# Patient Record
Sex: Male | Born: 1950 | Race: White | Hispanic: No | Marital: Married | Smoking: Former smoker
Health system: Southern US, Community
[De-identification: ages and names within clinical notes are randomized; demographics above are authoritative.]

## PROBLEM LIST (undated history)

## (undated) DIAGNOSIS — G8929 Other chronic pain: Secondary | ICD-10-CM

## (undated) DIAGNOSIS — I2510 Atherosclerotic heart disease of native coronary artery without angina pectoris: Secondary | ICD-10-CM

## (undated) DIAGNOSIS — M545 Low back pain: Secondary | ICD-10-CM

## (undated) DIAGNOSIS — R011 Cardiac murmur, unspecified: Secondary | ICD-10-CM

## (undated) DIAGNOSIS — I739 Peripheral vascular disease, unspecified: Secondary | ICD-10-CM

## (undated) DIAGNOSIS — J309 Allergic rhinitis, unspecified: Secondary | ICD-10-CM

## (undated) DIAGNOSIS — R310 Gross hematuria: Secondary | ICD-10-CM

## (undated) DIAGNOSIS — K5730 Diverticulosis of large intestine without perforation or abscess without bleeding: Secondary | ICD-10-CM

## (undated) DIAGNOSIS — R269 Unspecified abnormalities of gait and mobility: Secondary | ICD-10-CM

## (undated) DIAGNOSIS — R1084 Generalized abdominal pain: Secondary | ICD-10-CM

## (undated) DIAGNOSIS — I10 Essential (primary) hypertension: Secondary | ICD-10-CM

## (undated) DIAGNOSIS — Z86010 Personal history of colonic polyps: Secondary | ICD-10-CM

## (undated) DIAGNOSIS — F17200 Nicotine dependence, unspecified, uncomplicated: Secondary | ICD-10-CM

## (undated) DIAGNOSIS — R51 Headache: Secondary | ICD-10-CM

## (undated) DIAGNOSIS — G603 Idiopathic progressive neuropathy: Secondary | ICD-10-CM

## (undated) DIAGNOSIS — M1711 Unilateral primary osteoarthritis, right knee: Secondary | ICD-10-CM

## (undated) DIAGNOSIS — G5602 Carpal tunnel syndrome, left upper limb: Secondary | ICD-10-CM

## (undated) DIAGNOSIS — E785 Hyperlipidemia, unspecified: Secondary | ICD-10-CM

## (undated) DIAGNOSIS — M549 Dorsalgia, unspecified: Secondary | ICD-10-CM

## (undated) DIAGNOSIS — T7840XA Allergy, unspecified, initial encounter: Secondary | ICD-10-CM

## (undated) HISTORY — DX: Dorsalgia, unspecified: M54.9

## (undated) HISTORY — PX: OTHER SURGICAL HISTORY: SHX169

## (undated) HISTORY — PX: POLYPECTOMY: SHX149

## (undated) HISTORY — DX: Diverticulosis of large intestine without perforation or abscess without bleeding: K57.30

## (undated) HISTORY — DX: Generalized abdominal pain: R10.84

## (undated) HISTORY — DX: Low back pain: M54.5

## (undated) HISTORY — PX: COLONOSCOPY W/ BIOPSIES AND POLYPECTOMY: SHX1376

## (undated) HISTORY — DX: Nicotine dependence, unspecified, uncomplicated: F17.200

## (undated) HISTORY — DX: Unilateral primary osteoarthritis, right knee: M17.11

## (undated) HISTORY — DX: Idiopathic progressive neuropathy: G60.3

## (undated) HISTORY — DX: Essential (primary) hypertension: I10

## (undated) HISTORY — DX: Unspecified abnormalities of gait and mobility: R26.9

## (undated) HISTORY — DX: Other chronic pain: G89.29

## (undated) HISTORY — DX: Peripheral vascular disease, unspecified: I73.9

## (undated) HISTORY — DX: Atherosclerotic heart disease of native coronary artery without angina pectoris: I25.10

## (undated) HISTORY — DX: Gross hematuria: R31.0

## (undated) HISTORY — DX: Allergic rhinitis, unspecified: J30.9

## (undated) HISTORY — DX: Allergy, unspecified, initial encounter: T78.40XA

## (undated) HISTORY — PX: COLONOSCOPY: SHX174

## (undated) HISTORY — DX: Personal history of colonic polyps: Z86.010

## (undated) HISTORY — DX: Hyperlipidemia, unspecified: E78.5

## (undated) HISTORY — PX: CARDIAC CATHETERIZATION: SHX172

---

## 2000-02-15 ENCOUNTER — Encounter: Payer: Self-pay | Admitting: Emergency Medicine

## 2000-02-15 ENCOUNTER — Emergency Department (HOSPITAL_COMMUNITY): Admission: EM | Admit: 2000-02-15 | Discharge: 2000-02-15 | Payer: Self-pay

## 2000-02-26 ENCOUNTER — Emergency Department (HOSPITAL_COMMUNITY): Admission: EM | Admit: 2000-02-26 | Discharge: 2000-02-26 | Payer: Self-pay | Admitting: Emergency Medicine

## 2002-12-09 ENCOUNTER — Ambulatory Visit (HOSPITAL_COMMUNITY): Admission: RE | Admit: 2002-12-09 | Discharge: 2002-12-09 | Payer: Self-pay | Admitting: *Deleted

## 2002-12-09 ENCOUNTER — Encounter: Payer: Self-pay | Admitting: *Deleted

## 2004-04-23 ENCOUNTER — Ambulatory Visit (HOSPITAL_COMMUNITY): Admission: RE | Admit: 2004-04-23 | Discharge: 2004-04-24 | Payer: Self-pay | Admitting: Neurosurgery

## 2005-04-07 ENCOUNTER — Emergency Department (HOSPITAL_COMMUNITY): Admission: EM | Admit: 2005-04-07 | Discharge: 2005-04-07 | Payer: Self-pay | Admitting: Emergency Medicine

## 2006-01-23 ENCOUNTER — Ambulatory Visit: Payer: Self-pay | Admitting: Internal Medicine

## 2006-01-28 ENCOUNTER — Ambulatory Visit: Payer: Self-pay | Admitting: Internal Medicine

## 2006-02-19 ENCOUNTER — Ambulatory Visit: Payer: Self-pay | Admitting: Internal Medicine

## 2006-03-05 ENCOUNTER — Ambulatory Visit: Payer: Self-pay | Admitting: Internal Medicine

## 2006-03-05 ENCOUNTER — Encounter (INDEPENDENT_AMBULATORY_CARE_PROVIDER_SITE_OTHER): Payer: Self-pay | Admitting: *Deleted

## 2006-03-05 LAB — HM COLONOSCOPY: HM Colonoscopy: ABNORMAL

## 2006-04-10 ENCOUNTER — Encounter: Admission: RE | Admit: 2006-04-10 | Discharge: 2006-04-10 | Payer: Self-pay | Admitting: Neurosurgery

## 2006-11-15 ENCOUNTER — Ambulatory Visit (HOSPITAL_COMMUNITY): Admission: EM | Admit: 2006-11-15 | Discharge: 2006-11-15 | Payer: Self-pay | Admitting: Emergency Medicine

## 2007-06-09 ENCOUNTER — Encounter: Admission: RE | Admit: 2007-06-09 | Discharge: 2007-06-09 | Payer: Self-pay | Admitting: Neurosurgery

## 2007-06-24 ENCOUNTER — Inpatient Hospital Stay (HOSPITAL_COMMUNITY): Admission: RE | Admit: 2007-06-24 | Discharge: 2007-06-27 | Payer: Self-pay | Admitting: Neurosurgery

## 2008-04-04 ENCOUNTER — Ambulatory Visit (HOSPITAL_COMMUNITY): Admission: RE | Admit: 2008-04-04 | Discharge: 2008-04-04 | Payer: Self-pay | Admitting: Neurosurgery

## 2008-07-18 ENCOUNTER — Ambulatory Visit: Payer: Self-pay | Admitting: Internal Medicine

## 2008-07-18 DIAGNOSIS — Z860100 Personal history of colon polyps, unspecified: Secondary | ICD-10-CM

## 2008-07-18 DIAGNOSIS — E785 Hyperlipidemia, unspecified: Secondary | ICD-10-CM | POA: Insufficient documentation

## 2008-07-18 DIAGNOSIS — I2510 Atherosclerotic heart disease of native coronary artery without angina pectoris: Secondary | ICD-10-CM | POA: Insufficient documentation

## 2008-07-18 DIAGNOSIS — J309 Allergic rhinitis, unspecified: Secondary | ICD-10-CM

## 2008-07-18 DIAGNOSIS — R310 Gross hematuria: Secondary | ICD-10-CM | POA: Insufficient documentation

## 2008-07-18 DIAGNOSIS — K5730 Diverticulosis of large intestine without perforation or abscess without bleeding: Secondary | ICD-10-CM | POA: Insufficient documentation

## 2008-07-18 DIAGNOSIS — R1084 Generalized abdominal pain: Secondary | ICD-10-CM

## 2008-07-18 HISTORY — DX: Gross hematuria: R31.0

## 2008-07-18 HISTORY — DX: Diverticulosis of large intestine without perforation or abscess without bleeding: K57.30

## 2008-07-18 HISTORY — DX: Allergic rhinitis, unspecified: J30.9

## 2008-07-18 HISTORY — DX: Generalized abdominal pain: R10.84

## 2008-07-18 HISTORY — DX: Atherosclerotic heart disease of native coronary artery without angina pectoris: I25.10

## 2008-07-18 HISTORY — DX: Personal history of colon polyps, unspecified: Z86.0100

## 2008-07-18 HISTORY — DX: Personal history of colonic polyps: Z86.010

## 2008-07-18 HISTORY — DX: Hyperlipidemia, unspecified: E78.5

## 2008-07-19 ENCOUNTER — Ambulatory Visit: Payer: Self-pay | Admitting: Cardiology

## 2008-07-19 ENCOUNTER — Ambulatory Visit: Payer: Self-pay | Admitting: Internal Medicine

## 2008-07-19 LAB — CONVERTED CEMR LAB
ALT: 14 units/L (ref 0–53)
AST: 14 units/L (ref 0–37)
Albumin: 3.4 g/dL — ABNORMAL LOW (ref 3.5–5.2)
Alkaline Phosphatase: 48 units/L (ref 39–117)
BUN: 17 mg/dL (ref 6–23)
Bacteria, UA: NEGATIVE
Basophils Absolute: 0.1 10*3/uL (ref 0.0–0.1)
Basophils Relative: 0.8 % (ref 0.0–3.0)
Bilirubin Urine: NEGATIVE
Bilirubin, Direct: 0.1 mg/dL (ref 0.0–0.3)
CO2: 29 meq/L (ref 19–32)
Calcium: 8.9 mg/dL (ref 8.4–10.5)
Chloride: 107 meq/L (ref 96–112)
Creatinine, Ser: 0.9 mg/dL (ref 0.4–1.5)
Crystals: NEGATIVE
Eosinophils Absolute: 0.2 10*3/uL (ref 0.0–0.7)
Eosinophils Relative: 1.8 % (ref 0.0–5.0)
GFR calc Af Amer: 112 mL/min
GFR calc non Af Amer: 93 mL/min
Glucose, Bld: 100 mg/dL — ABNORMAL HIGH (ref 70–99)
HCT: 40.9 % (ref 39.0–52.0)
Hemoglobin, Urine: NEGATIVE
Hemoglobin: 14.1 g/dL (ref 13.0–17.0)
Ketones, ur: NEGATIVE mg/dL
Leukocytes, UA: NEGATIVE
Lymphocytes Relative: 26.4 % (ref 12.0–46.0)
MCHC: 34.5 g/dL (ref 30.0–36.0)
MCV: 87.3 fL (ref 78.0–100.0)
Monocytes Absolute: 1 10*3/uL (ref 0.1–1.0)
Monocytes Relative: 11.6 % (ref 3.0–12.0)
Mucus, UA: NEGATIVE
Neutro Abs: 5 10*3/uL (ref 1.4–7.7)
Neutrophils Relative %: 59.4 % (ref 43.0–77.0)
Nitrite: NEGATIVE
Platelets: 216 10*3/uL (ref 150–400)
Potassium: 4 meq/L (ref 3.5–5.1)
RBC / HPF: NONE SEEN
RBC: 4.68 M/uL (ref 4.22–5.81)
RDW: 13.3 % (ref 11.5–14.6)
Sodium: 141 meq/L (ref 135–145)
Specific Gravity, Urine: 1.025 (ref 1.000–1.03)
Total Bilirubin: 0.7 mg/dL (ref 0.3–1.2)
Total Protein, Urine: NEGATIVE mg/dL
Total Protein: 6.5 g/dL (ref 6.0–8.3)
Urine Glucose: NEGATIVE mg/dL
Urobilinogen, UA: 0.2 (ref 0.0–1.0)
WBC: 8.5 10*3/uL (ref 4.5–10.5)
pH: 5.5 (ref 5.0–8.0)

## 2008-08-18 ENCOUNTER — Telehealth (INDEPENDENT_AMBULATORY_CARE_PROVIDER_SITE_OTHER): Payer: Self-pay | Admitting: *Deleted

## 2009-04-06 ENCOUNTER — Ambulatory Visit: Payer: Self-pay | Admitting: Cardiology

## 2009-04-06 DIAGNOSIS — Z720 Tobacco use: Secondary | ICD-10-CM | POA: Insufficient documentation

## 2009-04-06 DIAGNOSIS — F17200 Nicotine dependence, unspecified, uncomplicated: Secondary | ICD-10-CM

## 2009-04-06 HISTORY — DX: Nicotine dependence, unspecified, uncomplicated: F17.200

## 2009-04-06 LAB — CONVERTED CEMR LAB
BUN: 21 mg/dL (ref 6–23)
Basophils Absolute: 0.1 10*3/uL (ref 0.0–0.1)
Basophils Relative: 0.8 % (ref 0.0–3.0)
CO2: 28 meq/L (ref 19–32)
Calcium: 9.4 mg/dL (ref 8.4–10.5)
Chloride: 106 meq/L (ref 96–112)
Creatinine, Ser: 1 mg/dL (ref 0.4–1.5)
Eosinophils Absolute: 0.1 10*3/uL (ref 0.0–0.7)
Eosinophils Relative: 0.9 % (ref 0.0–5.0)
GFR calc non Af Amer: 81.94 mL/min (ref >60)
Glucose, Bld: 91 mg/dL (ref 70–99)
HCT: 44.3 % (ref 39.0–52.0)
Hemoglobin: 15.4 g/dL (ref 13.0–17.0)
INR: 1 (ref 0.8–1.0)
Lymphocytes Relative: 23.9 % (ref 12.0–46.0)
Lymphs Abs: 2.2 10*3/uL (ref 0.7–4.0)
MCHC: 34.7 g/dL (ref 30.0–36.0)
MCV: 87.5 fL (ref 78.0–100.0)
Monocytes Absolute: 0.8 10*3/uL (ref 0.1–1.0)
Monocytes Relative: 8.7 % (ref 3.0–12.0)
Neutro Abs: 6 10*3/uL (ref 1.4–7.7)
Neutrophils Relative %: 65.7 % (ref 43.0–77.0)
Platelets: 234 10*3/uL (ref 150.0–400.0)
Potassium: 4.6 meq/L (ref 3.5–5.1)
Prothrombin Time: 11.3 s (ref 10.9–13.3)
RBC: 5.07 M/uL (ref 4.22–5.81)
RDW: 12.9 % (ref 11.5–14.6)
Sodium: 140 meq/L (ref 135–145)
WBC: 9.2 10*3/uL (ref 4.5–10.5)
aPTT: 28.1 s (ref 21.7–28.8)

## 2009-04-11 ENCOUNTER — Ambulatory Visit (HOSPITAL_COMMUNITY): Admission: RE | Admit: 2009-04-11 | Discharge: 2009-04-11 | Payer: Self-pay | Admitting: Cardiology

## 2009-04-11 ENCOUNTER — Ambulatory Visit: Payer: Self-pay | Admitting: Cardiology

## 2009-05-10 ENCOUNTER — Encounter (INDEPENDENT_AMBULATORY_CARE_PROVIDER_SITE_OTHER): Payer: Self-pay | Admitting: *Deleted

## 2009-07-12 ENCOUNTER — Encounter: Admission: RE | Admit: 2009-07-12 | Discharge: 2009-07-12 | Payer: Self-pay | Admitting: Neurosurgery

## 2010-10-06 ENCOUNTER — Emergency Department: Payer: Self-pay | Admitting: Emergency Medicine

## 2011-01-03 NOTE — Assessment & Plan Note (Signed)
Summary: FELL YESTERDAY--L LOWER SIDE PAIN--$50---STC   Vital Signs:  Patient Profile:   60 Years Old Male Weight:      190 pounds Temp:     97.7 degrees F oral Pulse rate:   74 / minute BP sitting:   118 / 70  (left arm) Cuff size:   regular  Vitals Entered By: Jerilynn Mages (July 18, 2008 4:42 PM)                 Preventive Care Screening  Colonoscopy:    Date:  03/05/2006    Next Due:  03/2011    Results:  abnormal    Chief Complaint:  fell yesterday/L lower side pain.  History of Present Illness: unfortunately fell on hard object yesterday - feels like he may have a rib fx, has large contusion to the lower ribs on left side, and noticed transient gross hematuria last evening, that seems to have cleared today; pain significant, and wife asked him to come in; no SOB, weakness or dizziness    Updated Prior Medication List: ASPIRIN 81 MG  TABS (ASPIRIN) Take 1 tablet by mouth once a day HYDROCODONE-ACETAMINOPHEN 5-325 MG  TABS (HYDROCODONE-ACETAMINOPHEN) 1 - 2 by mouth q 6 hrs as needed pain  Current Allergies (reviewed today): No known allergies   Past Medical History:    Reviewed history and no changes required:       Hyperlipidemia       Coronary artery disease - non obstructive       hx of cervical disc disease       Allergic rhinitis       E.D.       Diverticulosis, colon       Colonic polyps, hx of - adenomatous 2007  Past Surgical History:    Reviewed history and no changes required:       s/p cervical disc disease x 2   Family History:    Reviewed history and no changes required:       brother with MI at 7 yo       multiple aunts and uncles with early heart disease       father died at 96 yo - MI  Social History:    Reviewed history and no changes required:       Scientist, research (medical) and wood salesman       Married       Former Smoker       Alcohol use-yes   Risk Factors:  Tobacco use:  quit Alcohol use:  yes  Colonoscopy History:     Date  of Last Colonoscopy:  03/05/2006    Results:  abnormal    Review of Systems       all otherwise negative    Physical Exam  General:     alert and well-developed.   Head:     normocephalic and atraumatic.   Eyes:     No corneal or conjunctival inflammation noted. EOMI. Perrla.  Ears:     bilat tm's red, sinus nontender Nose:     no external deformity and no nasal discharge.   Mouth:     Oral mucosa and oropharynx without lesions or exudates.  Neck:     supple and full ROM.   Lungs:     Normal respiratory effort, chest expands symmetrically. Lungs are clear to auscultation, no crackles or wheezes. Heart:     Normal rate and regular rhythm. S1 and S2 normal without gallop, murmur, click,  rub or other extra sounds. Abdomen:     Bowel sounds positive,abdomen soft and non-tender without masses, organomegaly or hernias noted Msk:     left lateral rib cage about t9-12 area tender with large contusion as well., no flank tender Extremities:     no edema, no ulcers     Impression & Recommendations:  Problem # 1:  GROSS HEMATURIA (ICD-599.71) will check ua and renal functin labs Orders: Radiology Referral (Radiology)   Problem # 2:  ABDOMINAL PAIN, GENERALIZED (ICD-789.07) mostly left sided - with a contusion - I suspect prob rib fx as well- tx with vicodin as needed - for CT in the AM Orders: Radiology Referral (Radiology)   Complete Medication List: 1)  Aspirin 81 Mg Tabs (Aspirin) .... Take 1 tablet by mouth once a day 2)  Hydrocodone-acetaminophen 5-325 Mg Tabs (Hydrocodone-acetaminophen) .Marland Kitchen.. 1 - 2 by mouth q 6 hrs as needed pain   Patient Instructions: 1)  Please go to the Lab in the basement for your blood tests tomorrow morning - for: 2)  BMP prior to visit, ICD-9:599.9 3)  Urine micro - 599.9 4)  CBC w/ Diff prior to visit, ICD-9: 599.9 5)  Hepatic Panel prior to visit, ICD-9: 789.07 6)  After the blood tests, come to the first floor to have the CT scan  arranged for tomorrow 7)  please do not eat or drink after midnight (medications are ok) 8)  Please take all new medications as prescribed 9)  Continue all medications that you may have been taking previously   Prescriptions: HYDROCODONE-ACETAMINOPHEN 5-325 MG  TABS (HYDROCODONE-ACETAMINOPHEN) 1 - 2 by mouth q 6 hrs as needed pain  #60 x 0   Entered and Authorized by:   [name]   Signed by:   [name] on 07/18/2008   Method used:   Print then Give to Patient   RxID:   [id number]  ]

## 2011-01-03 NOTE — Letter (Signed)
Summary: Appointment - Missed  [facility], Main Office  [address]   [place], [place] [address]   Phone: [phone]  Fax: [phone]     May 10, 2009 MRN: [id number]   Donald Roberts [address] [place], [place]  [address]   Dear Mr. [name],  Our records indicate you missed your appointment on  June 4,2010  with Dr.  Antoine Poche  It is very important that we reach you to reschedule this appointment. We look forward to participating in your health care needs. Please contact us at the number listed above at your earliest convenience to reschedule this appointment.     Sincerely,   Lorne Skeens  [facility] Scheduling Team

## 2011-01-03 NOTE — Progress Notes (Signed)
Summary: fluid in elbow  Phone Note Call from Patient Call back at Home Phone [phone]   Caller: [phone] Call For: [name] Summary of Call: per pt wife call want to know if dr [name] can drain his elbow which is has retain fluid Initial call taken by: Shelbie Proctor,  August 18, 2008 1:24 PM  Follow-up for Phone Call        sorry, I do not normally do this Follow-up by: [name],  August 18, 2008 1:26 PM  Additional Follow-up for Phone Call Additional follow up Details #1::        August 18, 2008 2:28 PM  inform pt wife made aware  Additional Follow-up by: Shelbie Proctor,  August 18, 2008 2:29 PM

## 2011-01-03 NOTE — Assessment & Plan Note (Signed)
Summary: Donald Roberts/chest pain/pt was last seen 05 by Dr. Andee Lineman  Medications Added PERCOCET 10-650 MG TABS (OXYCODONE-ACETAMINOPHEN) as needed      Allergies Added: NKDA  Primary Provider:  [name]  CC:  chest pain.  History of Present Illness: The patient is a 60 year old white male with a history of nonobstructive coronary disease. Catheterization in 2004 or that demonstrated ramus intermediate ostial 30% stenosis. The mid AV circumflex had 30% stenosis.  He returns now urging of his family having not been seen for several years. He does see Dr. Jonny Roberts routinely. He reports that he's been having chest discomfort. He said 3 significant episodes in the last 3 weeks. He describes some sternal chest discomfort radiating down his left arm. It happens at rest. It is 5/10 in intensity. He has not had these symptoms before. There is no associated nausea vomiting or diaphoresis. It is a heaviness. He goes away on its own. He has had this at rest. He has not noticed that he can bring it on with activity. He is not having any palpitations, presyncope or syncope. He has had no shortness of breath and denies PND or orthopnea.  The patient smokes cigarettes. I did not see a recent lipid profile and he doesn't think one has been drawn in the past couple of years. He said he was on Lipitor at one point in time but was told he didn't need this. He says he is active but he does like to eat "greasy" food.  Current Medications (verified): 1)  Percocet 10-650 Mg Tabs (Oxycodone-Acetaminophen) .... As Needed  Allergies (verified): No Known Drug Allergies  Past History:  Past Medical History:    Hyperlipidemia (borderline)    Coronary artery disease - non obstructive    Hx of cervical disc disease    Allergic rhinitis    E.D.    Diverticulosis, colon    Colonic polyps, hx of - adenomatous 2007  Past Surgical History:    Reviewed history from 04/05/2009 and no changes required:    s/p cervical disc  disease x 2  Family History:    Reviewed history from 04/05/2009 and no changes required:       Brother with MI at 18 yo       Multiple aunts and uncles with early heart disease       Father died at 3 yo - MI  Social History:    Scientist, research (medical) and Midwife    Married    Alcohol use-yes    Tobacco Use - Yes.     Smoking Status:  current  Review of Systems       Joint pain. Negative for all other systems.  Vital Signs:  Patient profile:   60 year old male Height:      70 inches Weight:      186 pounds BMI:     26.78 Pulse rate:   63 / minute Resp:     18 per minute BP sitting:   122 / 50  (right arm)  Vitals Entered By: Marrion Coy, CNA (Apr 06, 2009 11:18 AM)  Physical Exam  General:  Well developed, well nourished, in no acute distress. Head:  normocephalic and atraumatic Eyes:  PERRLA/EOM intact; conjunctiva and lids normal. Mouth:  Teeth, gums and palate normal. Oral mucosa normal. Neck:  Neck supple, no JVD. No masses, thyromegaly or abnormal cervical nodes. Chest Wall:  no deformities or breast masses noted Lungs:  Clear bilaterally to auscultation and  percussion. Abdomen:  Bowel sounds positive; abdomen soft and non-tender without masses, organomegaly, or hernias noted. No hepatosplenomegaly. Msk:  Back normal, normal gait. Muscle strength and tone normal. Extremities:  No clubbing or cyanosis. Neurologic:  Alert and oriented x 3. Skin:  Intact without lesions or rashes. Cervical Nodes:  no significant adenopathy Axillary Nodes:  no significant adenopathy Inguinal Nodes:  no significant adenopathy Psych:  Normal affect.   Detailed Cardiovascular Exam  Neck    Carotids: Carotids full and equal bilaterally without bruits.      Neck Veins: Normal, no JVD.  Normal, no JVD.  Normal, no JVD.    Heart    Inspection: no deformities or lifts noted.  no deformities or lifts noted.  no deformities or lifts noted.      Palpation: normal PMI with no thrills  palpable.  normal PMI with no thrills palpable.  normal PMI with no thrills palpable.      Auscultation: regular rate and rhythm, S1, S2 without murmurs, rubs, gallops, or clicks.  regular rate and rhythm, S1, S2 without murmurs, rubs, gallops, or clicks.  regular rate and rhythm, S1, S2 without murmurs, rubs, gallops, or clicks.    Vascular    Abdominal Aorta: no palpable masses, pulsations, or audible bruits.  no palpable masses, pulsations, or audible bruits.  no palpable masses, pulsations, or audible bruits.      Femoral Pulses: normal femoral pulses bilaterally.  normal femoral pulses bilaterally.  normal femoral pulses bilaterally.      Pedal Pulses: normal pedal pulses bilaterally.  normal pedal pulses bilaterally.  normal pedal pulses bilaterally.      Radial Pulses: normal radial pulses bilaterally.  normal radial pulses bilaterally.  normal radial pulses bilaterally.      Peripheral Circulation: no clubbing, cyanosis, or edema noted with normal capillary refill.  no clubbing, cyanosis, or edema noted with normal capillary refill.  no clubbing, cyanosis, or edema noted with normal capillary refill.     Impression & Recommendations:  Problem # 1:  CORONARY ARTERY DISEASE (ICD-414.00) The patient has chest pain consistent with unstable angina. The risk of obstructive coronary disease is high. Therefore, he will need cardiac catheterization. He started on aspirin. He is given sublingual nitroglycerin. He is instructed she presented to the emergency room if he has any further chest discomfort. Orders: TLB-BMP (Basic Metabolic Panel-BMET) (80048-METABOL) TLB-CBC Platelet - w/Differential (85025-CBCD> TLB-PTT (85730-PTTL) TLB-PT (Protime) (85610-PTP)  Problem # 2:  TOBACCO ABUSE (ICD-305.1) I gave a prescription for Chantix. (Greater than 3 minutes discussing smoking cessation.)  Problem # 3:  HYPERLIPIDEMIA (ICD-272.4) I will check a fasting lipid profile.  Other Orders: TLB-CBC  Platelet - w/Differential (85025-CBCD)  Patient Instructions: 1)  Your physician recommends that you return for lab work in: today 2)  Your physician discussed the risks, benefits and indications for preventive aspirin therapy. It is recommended that you start (or continue) taking 325 mg of aspirin a day. 3)  Your physician recommended you take 1 tablet (or 1 spray) under tongue at onset of chest pain; you may repeat every 5 minutes for up to 3 doses. If 3 or more doses are required, call 911 and proceed to the ER immediately. 4)  Your physician has recommended you make the following change in your medication:  start chantix to help stop smoking

## 2011-01-03 NOTE — Miscellaneous (Signed)
Summary: rx chantix sl ntg  Clinical Lists Changes  Medications: Added new medication of NITROGLYCERIN 0.4 MG SUBL (NITROGLYCERIN) place 1 one under tongue for chest pain, may take up to 3 tabs, if no relief call 911 - Signed Added new medication of CHANTIX STARTING MONTH PAK 0.5 MG X 11 & 1 MG X 42 TABS (VARENICLINE TARTRATE) as directed - Signed Added new medication of CHANTIX CONTINUING MONTH PAK 1 MG TABS (VARENICLINE TARTRATE) as directed - Signed Rx of NITROGLYCERIN 0.4 MG SUBL (NITROGLYCERIN) place 1 one under tongue for chest pain, may take up to 3 tabs, if no relief call 911;  #25 x prn;  Signed;  Entered by: Charolotte Capuchin, RN;  Authorized by: Rollene Rotunda, MD, [facility];  Method used: Print then Give to Patient Rx of CHANTIX STARTING MONTH PAK 0.5 MG X 11 & 1 MG X 42 TABS (VARENICLINE TARTRATE) as directed;  #1 x 0;  Signed;  Entered by: Charolotte Capuchin, RN;  Authorized by: Rollene Rotunda, MD, [facility];  Method used: [facility] to [facility] [facility] [address] [phone]*, [address] [place], [place], [place]  [id number], Ph: [phone] or [phone], Fax: [phone] Rx of NITROGLYCERIN 0.4 MG SUBL (NITROGLYCERIN) place 1 one under tongue for chest pain, may take up to 3 tabs, if no relief call 911;  #1 x 11;  Signed;  Entered by: Charolotte Capuchin, RN;  Authorized by: Rollene Rotunda, MD, [facility];  Method used: Electronically to [facility]  [facility] [address] [phone]*, [address] [place], [place], [place]  [address], Ph: [phone] or [phone], Fax: [phone] Rx of CHANTIX CONTINUING MONTH PAK 1 MG TABS (VARENICLINE TARTRATE) as directed;  #1 x 5;  Signed;  Entered by: Charolotte Capuchin, RN;  Authorized by: Rollene Rotunda, MD, [facility];  Method used: Electronically to [facility]  [address] [phone]*, [address], [place], [place], [place]  [id number], Ph: [phone] or [phone], Fax: [phone]    Prescriptions: CHANTIX CONTINUING MONTH PAK 1  MG TABS (VARENICLINE TARTRATE) as directed  #1 x 5   Entered by:   Charolotte Capuchin, RN   Authorized by:   Rollene Rotunda, MD, [facility]   Signed by:   Charolotte Capuchin, RN on 04/06/2009   Method used:   Electronically to        [facility]  [address] [phone]* (retail)       [address]       [place], [place]  [id number]       Ph: [phone] or [phone]       Fax: [phone]   RxID:   [id number] NITROGLYCERIN 0.4 MG SUBL (NITROGLYCERIN) place 1 one under tongue for chest pain, may take up to 3 tabs, if no relief call 911  #1 x 11   Entered by:   Charolotte Capuchin, RN   Authorized by:   Rollene Rotunda, MD, [facility]   Signed by:   Charolotte Capuchin, RN on 04/06/2009   Method used:   Electronically to        [facility]  [address] [phone]* (retail)       [address]       [place], [place]  [address]       Ph: [phone] or [phone]       Fax: [phone]   RxID:   [id number] CHANTIX STARTING MONTH PAK 0.5 MG X 11 & 1 MG X  42 TABS (VARENICLINE TARTRATE) as directed  #1 x 0   Entered by:   Charolotte Capuchin, RN   Authorized by:   Rollene Rotunda, MD, [facility]   Signed by:   Charolotte Capuchin, RN on 04/06/2009   Method used:   Electronically to        [facility] [phone]* (retail)       [address]       [place], [place]  [id number]       Ph: [phone] or [phone]       Fax: [phone]   RxID:   [id number] NITROGLYCERIN 0.4 MG SUBL (NITROGLYCERIN) place 1 one under tongue for chest pain, may take up to 3 tabs, if no relief call 911  #25 x prn   Entered by:   Charolotte Capuchin, RN   Authorized by:   Rollene Rotunda, MD, [facility]   Signed by:   Charolotte Capuchin, RN on 04/06/2009   Method used:   Print then Give to Patient   RxID:   [id number]

## 2011-04-16 NOTE — Discharge Summary (Signed)
NAMETUAN, TIPPIN NO.:  [id number]   MEDICAL RECORD NO.:  [id number]          PATIENT TYPE:  INP   LOCATION:  3008                         FACILITY:  [facility]   PHYSICIAN:  Hilda Lias, M.D.   DATE OF BIRTH:  09/15/1951   DATE OF ADMISSION:  06/24/2007  DATE OF DISCHARGE:                               DISCHARGE SUMMARY   Audio too short to transcribe (less than 5 seconds)           ______________________________  Hilda Lias, M.D.     EB/MEDQ  D:  06/26/2007  T:  06/26/2007  Job:  [id number]

## 2011-04-16 NOTE — Op Note (Signed)
NAMEBOSTYN, Donald Roberts NO.:  [id number]   MEDICAL RECORD NO.:  [id number]          PATIENT TYPE:  INP   LOCATION:  2899                         FACILITY:  [facility]   PHYSICIAN:  Hilda Lias, M.D.   DATE OF BIRTH:  1951/02/18   DATE OF PROCEDURE:  06/24/2007  DATE OF DISCHARGE:                               OPERATIVE REPORT   PREOPERATIVE DIAGNOSIS:  Degenerative disk disease L2-3, L3-4, L4-5,  with chronic back pain and bilateral L5 radiculopathy.   POSTOPERATIVE DIAGNOSIS:  Degenerative disk disease L2-3, L3-4, L4-5,  with chronic back pain and bilateral L5 radiculopathy.   PROCEDURE:  1. Bilateral 2, 3 and 4 laminectomy.  2. Bilateral 2-3, 3-4, 4-5 diskectomy.  3. Interbody fusion with cages.  4. Pedicle screws to L2 to L5.  5. Posterior arthrodesis.  6. Cell saver.  7. C-arm.   SURGEON:  Hilda Lias, M.D.   ASSISTANT:  Stefani Dama, M.D.   CLINICAL HISTORY:  Donald Roberts __________ for many years complaining of  back pain with radiation to both legs.  Lately, he is getting worse.  The x-rays show worsening of degenerative disk changes at the level of 2-  3, 3-4, and 4-5.  The patient wants to proceed with surgery.  The risks  were explained in the history and physical.   PROCEDURE IN DETAIL:  The patient was taken to the OR and after  extubation, he was positioned in a prone manner.  The back was cleansed  with DuraPrep.  A midline incision from L1 to L4-5 was made.  The  muscles were retracted all the way laterally until we were able to see  the lateral aspect of the facet.  Then with the stellate rongeur, we  removed the spinal process of 2, 3 and 4 as well as the lamina.  Then we  entered the disk space between L2 and L3.  It was quite narrow.  Incision was made and through a bilateral approach, total gross  diskectomy was removed, removing the end plates.  The same procedure was  done essentially at the level of 3-4 and 4-5.  Then interbody  cages of 8  x 26 at the level of 2-3 and 3-4 with autograft and BNP inside were  introduced, protecting the nerve root as well as the dura mater.  At the  level of 4-5, we introduced a 10 x 26 with the same autograft bone and  BNP.  Lateral views showed good position of the cages.  From then on  using the C-arm, first in AP view on lateral view, we brought the  pedicles of 2-3, 3-4 and 5.  We tapped the pedicles.  We had good open  with no violation of the end of the walls.  Then four screws of 6.5 x 45  were introduced in the 2-3, 4-5 bilaterally.  Again with the C-arm, we  have good position of the pedicle screws.  Nevertheless, using a nerve  hook, we looked at the medial aspect of the pedicle and they were  completely intact.  Good foraminotomy __________  the L2, L3, and L4  nerve root was achieved.  From then on, a rod was used to connect the  pedicle screws with caps in place.  A cross-link was used to prevent any  rotation.  Then the lateral aspect of the facets were drained and a mix  of BMP and autograft were used.  The dura mater was intact.  On the left, there was a small area of  arachnoid bulge which was taken care of with a simple stitch of Nurolon  and Tisseel.  Valsalva maneuver was negative.  From then on, the area  was irrigated and closed with Vicryl.           ______________________________  Hilda Lias, M.D.     EB/MEDQ  D:  06/24/2007  T:  06/24/2007  Job:  [id number]

## 2011-04-16 NOTE — Discharge Summary (Signed)
NAMEDODGER, SINNING                 ACCOUNT NO.:  [id number]   MEDICAL RECORD NO.:  [id number]          PATIENT TYPE:  INP   LOCATION:  3008                         FACILITY:  [facility]   PHYSICIAN:  Hilda Lias, M.D.   DATE OF BIRTH:  Mar 23, 1951   DATE OF ADMISSION:  06/24/2007  DATE OF DISCHARGE:  06/27/2007                               DISCHARGE SUMMARY   ADMISSION DIAGNOSIS:  Degenerative disk disease, L2-3, 3-4, 4-5.   FINAL DIAGNOSIS:  Degenerative disk disease, L2-3, 3-4, 4-5.   CLINICAL HISTORY:  The patient was admitted because of back pain that  radiates into both legs.  I have been following the patient for many  years, and he is getting worse.  X-rays showed degenerative disk disease  and surgery was advised.  Laboratory was normal.   COURSE IN THE HOSPITAL:  The patient was taken to surgery and diskectomy  and fusion from L2 down to L5 was done.  Today, he is doing great and is  ambulating with no complaints whatsoever.  He is being discharged  tomorrow morning, June 27, 2007.   CONDITION ON DISCHARGE:  Improved.   MEDICATIONS:  Percocet, diazepam, Neurontin.   DIET:  Regular.   ACTIVITY:  I stated to him that he is not to drive for at least three  weeks.   FOLLOWUP:  He will be seen by me in my office in four weeks or before.           ______________________________  Hilda Lias, M.D.     EB/MEDQ  D:  06/26/2007  T:  06/27/2007  Job:  [id number]

## 2011-04-16 NOTE — H&P (Signed)
NAMEMarland Kitchen  PARNELL, SPIELER NO.:  [id number]   MEDICAL RECORD NO.:  [id number]          PATIENT TYPE:  OIB   LOCATION:  2899                         FACILITY:  [facility]   PHYSICIAN:  Rollene Rotunda, MD, FACCDATE OF BIRTH:  May 08, 1951   DATE OF ADMISSION:  04/11/2009  DATE OF DISCHARGE:                              HISTORY & PHYSICAL   PRIMARY CARE PHYSICIAN:  Corwin Levins, MD   CARDIOLOGIST:  Rollene Rotunda, MD, [facility]   PROCEDURE:  Left heart catheterization/coronary arteriography.   INDICATIONS:  A patient with chest pain suggestive of unstable angina.  He had previous known nonobstructive disease and multiple ongoing risk  factors.   PROCEDURE NOTE:  Left heart catheterization was performed via the right  femoral artery.  The artery was cannulated using anterior wall puncture.  A #5-French arterial sheath was inserted via the modified Seldinger  technique.  Preformed Judkins and pigtail catheter were utilized.  The  patient tolerated the procedure well and left the lab in stable  condition.   RESULTS:  Hemodynamics:  LV 116/10, AO 111/57.   Coronaries:  Left main was normal.  The LAD had a mid long 30% stenosis.  The mid diagonal was large.  There was a proximal long 25% stenosis.  Circumflex was a large codominant vessel.  In the AV groove, there was  mid 25% stenosis.  Mid obtuse marginal was small with a mid 50% stenosis  in a superior branch.  There was a PDA that was moderate size with 25%  stenosis.  Right coronary artery was codominant.  There was a mid 40-50%  stenosis followed by a long 30% stenosis.  The PDA was moderate size  with mid 25% stenosis.   Left ventriculogram:  The left ventriculogram was obtained in the RAO  projection.  The EF was 65% with normal wall motion.   CONCLUSION:  Nonobstructive coronary artery disease.  Preserved ejection  fraction.  The plaque seems to be relatively stable compared with his  2004 catheterization.   PLAN:   I see no high-grade lesions that would be contributing to the  patient's symptoms.  However, he certainly is at risk for cardiovascular  events in the future with his nonobstructive disease, family history,  and ongoing tobacco.  However, I think he is committed to stopping smoking.  We can get him to  comply with very healthy lifestyle including improving his diet which is  not good.  I have had a long discussion with the patient and his wife  about this.      Rollene Rotunda, MD, [facility]  Electronically Signed     JH/MEDQ  D:  04/11/2009  T:  04/12/2009  Job:  [id number]

## 2011-04-16 NOTE — H&P (Signed)
NAMEEDMUNDO, TEDESCO NO.:  [id number]   MEDICAL RECORD NO.:  [id number]          PATIENT TYPE:  INP   LOCATION:  3172                         FACILITY:  [facility]   PHYSICIAN:  Hilda Lias, M.D.   DATE OF BIRTH:  05-22-1951   DATE OF ADMISSION:  06/24/2007  DATE OF DISCHARGE:                              HISTORY & PHYSICAL   Mr. Fann is a gentleman who about three years ago underwent anterior  cervical fusion.  I have been following him for many years complaining  of back pain with radiation to both legs and lately it is getting worse.  He had been seen in my office on several occasions, the latest was a few  days ago complaining of back pain with radiation to both legs,  difficulty getting around, getting out of bed, walking.  He is quite  miserable.  We repeated the MRI; we compared it with the previous one  and it showed that there is increase in the degenerative disc disease  between L2 down to L5.  Because of the finding, he wants to proceed with  surgery.   PAST MEDICAL HISTORY:  1. Anterior cervical fusion 1996.  2. He is not allergic to any medications.  3. He drinks socially; he smokes a pack a day.   FAMILY HISTORY:  Unremarkable.   REVIEW OF SYSTEMS:  Positive for back pain.   PHYSICAL EXAMINATION:  HEAD/NOSE/THROAT:  Normal.  NECK:  Normal.  LUNGS:  Clear.  CARDIOVASCULAR:  Heart sounds normal.  ABDOMEN:  Normal.  EXTREMITIES:  Normal pulses.  NEURO:  Mental status normal.  Cranial nerves normal.  Strength showed  that he had some weakness in dorsiflexion of the left leg.  There is  some atrophy of the left leg.  Sensation is normal.  He has a decreased  flexibility of the lumbar spine.  Straight leg raising is positive  bilateral.  Femoral stretch maneuver is positive bilaterally.   The MRI as well as the lumbar x-rays showed that he has degenerative  disc disease at the level of 2-3, 3-4, 4-5 which had been progressively  getting  worse.   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   RECOMMENDATIONS:  The patient is being admitted for surgery.  The  procedure will be decompression and fusion at the level L2 down to L5.  He knows about the risks such as infection, CSF leak, no improvement  whatsoever, need for further surgery, damage to the vessel of the  abdomen, failure of the hardware.  Patient was encouraged to stop  smoking.           ______________________________  Hilda Lias, M.D.     EB/MEDQ  D:  06/24/2007  T:  06/24/2007  Job:  [id number]

## 2011-04-19 NOTE — Consult Note (Signed)
NAMEELCHONON, MAXSON NO.:  [id number]   MEDICAL RECORD NO.:  [id number]          PATIENT TYPE:  INP   LOCATION:  1825                         FACILITY:  [facility]   PHYSICIAN:  Artist Pais. Weingold, M.D.DATE OF BIRTH:  1951-05-19   DATE OF CONSULTATION:  11/15/2006  DATE OF DISCHARGE:  11/15/2006                                 CONSULTATION   EMERGENCY ROOM CONSULTATION:   PHYSICIAN REQUESTING CONSULTATION:  Dr. Doug Sou   REASON FOR CONSULTATION:  Donald Roberts is a 60 year old right-hand  dominant male who accidentally got his left hand caught in a cement  mixer earlier today, November 15, 2006, presents to the emergency room  with significant injuries to the distal aspect of the left index and  left long finger with crush-avulsion-type injures.  He is 55.  He has no  known drug allergies.  He is right-hand dominant.  He is currently  taking no medications.  He has had back surgery x2 by Dr. Jeral Fruit here in  [place], has infrequent episodes of pain which are controlled by  antiinflammatories.  No recent hospitalizations or surgeries otherwise  noted.   FAMILY HISTORY:  Noncontributory.   SOCIAL HISTORY:  Smokes a pack a day, no significant alcohol use.   EXAMINATION:  GENERAL:  Very well-developed, well-nourished male,  pleasant, alert and oriented x3.  EXTREMITIES:  Examination of his upper extremity on the left, he has  crush-avulsion-type injuries to the index and long finger on his left  hand.  There is significant loss of tissue volarly as well as on the  long finger loss of the nail plate, index finger the nail plate is  intact, but the underlying distal phalanx is gone.  The amputated parts  are brought by the family which are significantly damaged and not re-  implantable.   X-rays shows a fracture transversally in the proximal third of the  distal phalanx of the index finger and the same on the long finger in  the mid-shaft area.   IMPRESSION:  A 60 year old male with significant crush injuries to his  nondominant left index and left long finger.   RECOMMENDATIONS:  At this point in time, we are going to take him to the  operating room for irrigation and debridement, and amputation as  necessary.  He understands the risks and benefits.  We will do this  emergently today, November 15, 2006.      Artist Pais Mina Marble, M.D.  Electronically Signed     MAW/MEDQ  D:  11/15/2006  T:  11/16/2006  Job:  [id number]

## 2011-04-19 NOTE — Op Note (Signed)
NAMEAIKAM, VINJE NO.:  [id number]   MEDICAL RECORD NO.:  [id number]          PATIENT TYPE:  INP   LOCATION:  1825                         FACILITY:  [facility]   PHYSICIAN:  Artist Pais. Weingold, M.D.DATE OF BIRTH:  Jun 01, 1951   DATE OF PROCEDURE:  11/15/2006  DATE OF DISCHARGE:  11/15/2006                               OPERATIVE REPORT   PREOPERATIVE DIAGNOSIS:  Crush avulsion injury to his left index and  left long finger.   POSTOPERATIVE DIAGNOSIS:  Crush avulsion injury to his left index and  left long finger.   PROCEDURE:  I&D, __________ with VY flaps, left index and left long  finger and advancement of volar skin to cover osseous and soft tissue  defects.   SURGEON:  Dairl Ponder, MD.   ASSISTANT:  None.   ANESTHESIA:  General.   TOURNIQUET TIME:  35 minutes.   COMPLICATIONS:  None.   DRAINS:  None.   OPERATIVE REPORT:  The patient was taken to the operating suite.  After  the induction of adequate general anesthesia, the left upper extremity  was prepped in the usual sterile fashion.  An Esmarch was used to  exsanguinate the limb.  Tourniquet was then inflated to 250 mmHg.  At  this point in time, the left index finger and left long finger were  approached surgically.  The left index finger had complete loss of the  bone underlying the nailbed.  The nailbed was gone.  The nail plate was  intact.  There was exposure of the FDP insertion, which was partially  damaged as well as gross contamination was debrided.  The nail plate was  removed and the volar skin was advanced up over the distal aspect of the  distal phalanx to cover the bone as well as the profundus insertion.  This was done using a combination of 4-0 nylon and 4-0 Vicryl.  The long  finger was treated similarly with maintenance of the proximal third of  the nail bed and underlying distal phalanx.  Again, the VY flap was  fashioned and closed with 4-0 nylon and 4-0 Vicryl  distally.  At the end  of the procedure, the patient had immediate nerve block of the wrist  with 0.25% Marcaine, 3 mL.  The patient was then dressed with Xeroform,  4 x 4s and Coban wrap.  The patient tolerated the procedures well.      Artist Pais Mina Marble, M.D.  Electronically Signed    MAW/MEDQ  D:  11/15/2006  T:  11/16/2006  Job:  [id number]

## 2011-04-19 NOTE — Cardiovascular Report (Signed)
NAMEMarland Kitchen  ENNIO, HOUP                           ACCOUNT NO.:  [id number]   MEDICAL RECORD NO.:  [id number]                   PATIENT TYPE:  OIB   LOCATION:  2899                                 FACILITY:  [facility]   PHYSICIAN:  Veneda Melter, M.D. [facility]               DATE OF BIRTH:  05-30-1951   DATE OF PROCEDURE:  12/09/2002  DATE OF DISCHARGE:                              CARDIAC CATHETERIZATION   PROCEDURES PERFORMED:  1. Left heart catheterization.  2. Left ventriculogram.  3. Selective coronary angiography.   DIAGNOSES:  1. Mild coronary artery disease by angiogram.  2. Normal left ventricular systolic function.   HISTORY:  The patient is a 60 year old gentleman who presents with  persistence of substernal chest discomfort.  This occurs with exertion or  stress, and with stress imaging study approximately three years ago showing  a small posterior defect considered diaphragmatic attenuation at that time.  Due to persistence of symptoms, he is referred for cardiac assessment.   TECHNIQUE:  Informed consent was obtained, patient brought to the cardiac  catheterization lab, and a 6 French sheath was placed in the right femoral  artery using the modified Seldinger technique. A 6 [place] and JR4  catheters were then used to engage the left and right coronary arteries, and  selective angiography performed in various projections using manual  injections of contrast. A 6 French pigtail catheter was then advanced to the  left ventricle, and left ventriculogram performed using power injections of  contrast.  At the termination of the case, the catheters and sheaths were  removed and manual pressure applied until adequate hemostasis was achieved.  The patient tolerated the procedure well and was transferred to the ward in  stable condition.   FINDINGS:  1. Left main trunk:  Large caliber vessel, angiographically normal.  2. LAD:  This is a medium caliber vessel that provides a trivial  first     diagonal branch in the proximal segment, and a larger second diagonal     branch in the mid section.  It then tapers significantly as it approaches     the apex.  The LAD has luminal disease.  3. Left circumflex artery:  Dominant.  This is a large caliber vessel that     provides three marginal branches and the posterior descending artery.     The left circumflex system has mild disease of 30% in the mid AV segment.     The branch vessels have luminal irregularities.  4. Ramus intermedius: This is a small caliber vessel with an ostial     narrowing of 30%.  5. Right coronary artery:  Nondominant.  This is a small caliber vessel that     provides an RV marginal branch.  There is a lesion in the mid section of     50%.   LEFT VENTRICULOGRAM:  Normal end-systolic and end-diastolic dimensions.  Overall, left ventricular function is well preserved, ejection fraction of  greater than 55%, no mitral regurgitation.  LV pressure is 100/0, aortic is  100/80, LVEDP equals 10.   ASSESSMENT AND PLAN:  The patient is a 60 year old gentleman with  noncritical coronary artery disease and well preserved left ventricular  function by angiogram. He has no evidence of myocardial bridging or spasm.  The  patient did have significant chest discomfort with injection of the left  coronary artery.  However, again, no pulmonary pathology was noted.  Aggressive medical therapy should be pursued and other causes of chest pain  investigated and finally consideration given towards treatment of neurogenic  cardiac pain.                                                 Veneda Melter, M.D. [facility]    NG/MEDQ  D:  12/09/2002  T:  12/09/2002  Job:  [id number]   cc:   Gregary Signs A. Everardo All, M.D. [facility]   Learta Codding, M.D. [facility]

## 2011-04-19 NOTE — H&P (Signed)
NAMEMarland Roberts  KEINO, PLACENCIA                           ACCOUNT NO.:  [id number]   MEDICAL RECORD NO.:  [id number]                   PATIENT TYPE:  OIB   LOCATION:  2899                                 FACILITY:  [facility]   PHYSICIAN:  Hilda Lias, M.D.                DATE OF BIRTH:  1951-10-22   DATE OF ADMISSION:  04/23/2004  DATE OF DISCHARGE:                                HISTORY & PHYSICAL   Mr. Helle is a gentleman who many years ago underwent anterior cervical  fusion of the level of C5-C6.  This was in 1996.  The patient had been doing  really well without any problem until a few weeks ago, when he developed  neck pain with radiation to the upper extremity, to the point that by the  time I saw him late part of the last week, he told me that he had been  unable to sleep for one week.  He is quite miserable.  He was to the point  of crying .  Because of the findings, we did an emergency MRI which showed a  huge ruptured disk at the level of C6-C7.  I offered surgery immediately,  but he wait until today.  I told him that his problem needed to be taken  care of as soon as possible.  I told him that I was more than willing to  come today to do his surgery including that today is my day off.  He denies  any problem with the lower extremity.  He has had some pain in his right  leg, but according to him the neck pain is so intense, he is forgetting  about the right leg pain.   PAST MEDICAL HISTORY:  Cervical fusion in 1996 .   SOCIAL HISTORY:  He drinks socially.  He smoke a pack a day.   FAMILY HISTORY:  Unremarkable.   REVIEW OF SYSTEMS:  Positive for neck pain, arm pain, and right leg pain.   PHYSICAL EXAMINATION:  EARS/NOSE/THROAT:  Normal.  NECK:  He has a scar anteriorly.  He is able to flex but extension is quite  painful.  LUNGS:  Clear.  HEART:  Sounds normal.  ABDOMEN:  Normal.  EXTREMITIES:  Normal pulses.  NEURO:  Mental status normal.  Cranial nerves normal.  Strength:   He has  weakness of the triceps.  He has a mild weakness of the biceps.  Coordination and gait normal.   The cervical spine x-ray shows some effusion at the level of C5-C6.  There  is a possibility of severe __________ but it is quite difficult to say.  C1-  C2 is normal.   The cervical MRI is quite impressive.  He seems to have a large herniated  disk at the level of C6-C7 with displacement of the spinal cord and  foraminal stenosis.  There is mild foraminal stenosis at the level  C5-C6.   CLINICAL IMPRESSION:  1. C6-C7 herniated disk.  2. Carpal tunnel syndrome bilaterally.  3. Lower back pain with right leg pain.   RECOMMENDATIONS:  The patient is being admitted for anterior cervical  diskectomy at the level of C6-C7.  The surgery will be done with Allograft  bone as  well as the plate. The risks including damage to the vocal cord  because  of the previous surgery, damage to the esophagus, damage to the trachea,  failure of the bone graft, __________ for the surgery.  We are going to  explore, if there is no scar tissue at the level of C5-C6, but if we can no  do that probably we can go ahead and put a plate close to the fusion at the  level C5-C6.                                                Hilda Lias, M.D.    EB/MEDQ  D:  04/23/2004  T:  04/24/2004  Job:  [id number]

## 2011-04-19 NOTE — Op Note (Signed)
NAMEMarland Kitchen  Donald Roberts, Donald Roberts                           ACCOUNT NO.:  [id number]   MEDICAL RECORD NO.:  [id number]                   PATIENT TYPE:  OIB   LOCATION:  3007                                 FACILITY:  [facility]   PHYSICIAN:  Hilda Lias, M.D.                DATE OF BIRTH:  [birth year]   DATE OF PROCEDURE:  04/23/2004  DATE OF DISCHARGE:  04/24/2004                                 OPERATIVE REPORT   PREOPERATIVE DIAGNOSIS:  C6-7 herniated disk with displacement of the cord,  status post C5-6 fusion in 1996.   POSTOPERATIVE DIAGNOSIS:  C6-7 herniated disk with displacement of the cord,  status post C5-6 fusion in 1996.   PROCEDURE:  Anterior C6-7 diskectomy with decompression of the spinal cord,  bilateral foraminotomy, interbody fusion with allograft, plate from C6 to  C7.  Microscope.   SURGEON:  Hilda Lias, M.D.   ASSISTANT:  Payton Doughty, M.D.   CLINICAL HISTORY:  This patient was admitted to the hospital because of neck  pain with radiation to both upper extremities.  The patient had been unable  to sleep for almost 10 days.  MRI shows a large herniated disk at the level  of C6-7 with displacement of the spinal cord.  The patient had previous  surgery at the level of C5-6.  The patient wanted to proceed with surgery as  soon as possible.  The risks were explained in the history and physical.   PROCEDURE:  The patient was taken to the OR and after intubation, which was  done in a neutral position, the neck was prepped with Betadine.  No pillow  was used underneath the shoulders and no traction.  This was done to prevent  any hyperextension.  Then an incision was made through the skin and  subcutaneous tissue.  We found quite a bit of scar tissue and dissection was  carried out all the way down until we found the spine.  X-ray showed that we  were at the level of C5, C6, C7.  The anterior ligament was opened.  We  brought the microscope into the area.  The patient has such  a big neck that  it was difficult to see anything except with the microscope.  After we  opened the ligament using the Kerrison punch as well as the curette and the  pituitary rongeurs, we were able to do a total diskectomy.  There was an  opening in the posterior ligament with fragment going in the midline and  also laterally.  The patient has quite a bit of osteophytes.  Decompression  of the spinal cord was achieved.  Having good foraminotomy and after we  found that the spinal cord was with plenty of space above and below,  allograft of 7 mm was inserted, followed by a plate using four screws.  Lateral C-spine was difficult to see, but indeed we can  see then the screws  and the plate were in good location.  Having done this, the area was  irrigated.  Because the patient had previously had surgery, we left a drain  into the area.  From then on the wound was closed with Vicryl and a Steri-  Strip.                                               Hilda Lias, M.D.    EB/MEDQ  D:  04/23/2004  T:  04/24/2004  Job:  [id number]

## 2011-04-19 NOTE — Op Note (Signed)
NAMEMarland Kitchen  RHYLEN, SHAHEEN                           ACCOUNT NO.:  [id number]   MEDICAL RECORD NO.:  [id number]                   PATIENT TYPE:  OIB   LOCATION:  2899                                 FACILITY:  [facility]   PHYSICIAN:  Hilda Lias, M.D.                DATE OF BIRTH:  1951/04/06   DATE OF PROCEDURE:  04/23/2004  DATE OF DISCHARGE:                                 OPERATIVE REPORT   ADDENDUM:   Please change the procedure to C6-C7 anterior cervical decompression of the  spinal cord with fusion, allograft and plate.  Microscope.                                               Hilda Lias, M.D.    EB/MEDQ  D:  04/23/2004  T:  04/24/2004  Job:  [id number]

## 2011-04-25 ENCOUNTER — Encounter: Payer: Self-pay | Admitting: Internal Medicine

## 2011-08-07 ENCOUNTER — Telehealth: Payer: Self-pay

## 2011-08-07 DIAGNOSIS — Z1289 Encounter for screening for malignant neoplasm of other sites: Secondary | ICD-10-CM

## 2011-08-07 DIAGNOSIS — Z0000 Encounter for general adult medical examination without abnormal findings: Secondary | ICD-10-CM

## 2011-08-07 NOTE — Telephone Encounter (Signed)
Put order in for labs. 

## 2011-09-16 LAB — CBC
HCT: 41.7
Hemoglobin: 14.6
MCHC: 35
MCV: 83.4
Platelets: 280
RBC: 5.01
RDW: 14
WBC: 10.5

## 2011-09-16 LAB — ABO/RH: ABO/RH(D): A POS

## 2011-09-16 LAB — TYPE AND SCREEN
ABO/RH(D): A POS
Antibody Screen: NEGATIVE

## 2011-09-29 ENCOUNTER — Encounter: Payer: Self-pay | Admitting: Internal Medicine

## 2011-09-29 DIAGNOSIS — Z0000 Encounter for general adult medical examination without abnormal findings: Secondary | ICD-10-CM | POA: Insufficient documentation

## 2011-09-30 ENCOUNTER — Other Ambulatory Visit (INDEPENDENT_AMBULATORY_CARE_PROVIDER_SITE_OTHER): Payer: Self-pay

## 2011-09-30 DIAGNOSIS — Z0000 Encounter for general adult medical examination without abnormal findings: Secondary | ICD-10-CM

## 2011-09-30 DIAGNOSIS — Z1289 Encounter for screening for malignant neoplasm of other sites: Secondary | ICD-10-CM

## 2011-09-30 LAB — LIPID PANEL
Cholesterol: 179 mg/dL (ref 0–200)
HDL: 46 mg/dL (ref >39.00)
LDL Cholesterol: 110 mg/dL — ABNORMAL HIGH (ref 0–99)
Total CHOL/HDL Ratio: 4
Triglycerides: 113 mg/dL (ref 0.0–149.0)
VLDL: 22.6 mg/dL (ref 0.0–40.0)

## 2011-09-30 LAB — URINALYSIS, ROUTINE W REFLEX MICROSCOPIC
Bilirubin Urine: NEGATIVE
Hgb urine dipstick: NEGATIVE
Ketones, ur: NEGATIVE
Leukocytes, UA: NEGATIVE
Nitrite: NEGATIVE
Specific Gravity, Urine: 1.015 (ref 1.000–1.030)
Total Protein, Urine: NEGATIVE
Urine Glucose: NEGATIVE
Urobilinogen, UA: 0.2 (ref 0.0–1.0)
pH: 6 (ref 5.0–8.0)

## 2011-09-30 LAB — BASIC METABOLIC PANEL
BUN: 17 mg/dL (ref 6–23)
CO2: 25 mEq/L (ref 19–32)
Calcium: 9.1 mg/dL (ref 8.4–10.5)
Chloride: 106 mEq/L (ref 96–112)
Creatinine, Ser: 0.9 mg/dL (ref 0.4–1.5)
GFR: 90.57 mL/min (ref >60.00)
Glucose, Bld: 92 mg/dL (ref 70–99)
Potassium: 4.6 mEq/L (ref 3.5–5.1)
Sodium: 139 mEq/L (ref 135–145)

## 2011-09-30 LAB — HEPATIC FUNCTION PANEL
ALT: 16 U/L (ref 0–53)
AST: 18 U/L (ref 0–37)
Albumin: 3.8 g/dL (ref 3.5–5.2)
Alkaline Phosphatase: 53 U/L (ref 39–117)
Bilirubin, Direct: 0.1 mg/dL (ref 0.0–0.3)
Total Bilirubin: 0.4 mg/dL (ref 0.3–1.2)
Total Protein: 7.2 g/dL (ref 6.0–8.3)

## 2011-09-30 LAB — CBC WITH DIFFERENTIAL/PLATELET
Basophils Absolute: 0 10*3/uL (ref 0.0–0.1)
Basophils Relative: 0.3 % (ref 0.0–3.0)
Eosinophils Absolute: 0.1 10*3/uL (ref 0.0–0.7)
Eosinophils Relative: 1.6 % (ref 0.0–5.0)
HCT: 45.4 % (ref 39.0–52.0)
Hemoglobin: 15.5 g/dL (ref 13.0–17.0)
Lymphocytes Relative: 20.5 % (ref 12.0–46.0)
Lymphs Abs: 1.9 10*3/uL (ref 0.7–4.0)
MCHC: 34.1 g/dL (ref 30.0–36.0)
MCV: 88.9 fl (ref 78.0–100.0)
Monocytes Absolute: 0.7 10*3/uL (ref 0.1–1.0)
Monocytes Relative: 7.7 % (ref 3.0–12.0)
Neutro Abs: 6.6 10*3/uL (ref 1.4–7.7)
Neutrophils Relative %: 69.9 % (ref 43.0–77.0)
Platelets: 242 10*3/uL (ref 150.0–400.0)
RBC: 5.11 Mil/uL (ref 4.22–5.81)
RDW: 14.2 % (ref 11.5–14.6)
WBC: 9.4 10*3/uL (ref 4.5–10.5)

## 2011-09-30 LAB — TSH: TSH: 0.82 u[IU]/mL (ref 0.35–5.50)

## 2011-09-30 LAB — PSA: PSA: 0.32 ng/mL (ref 0.10–4.00)

## 2011-10-01 ENCOUNTER — Ambulatory Visit (INDEPENDENT_AMBULATORY_CARE_PROVIDER_SITE_OTHER): Payer: BC Managed Care – PPO | Admitting: Internal Medicine

## 2011-10-01 ENCOUNTER — Encounter: Payer: Self-pay | Admitting: Internal Medicine

## 2011-10-01 VITALS — BP 120/62 | HR 55 | Temp 97.8°F | Ht 70.0 in | Wt 190.2 lb

## 2011-10-01 DIAGNOSIS — Z0000 Encounter for general adult medical examination without abnormal findings: Secondary | ICD-10-CM

## 2011-10-01 DIAGNOSIS — M549 Dorsalgia, unspecified: Secondary | ICD-10-CM

## 2011-10-01 DIAGNOSIS — G8929 Other chronic pain: Secondary | ICD-10-CM

## 2011-10-01 DIAGNOSIS — M1710 Unilateral primary osteoarthritis, unspecified knee: Secondary | ICD-10-CM

## 2011-10-01 DIAGNOSIS — M1711 Unilateral primary osteoarthritis, right knee: Secondary | ICD-10-CM

## 2011-10-01 DIAGNOSIS — E785 Hyperlipidemia, unspecified: Secondary | ICD-10-CM

## 2011-10-01 DIAGNOSIS — N529 Male erectile dysfunction, unspecified: Secondary | ICD-10-CM

## 2011-10-01 DIAGNOSIS — Z79899 Other long term (current) drug therapy: Secondary | ICD-10-CM

## 2011-10-01 HISTORY — DX: Unilateral primary osteoarthritis, right knee: M17.11

## 2011-10-01 HISTORY — DX: Other chronic pain: G89.29

## 2011-10-01 MED ORDER — VARDENAFIL HCL 20 MG PO TABS: 20.0000 mg | ORAL_TABLET | ORAL | Status: AC | PRN

## 2011-10-01 MED ORDER — ATORVASTATIN CALCIUM 10 MG PO TABS: 10.0000 mg | ORAL_TABLET | Freq: Every day | ORAL | Status: DC

## 2011-10-01 NOTE — Assessment & Plan Note (Signed)
Overall doing well, age appropriate education and counseling updated, referrals for preventative services and immunizations addressed, dietary and smoking counseling addressed, most recent labs and ECG reviewed.  I have personally reviewed and have noted: 1) the patient's medical and social history 2) The pt's use of alcohol, tobacco, and illicit drugs 3) The patient's current medications and supplements 4) Functional ability including ADL's, fall risk, home safety risk, hearing and visual impairment 5) Diet and physical activities 6) Evidence for depression or mood disorder 7) The patient's height, weight, and BMI have been recorded in the chart I have made referrals, and provided counseling and education based on review of the above Declines flu shot today

## 2011-10-01 NOTE — Patient Instructions (Signed)
Take all new medications as prescribed - the lipitor 10 mg per day, and the levitra as needed (remember it is less expensive at [facility] or [employer]) Continue all other medications as before Please return in 4 wks for LAB only:  Lipids and liver tests Please return in 1 year for your yearly visit, or sooner if needed, with Lab testing done 3-5 days before

## 2011-10-01 NOTE — Progress Notes (Signed)
Subjective:    Patient ID: Donald Roberts, male    DOB: 1951/09/02, 60 y.o.   MRN: [id number]  HPI  Here for wellness and f/u;  Overall doing ok;  Pt denies CP, worsening SOB, DOE, wheezing, orthopnea, PND, worsening LE edema, palpitations, dizziness or syncope.  Pt denies neurological change such as new Headache, facial or extremity weakness.  Pt denies polydipsia, polyuria, or low sugar symptoms. Pt states overall good compliance with treatment and medications, good tolerability, and trying to follow lower cholesterol diet.  Pt denies worsening depressive symptoms, suicidal ideation or panic. No fever, wt loss, night sweats, loss of appetite, or other constitutional symptoms.  Pt states good ability with ADL's, low fall risk, home safety reviewed and adequate, no significant changes in hearing or vision, and occasionally active with exercise. Still smoking, could not tolerate the chantix due to bad dreams.  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  Pt continues to have recurring LBP without change in severity, bowel or bladder change, fever, wt loss,  worsening LE pain/numbness/weakness, gait change or falls.  Also has recurring mild right knee pain with swelling every few wks or so, has seen ortho in the past with drainage of the knee. Also with HA with prior viagra use - needs change of med Past Medical History  Diagnosis Date  . Abdominal pain, generalized 07/18/2008  . ALLERGIC RHINITIS 07/18/2008  . COLONIC POLYPS, HX OF 07/18/2008  . CORONARY ARTERY DISEASE 07/18/2008  . DIVERTICULOSIS, COLON 07/18/2008  . Gross hematuria 07/18/2008  . HYPERLIPIDEMIA 07/18/2008  . TOBACCO ABUSE 04/06/2009   Past Surgical History  Procedure Date  . S/p cervical disease x 2     reports that he has been smoking.  He does not have any smokeless tobacco history on file. He reports that he drinks alcohol. His drug history not on file. family history includes  Heart disease in his other. No Known Allergies Current Outpatient Prescriptions on File Prior to Visit  Medication Sig Dispense Refill  . nitroGLYCERIN (NITROSTAT) 0.4 MG SL tablet Place 0.4 mg under the tongue every 5 (five) minutes as needed.        Marland Kitchen oxyCODONE-acetaminophen (PERCOCET) 10-650 MG per tablet as needed.        . varenicline (CHANTIX CONTINUING MONTH PAK) 1 MG tablet Use as directed       . varenicline (CHANTIX STARTING MONTH PAK) 0.5 MG X 11 & 1 MG X 42 tablet Use as directed        Review of Systems Review of Systems  Constitutional: Negative for diaphoresis, activity change, appetite change and unexpected weight change.  HENT: Negative for hearing loss, ear pain, facial swelling, mouth sores and neck stiffness.   Eyes: Negative for pain, redness and visual disturbance.  Respiratory: Negative for shortness of breath and wheezing.   Cardiovascular: Negative for chest pain and palpitations.  Gastrointestinal: Negative for diarrhea, blood in stool, abdominal distention and rectal pain.  Genitourinary: Negative for hematuria, flank pain and decreased urine volume.  Musculoskeletal: Negative for myalgias and joint swelling.  Skin: Negative for color change and wound.  Neurological: Negative for syncope and numbness.  Hematological: Negative for adenopathy.  Psychiatric/Behavioral: Negative for hallucinations, self-injury, decreased concentration and agitation.      Objective:   Physical Exam BP 120/62  Pulse 55  Temp(Src) 97.8 F (36.6 C) (Oral)  Ht 5\' 10"  (1.778 m)  Wt 190 lb 4  oz (86.297 kg)  BMI 27.30 kg/m2  SpO2 94% Physical Exam  VS noted Constitutional: Pt is oriented to person, place, and time. Appears well-developed and well-nourished.  HENT:  Head: Normocephalic and atraumatic.  Right Ear: External ear normal.  Left Ear: External ear normal.  Nose: Nose normal.  Mouth/Throat: Oropharynx is clear and moist.  Eyes: Conjunctivae and EOM are normal. Pupils  are equal, round, and reactive to light.  Neck: Normal range of motion. Neck supple. No JVD present. No tracheal deviation present.  Cardiovascular: Normal rate, regular rhythm, normal heart sounds and intact distal pulses.   Pulmonary/Chest: Effort normal and breath sounds normal.  Abdominal: Soft. Bowel sounds are normal. There is no tenderness.  Musculoskeletal: Normal range of motion. Exhibits no edema.  Lymphadenopathy:  Has no cervical adenopathy.  Neurological: Pt is alert and oriented to person, place, and time. Pt has normal reflexes. No cranial nerve deficit. Motor/sens/dtr intact Skin: Skin is warm and dry. No rash noted.  Psychiatric:  Has  normal mood and affect. Behavior is normal.  Right knee trace effusion, nontender, FROM Spine Nontender throughout    Assessment & Plan:

## 2011-10-01 NOTE — Assessment & Plan Note (Signed)
With strong FH early sudden death, and known noncritical CAD by cath;  Goal ldl < 70 - for lipitor 10 qd, f/u labs in 4 wks

## 2012-03-27 ENCOUNTER — Encounter: Payer: Self-pay | Admitting: Internal Medicine

## 2012-03-27 ENCOUNTER — Ambulatory Visit (INDEPENDENT_AMBULATORY_CARE_PROVIDER_SITE_OTHER): Payer: BC Managed Care – PPO | Admitting: Internal Medicine

## 2012-03-27 VITALS — BP 132/70 | HR 66 | Temp 97.3°F | Ht 70.0 in | Wt 191.2 lb

## 2012-03-27 DIAGNOSIS — M25561 Pain in right knee: Secondary | ICD-10-CM | POA: Insufficient documentation

## 2012-03-27 DIAGNOSIS — I2510 Atherosclerotic heart disease of native coronary artery without angina pectoris: Secondary | ICD-10-CM

## 2012-03-27 DIAGNOSIS — S61209A Unspecified open wound of unspecified finger without damage to nail, initial encounter: Secondary | ICD-10-CM | POA: Insufficient documentation

## 2012-03-27 DIAGNOSIS — M25569 Pain in unspecified knee: Secondary | ICD-10-CM

## 2012-03-27 MED ORDER — TRAMADOL HCL 50 MG PO TABS: 50.0000 mg | ORAL_TABLET | Freq: Four times a day (QID) | ORAL | Status: AC | PRN

## 2012-03-27 MED ORDER — DOXYCYCLINE HYCLATE 100 MG PO TABS: 100.0000 mg | ORAL_TABLET | Freq: Two times a day (BID) | ORAL | Status: AC

## 2012-03-27 MED ORDER — PREDNISONE 10 MG PO TABS: ORAL_TABLET | ORAL | Status: DC

## 2012-03-27 NOTE — Assessment & Plan Note (Addendum)
With small effusion, I suspect flare of OA, cant r/o gout and pt asking for steroid tx, does not want ortho at this time, though I think more likely to do better with cortisone injection

## 2012-03-27 NOTE — Patient Instructions (Addendum)
Please use neosporin and gauze daily to the left finger wound Take all new medications as prescribed - the antibiotic, as well as the pain medication and prednisone for the right knee Continue all other medications as before Please call for orthopedic referral if not improved in 3-5 days

## 2012-03-28 ENCOUNTER — Encounter: Payer: Self-pay | Admitting: Internal Medicine

## 2012-03-28 NOTE — Assessment & Plan Note (Signed)
stable overall by hx and exam, most recent data reviewed with pt, and pt to continue medical treatment as before  Lab Results  Component Value Date   WBC 9.4 09/30/2011   HGB 15.5 09/30/2011   HCT 45.4 09/30/2011   PLT 242.0 09/30/2011   GLUCOSE 92 09/30/2011   CHOL 179 09/30/2011   TRIG 113.0 09/30/2011   HDL 46.00 09/30/2011   LDLCALC 110* 09/30/2011   ALT 16 09/30/2011   AST 18 09/30/2011   NA 139 09/30/2011   K 4.6 09/30/2011   CL 106 09/30/2011   CREATININE 0.9 09/30/2011   BUN 17 09/30/2011   CO2 25 09/30/2011   TSH 0.82 09/30/2011   PSA 0.32 09/30/2011   INR 1.0 ratio 04/06/2009

## 2012-03-28 NOTE — Assessment & Plan Note (Signed)
Mild to mod, for antibx course,  to f/u any worsening symptoms or concerns 

## 2012-03-28 NOTE — Progress Notes (Signed)
  Subjective:    Patient ID: Donald Roberts, male    DOB: 1951/02/09, 62 y.o.   MRN: [id number]  HPI   Here to f/u with acute c/o 3 days onset mod to severe right knee pain/swelling without fever, trauma, twisting or hx of gout.  No LE pain/swelling below the knee.  Nothing makes better, walking makes worse.  No falls or giveaway. Has hx of right knee DJD, seemed to start suddenly about mid day.  Also incidentally with left first finger red,swelling tender mild without red streaks, fever after some sort of suspected insect bite 3 days ago, no drainage and has FROM finger joints.  Pt denies chest pain, increased sob or doe, wheezing, orthopnea, PND, increased LE swelling, palpitations, dizziness or syncope.  Pt denies new neurological symptoms such as new headache, or facial or extremity weakness or numbness  Pt denies polydipsia, polyuria  Past Medical History  Diagnosis Date  . Abdominal pain, generalized 07/18/2008  . ALLERGIC RHINITIS 07/18/2008  . COLONIC POLYPS, HX OF 07/18/2008  . CORONARY ARTERY DISEASE 07/18/2008  . DIVERTICULOSIS, COLON 07/18/2008  . Gross hematuria 07/18/2008  . HYPERLIPIDEMIA 07/18/2008  . TOBACCO ABUSE 04/06/2009  . Back pain, chronic 10/01/2011  . Degenerative arthritis of right knee 10/01/2011   Past Surgical History  Procedure Date  . S/p cervical disease x 2     reports that he has been smoking.  He does not have any smokeless tobacco history on file. He reports that he drinks alcohol. His drug history not on file. family history includes Heart disease in his other. Allergies  Allergen Reactions  . Viagra Other (See Comments)    headache   Current Outpatient Prescriptions on File Prior to Visit  Medication Sig Dispense Refill  . oxyCODONE-acetaminophen (PERCOCET) 10-650 MG per tablet as needed.        Marland Kitchen atorvastatin (LIPITOR) 10 MG tablet Take 1 tablet (10 mg total) by mouth daily.  90 tablet  3  . nitroGLYCERIN (NITROSTAT) 0.4 MG SL tablet Place 0.4 mg under the  tongue every 5 (five) minutes as needed.         Review of Systems Constitutional: Negative for diaphoresis and unexpected weight change.  HENT: Negative for drooling and tinnitus.   Eyes: Negative for photophobia and visual disturbance.  Respiratory: Negative for choking and stridor.   Gastrointestinal: Negative for vomiting and blood in stool.  Genitourinary: Negative for hematuria and decreased urine volume.   Skin: Negative for color change and wound. except for above Neurological: Negative for tremors and numbness.    Objective:   Physical Exam BP 132/70  Pulse 66  Temp(Src) 97.3 F (36.3 C) (Oral)  Ht 5\' 10"  (1.778 m)  Wt 191 lb 4 oz (86.75 kg)  BMI 27.44 kg/m2  SpO2 98% Physical Exam  VS noted, not ill appearing Constitutional: Pt appears well-developed and well-nourished.  Eyes: Conjunctivae and EOM are normal. Pupils are equal, round, and reactive to light.  Neck: Normal range of motion. Neck supple.  Cardiovascular: Normal rate and regular rhythm.   Pulmonary/Chest: Effort normal and breath sounds normal.  Neurological: Pt is alert. Not confused Skin: Skin is warm. No erythema. except for left first finger post aspect 1 cm area between dip/pip superfic wound with midl erythema, swelling, no drainage/ Psychiatric: Pt behavior is normal. Thought content normal.  Right knee with 1+ effusion, anteromed tender but FROM    Assessment & Plan:

## 2012-03-30 ENCOUNTER — Telehealth: Payer: Self-pay

## 2012-03-30 NOTE — Telephone Encounter (Signed)
Called the patient and his knee is a little better this AM. He would like to wait until the end of the week to make a decision on being referred to an ortho.

## 2012-03-30 NOTE — Telephone Encounter (Signed)
Message copied by Pincus Sanes on Mon Mar 30, 2012  8:54 AM ------      Message from: Corwin Levins      Created: Sat Mar 28, 2012  7:05 PM      Regarding: is right knee better?       Please f/u with pt - if not improved, I think he should see orthopedic

## 2012-04-01 ENCOUNTER — Telehealth: Payer: Self-pay

## 2012-04-01 DIAGNOSIS — M25561 Pain in right knee: Secondary | ICD-10-CM

## 2012-04-01 DIAGNOSIS — M25461 Effusion, right knee: Secondary | ICD-10-CM

## 2012-04-01 NOTE — Telephone Encounter (Signed)
Done per emr 

## 2012-04-01 NOTE — Telephone Encounter (Signed)
The patient would like referral to ortho discussed at [facility].

## 2012-08-14 ENCOUNTER — Other Ambulatory Visit: Payer: Self-pay | Admitting: Cardiology

## 2012-08-14 DIAGNOSIS — E785 Hyperlipidemia, unspecified: Secondary | ICD-10-CM

## 2012-08-14 DIAGNOSIS — J309 Allergic rhinitis, unspecified: Secondary | ICD-10-CM

## 2012-08-14 DIAGNOSIS — K5730 Diverticulosis of large intestine without perforation or abscess without bleeding: Secondary | ICD-10-CM

## 2012-08-14 DIAGNOSIS — I2510 Atherosclerotic heart disease of native coronary artery without angina pectoris: Secondary | ICD-10-CM

## 2012-08-14 DIAGNOSIS — Z86010 Personal history of colonic polyps: Secondary | ICD-10-CM

## 2012-08-14 NOTE — Progress Notes (Signed)
Donald Roberts    Date of visit:  08/14/2012 DOB:  1951-07-14    Age:  61 yrs. Medical record number:  [id number]     Account number:  [id number] Primary Care Provider: Corwin Levins ____________________________ CURRENT DIAGNOSES  1. CAD,Native  2. Hyperlipidemia ____________________________ ALLERGIES  NKDA ____________________________ MEDICATIONS  1. Aspirin Childrens 81 mg tablet, chewable, 1 p.o. daily  2. Pravachol 40 mg tablet, 1 p.o. daily  3. ramipril 5 mg capsule, 1 p.o. daily  4. nitroglycerin 0.4 mg tablet, sublingual, take as directed  5. Percocet 5-325 mg tablet, PRN ____________________________ CHIEF COMPLAINTS  Followup of CAD,Native ____________________________ HISTORY OF PRESENT ILLNESS  Patient seen for cardiac followup. He has been feeling well since he was here without recurrent chest pain. He complains of occasional mild indigestion but is now resolved after he has been taking over-the-counter Pepcid. He is following a good diet and has been able to lose about 11 pounds of weight. His lipid panel was checked today and show much better control of his lipids.  ____________________________ PAST HISTORY  Past Medical Illnesses:  hyperlipidemia;  Cardiovascular Illnesses:  CAD;  Surgical Procedures:  cervical laminectomy x 2, lumbar laminectomy;  Cardiology Procedures-Invasive:  cardiac cath (left) May 2010;  Cardiology Procedures-Noninvasive:  treadmill cardiolite, lexiscan cardiolite June 2013;  Cardiac Cath Results:  normal Left main, 30% stenosis LAD, 20 % stenosis CFX, 50% stenosis OM 1, 40% stenosis RCA;  LVEF of 60% documented via nuclear study on 05/28/2012 ____________________________ CARDIO-PULMONARY TEST DATES EKG Date:  05/21/2012;   Cardiac Cath Date:  04/11/2009;  Nuclear Study Date:  05/28/2012;   ____________________________ SOCIAL HISTORY Alcohol Use:  2 beer week;  Smoking:  used to smoke but quit 2013, 50 pack year history;  Diet:  regular diet;  Lifestyle:   married;  Exercise:  no regular exercise;  Occupation:  Airline pilot, [employer];  Residence:  lives with wife;   ____________________________ REVIEW OF SYSTEMS General:  denies recent weight change, fatique or change in exercise tolerance.Respiratory:  denies dyspnea, cough, wheezing or hemoptysis. Cardiovascular:  please review HPI  Abdominal:  denies dyspepsia, GI bleeding, constipation, or diarrhea  Genitourinary-Male:  nocturia, mild erectile dysfunction  Musculoskeletal:  chronic low back pain  Neurological:  denies headaches, stroke, or TIA ____________________________ PHYSICAL EXAMINATION VITAL SIGNS  Blood Pressure:  120/70 Sitting, Right arm, regular cuff  , 124/68 Standing, Right arm and regular cuff   Pulse:  72/min. Weight:  180.00 lbs. Height:  70"BMI: 26  Constitutional:  pleasant white male in no acute distress, mildly obese Skin:  warm and dry to touch, no apparent skin lesions, or masses noted. Head:  normocephalic, balding male hair pattern Neck:  supple, without massess. No JVD, thyromegaly or carotid bruits. Carotid upstroke normal. Chest:  normal symmetry, clear to auscultation and percussion. Cardiac:  regular rhythm, normal S1 and S2, No S3 or S4, no murmurs, gallops or rubs detected. Neurological:  no gross motor or sensory deficits noted, affect appropriate, oriented x3. ____________________________ MOST RECENT LIPID PANEL 08/14/12  CHOL TOTL 160 mg/dl, LDL 87 calc, HDL 48 mg/dl, TRIGLYCER 578 mg/dl and CHOL/HDL 3.3 (Calc) ____________________________ IMPRESSIONS/PLAN  1. Coronary artery disease treated medically with negative Cardiolite study 2. Hyperlipidemia currently at goal  Recommendations:  He has successfully been able to stop smoking and is also following a good diet and has lost weight. Recommended followup in one year. Call if problems. ____________________________ TODAYS ORDERS  1. Return Visit: 1 year  2. Lipid Panel: 1  year                         ____________________________ Cardiology Physician:  Darden Palmer MD [facility]

## 2012-08-27 ENCOUNTER — Encounter: Payer: Self-pay | Admitting: Internal Medicine

## 2012-10-01 ENCOUNTER — Encounter: Payer: BC Managed Care – PPO | Admitting: Internal Medicine

## 2012-12-25 ENCOUNTER — Other Ambulatory Visit (INDEPENDENT_AMBULATORY_CARE_PROVIDER_SITE_OTHER): Payer: No Typology Code available for payment source

## 2012-12-25 ENCOUNTER — Encounter: Payer: Self-pay | Admitting: Internal Medicine

## 2012-12-25 ENCOUNTER — Ambulatory Visit (INDEPENDENT_AMBULATORY_CARE_PROVIDER_SITE_OTHER): Payer: No Typology Code available for payment source | Admitting: Internal Medicine

## 2012-12-25 ENCOUNTER — Ambulatory Visit (INDEPENDENT_AMBULATORY_CARE_PROVIDER_SITE_OTHER)
Admission: RE | Admit: 2012-12-25 | Discharge: 2012-12-25 | Disposition: A | Discharge status: Home / Self Care | Payer: No Typology Code available for payment source | Source: Ambulatory Visit | Attending: Internal Medicine | Admitting: Internal Medicine

## 2012-12-25 VITALS — BP 140/78 | HR 60 | Temp 97.6°F | Ht 70.0 in | Wt 184.0 lb

## 2012-12-25 DIAGNOSIS — M25519 Pain in unspecified shoulder: Secondary | ICD-10-CM

## 2012-12-25 DIAGNOSIS — Z0000 Encounter for general adult medical examination without abnormal findings: Secondary | ICD-10-CM

## 2012-12-25 DIAGNOSIS — M545 Low back pain, unspecified: Secondary | ICD-10-CM

## 2012-12-25 DIAGNOSIS — M25511 Pain in right shoulder: Secondary | ICD-10-CM | POA: Insufficient documentation

## 2012-12-25 DIAGNOSIS — I2510 Atherosclerotic heart disease of native coronary artery without angina pectoris: Secondary | ICD-10-CM

## 2012-12-25 DIAGNOSIS — G8929 Other chronic pain: Secondary | ICD-10-CM

## 2012-12-25 HISTORY — DX: Low back pain, unspecified: M54.50

## 2012-12-25 LAB — CBC WITH DIFFERENTIAL/PLATELET
Basophils Absolute: 0 10*3/uL (ref 0.0–0.1)
Basophils Relative: 0.4 % (ref 0.0–3.0)
Eosinophils Absolute: 0.1 10*3/uL (ref 0.0–0.7)
Eosinophils Relative: 1.2 % (ref 0.0–5.0)
HCT: 40.3 % (ref 39.0–52.0)
Hemoglobin: 13.7 g/dL (ref 13.0–17.0)
Lymphocytes Relative: 23.7 % (ref 12.0–46.0)
Lymphs Abs: 2.1 10*3/uL (ref 0.7–4.0)
MCHC: 34.1 g/dL (ref 30.0–36.0)
MCV: 85.4 fl (ref 78.0–100.0)
Monocytes Absolute: 0.8 10*3/uL (ref 0.1–1.0)
Monocytes Relative: 8.4 % (ref 3.0–12.0)
Neutro Abs: 6 10*3/uL (ref 1.4–7.7)
Neutrophils Relative %: 66.3 % (ref 43.0–77.0)
Platelets: 246 10*3/uL (ref 150.0–400.0)
RBC: 4.72 Mil/uL (ref 4.22–5.81)
RDW: 13.6 % (ref 11.5–14.6)
WBC: 9 10*3/uL (ref 4.5–10.5)

## 2012-12-25 LAB — BASIC METABOLIC PANEL
BUN: 14 mg/dL (ref 6–23)
CO2: 28 mEq/L (ref 19–32)
Calcium: 9.7 mg/dL (ref 8.4–10.5)
Chloride: 103 mEq/L (ref 96–112)
Creatinine, Ser: 0.8 mg/dL (ref 0.4–1.5)
GFR: 104.64 mL/min (ref >60.00)
Glucose, Bld: 97 mg/dL (ref 70–99)
Potassium: 4.5 mEq/L (ref 3.5–5.1)
Sodium: 139 mEq/L (ref 135–145)

## 2012-12-25 LAB — LIPID PANEL
Cholesterol: 160 mg/dL (ref 0–200)
HDL: 44.3 mg/dL (ref >39.00)
LDL Cholesterol: 92 mg/dL (ref 0–99)
Total CHOL/HDL Ratio: 4
Triglycerides: 121 mg/dL (ref 0.0–149.0)
VLDL: 24.2 mg/dL (ref 0.0–40.0)

## 2012-12-25 LAB — HEPATIC FUNCTION PANEL
ALT: 26 U/L (ref 0–53)
AST: 22 U/L (ref 0–37)
Albumin: 4.1 g/dL (ref 3.5–5.2)
Alkaline Phosphatase: 43 U/L (ref 39–117)
Bilirubin, Direct: 0.1 mg/dL (ref 0.0–0.3)
Total Bilirubin: 0.5 mg/dL (ref 0.3–1.2)
Total Protein: 7.3 g/dL (ref 6.0–8.3)

## 2012-12-25 LAB — URINALYSIS, ROUTINE W REFLEX MICROSCOPIC
Bilirubin Urine: NEGATIVE
Hgb urine dipstick: NEGATIVE
Ketones, ur: NEGATIVE
Leukocytes, UA: NEGATIVE
Nitrite: NEGATIVE
Specific Gravity, Urine: 1.02 (ref 1.000–1.030)
Total Protein, Urine: NEGATIVE
Urine Glucose: NEGATIVE
Urobilinogen, UA: 0.2 (ref 0.0–1.0)
pH: 6.5 (ref 5.0–8.0)

## 2012-12-25 LAB — PSA: PSA: 0.71 ng/mL (ref 0.10–4.00)

## 2012-12-25 LAB — TSH: TSH: 0.64 u[IU]/mL (ref 0.35–5.50)

## 2012-12-25 MED ORDER — TADALAFIL 20 MG PO TABS: 20.0000 mg | ORAL_TABLET | Freq: Every day | ORAL | Status: DC | PRN

## 2012-12-25 NOTE — Patient Instructions (Addendum)
Please take all new medication as prescribed - the cialis Please continue all other medications as before, and refills have been done if requested. Please keep your appointments with your specialists as you have planned - cardiology Please go to the LAB in the Basement (turn left off the elevator) for the tests to be done today You will be contacted by phone if any changes need to be made immediately.  Otherwise, you will receive a letter about your results with an explanation, but please check with MyChart first. Thank you for enrolling in MyChart. Please follow the instructions below to securely access your online medical record. MyChart allows you to send messages to your doctor, view your test results, renew your prescriptions, schedule appointments, and more. To Log into My Chart online, please go by [employer] or [employer] to [email or web address], or download the MyChart App from the [employer] of [employer] .  Your Username is: [email or web address] Please send a practice Message on Mychart later today. Please return in 1 year for your yearly visit, or sooner if needed, with Lab testing done 3-5 days before

## 2012-12-25 NOTE — Assessment & Plan Note (Signed)
By hx and exam most c/w glenohum joint pain/prob DJD - for film, tylenol prn,  to f/u any worsening symptoms or concerns, also cant r/o element of right bicipital tendonitis

## 2012-12-25 NOTE — Progress Notes (Signed)
Subjective:    Patient ID: Donald Roberts, male    DOB: July 26, 1951, 62 y.o.   MRN: [id number]  HPI  Here for wellness and f/u;  Overall doing ok;  Pt denies CP, worsening SOB, DOE, wheezing, orthopnea, PND, worsening LE edema, palpitations, dizziness or syncope.  Pt denies neurological change such as new headache, facial or extremity weakness.  Pt denies polydipsia, polyuria, or low sugar symptoms. Pt states overall good compliance with treatment and medications, good tolerability, and has been trying to follow lower cholesterol diet.  Pt denies worsening depressive symptoms, suicidal ideation or panic. No fever, night sweats, wt loss, loss of appetite, or other constitutional symptoms.  Pt states good ability with ADL's, has low fall risk, home safety reviewed and adequate, no other significant changes in hearing or vision, and only occasionally active with exercise.  Has some recurrent pain with shoulder use, some anterior, mild overall, and assoc with decreased ROM.  Pt continues to have recurring LBP without change in severity, bowel or bladder change, fever, wt loss,  worsening LE pain/numbness/weakness, gait change or falls, cont's to follow with Dr Lyndal Rainbow.   Quit smoking last august, increased his activity, wt stable.  Last sl ntg use 5 yrs ago per pt.  Asks for cialis due to ongoing ED symtpoms Past Medical History  Diagnosis Date  . Abdominal pain, generalized 07/18/2008  . ALLERGIC RHINITIS 07/18/2008  . COLONIC POLYPS, HX OF 07/18/2008  . CORONARY ARTERY DISEASE 07/18/2008  . DIVERTICULOSIS, COLON 07/18/2008  . Gross hematuria 07/18/2008  . HYPERLIPIDEMIA 07/18/2008  . TOBACCO ABUSE 04/06/2009  . Back pain, chronic 10/01/2011  . Degenerative arthritis of right knee 10/01/2011  . Chronic low back pain 12/25/2012   Past Surgical History  Procedure Date  . S/p cervical disease x 2   . Lumbar surgury     dr botero/ns; 3 level fusion per pt    reports that he has been smoking.  He does not have  any smokeless tobacco history on file. He reports that he drinks alcohol. His drug history not on file. family history includes Heart disease in his other. Allergies  Allergen Reactions  . Sildenafil Citrate Other (See Comments)    headache   Current Outpatient Prescriptions on File Prior to Visit  Medication Sig Dispense Refill  . nitroGLYCERIN (NITROSTAT) 0.4 MG SL tablet Place 0.4 mg under the tongue every 5 (five) minutes as needed.        Marland Kitchen oxyCODONE-acetaminophen (PERCOCET) 10-650 MG per tablet as needed.        . pravastatin (PRAVACHOL) 40 MG tablet Take 40 mg by mouth daily.      . ramipril (ALTACE) 5 MG capsule Take 5 mg by mouth daily.      Marland Kitchen atorvastatin (LIPITOR) 10 MG tablet Take 1 tablet (10 mg total) by mouth daily.  90 tablet  3  . tadalafil (CIALIS) 20 MG tablet Take 1 tablet (20 mg total) by mouth daily as needed for erectile dysfunction.  10 tablet  11   Review of Systems Constitutional: Negative for diaphoresis, activity change, appetite change or unexpected weight change.  HENT: Negative for hearing loss, ear pain, facial swelling, mouth sores and neck stiffness.   Eyes: Negative for pain, redness and visual disturbance.  Respiratory: Negative for shortness of breath and wheezing.   Cardiovascular: Negative for chest pain and palpitations.  Gastrointestinal: Negative for diarrhea, blood in stool, abdominal distention or other pain Genitourinary: Negative for hematuria, flank pain or change  in urine volume.  Musculoskeletal: Negative for myalgias and joint swelling.  Skin: Negative for color change and wound.  Neurological: Negative for syncope and numbness. other than noted Hematological: Negative for adenopathy.  Psychiatric/Behavioral: Negative for hallucinations, self-injury, decreased concentration and agitation.      Objective:   Physical Exam BP 140/78  Pulse 60  Temp 97.6 F (36.4 C) (Oral)  Ht 5\' 10"  (1.778 m)  Wt 184 lb (83.462 kg)  BMI 26.40 kg/m2   SpO2 96% VS noted, not ill appaering Constitutional: Pt is oriented to person, place, and time. Appears well-developed and well-nourished.  Head: Normocephalic and atraumatic.  Right Ear: External ear normal.  Left Ear: External ear normal.  Nose: Nose normal.  Mouth/Throat: Oropharynx is clear and moist.  Eyes: Conjunctivae and EOM are normal. Pupils are equal, round, and reactive to light.  Neck: Normal range of motion. Neck supple. No JVD present. No tracheal deviation present.  Cardiovascular: Normal rate, regular rhythm, normal heart sounds and intact distal pulses.   Pulmonary/Chest: Effort normal and breath sounds normal.  Abdominal: Soft. Bowel sounds are normal. There is no tenderness. No HSM  Musculoskeletal: Normal range of motion. Exhibits no edema.  Lymphadenopathy:  Has no cervical adenopathy.  Neurological: Pt is alert and oriented to person, place, and time. Pt has normal reflexes. No cranial nerve deficit.  Skin: Skin is warm and dry. No rash noted.  Psychiatric:  Has  normal mood and affect. Behavior is normal.  Right shoulder with mild decr ROM to ext rotation, tender to right bicipital tendon insertion site Lumbar diffuse mild tender    Assessment & Plan:

## 2012-12-25 NOTE — Assessment & Plan Note (Signed)

## 2012-12-26 ENCOUNTER — Encounter: Payer: Self-pay | Admitting: Internal Medicine

## 2012-12-26 NOTE — Assessment & Plan Note (Signed)
stable overall by history and exam, recent data reviewed with pt, and pt to continue medical treatment as before,  to f/u any worsening symptoms or concerns, f/u NS as planned

## 2013-01-16 ENCOUNTER — Other Ambulatory Visit: Payer: Self-pay

## 2013-04-30 ENCOUNTER — Ambulatory Visit (INDEPENDENT_AMBULATORY_CARE_PROVIDER_SITE_OTHER): Payer: No Typology Code available for payment source | Admitting: Internal Medicine

## 2013-04-30 ENCOUNTER — Encounter: Payer: Self-pay | Admitting: Internal Medicine

## 2013-04-30 VITALS — BP 104/64 | HR 54 | Temp 97.0°F | Ht 70.0 in | Wt 186.8 lb

## 2013-04-30 DIAGNOSIS — M542 Cervicalgia: Secondary | ICD-10-CM | POA: Insufficient documentation

## 2013-04-30 DIAGNOSIS — E785 Hyperlipidemia, unspecified: Secondary | ICD-10-CM

## 2013-04-30 DIAGNOSIS — I2510 Atherosclerotic heart disease of native coronary artery without angina pectoris: Secondary | ICD-10-CM

## 2013-04-30 MED ORDER — TIZANIDINE HCL 4 MG PO TABS: 4.0000 mg | ORAL_TABLET | Freq: Four times a day (QID) | ORAL | Status: DC | PRN

## 2013-04-30 MED ORDER — TRAMADOL HCL 50 MG PO TABS: 50.0000 mg | ORAL_TABLET | Freq: Four times a day (QID) | ORAL | Status: DC | PRN

## 2013-04-30 MED ORDER — PREDNISONE 10 MG PO TABS: ORAL_TABLET | ORAL | Status: DC

## 2013-04-30 NOTE — Assessment & Plan Note (Signed)
Acute on chronic with flare related to heavy lifting, actually some improved in last 6 days, no neuro changes, no trauma or fever, ok to hold on imaging, and tx medically; he plans to leave country for [facility] for 3 wks soon but will try med tx first, consider f/u NS as he is s/p surgury for persistent pain or worsening neuro changes

## 2013-04-30 NOTE — Patient Instructions (Signed)
Please take all new medication as prescribed - the pain medication, muscle relaxer and prednisone (anti-inflammatory) Please call if pain, numbness or weakness get worse Please continue all other medications as before Have a Good time at [facility].

## 2013-04-30 NOTE — Progress Notes (Signed)
Subjective:    Patient ID: Donald Roberts, male    DOB: 1951-03-19, 61 y.o.   MRN: [id number]  HPI  Here with 1 wk onset left neck pain posterolat, sharp and burning, severe to start, now mild to mod but persistent so he is here, started after lifting more than 40 lbs at work (has been advised in past to not do this after previous c-spine surgury).  Fortunately Pt with no bowel or bladder change, fever, wt loss,  worsening UE or LE pain/numbness/weakness, gait change or falls. Worse to turn head to left horozintally, Nothing makes better including heating pad and other otc remedies.  Pt denies chest pain, increased sob or doe, wheezing, orthopnea, PND, increased LE swelling, palpitations, dizziness or syncope.   Pt denies polydipsia, polyuria.  Somewhat concerned about risk of heart trouble with the neck pain, and statin use causing pain Past Medical History  Diagnosis Date  . Abdominal pain, generalized 07/18/2008  . ALLERGIC RHINITIS 07/18/2008  . COLONIC POLYPS, HX OF 07/18/2008  . CORONARY ARTERY DISEASE 07/18/2008  . DIVERTICULOSIS, COLON 07/18/2008  . Gross hematuria 07/18/2008  . HYPERLIPIDEMIA 07/18/2008  . TOBACCO ABUSE 04/06/2009  . Back pain, chronic 10/01/2011  . Degenerative arthritis of right knee 10/01/2011  . Chronic low back pain 12/25/2012   Past Surgical History  Procedure Laterality Date  . S/p cervical disease x 2    . Lumbar surgury      dr botero/ns; 3 level fusion per pt    reports that he has been smoking.  He does not have any smokeless tobacco history on file. He reports that  drinks alcohol. His drug history is not on file. family history includes Heart disease in his other. Allergies  Allergen Reactions  . Sildenafil Citrate Other (See Comments)    headache   Current Outpatient Prescriptions on File Prior to Visit  Medication Sig Dispense Refill  . pravastatin (PRAVACHOL) 40 MG tablet Take 40 mg by mouth daily.      . ramipril (ALTACE) 5 MG capsule Take 5 mg by  mouth daily.       No current facility-administered medications on file prior to visit.   Review of Systems  Constitutional: Negative for unexpected weight change, or unusual diaphoresis  HENT: Negative for tinnitus.   Eyes: Negative for photophobia and visual disturbance.  Respiratory: Negative for choking and stridor.   Gastrointestinal: Negative for vomiting and blood in stool.  Genitourinary: Negative for hematuria and decreased urine volume.  Musculoskeletal: Negative for acute joint swelling Skin: Negative for color change and wound.  Neurological: Negative for tremors and numbness other than noted  Psychiatric/Behavioral: Negative for decreased concentration or  hyperactivity.       Objective:   Physical Exam BP 104/64  Pulse 54  Temp(Src) 97 F (36.1 C) (Oral)  Ht 5\' 10"  (1.778 m)  Wt 186 lb 12 oz (84.709 kg)  BMI 26.8 kg/m2  SpO2 98% VS noted,  Constitutional: Pt appears well-developed and well-nourished.  HENT: Head: NCAT.  Right Ear: External ear normal.  Left Ear: External ear normal.  Eyes: Conjunctivae and EOM are normal. Pupils are equal, round, and reactive to light.  Neck: Normal range of motion. Neck supple.but tender area left posterolat aspect and trapezoid  Cardiovascular: Normal rate and regular rhythm.   Pulmonary/Chest: Effort normal and breath sounds normal.  Neurological: Pt is alert. Not confused , motor/sens/dtr intact to UE's, grip normal Skin: Skin is warm. No erythema.  Psychiatric: Pt behavior  is normal. Thought content normal.     Assessment & Plan:

## 2013-05-01 NOTE — Assessment & Plan Note (Signed)
stable overall by history and exam, recent data reviewed with pt, and pt to continue medical treatment as before,  to f/u any worsening symptoms or concerns Lab Results  Component Value Date   LDLCALC 92 12/25/2012

## 2013-05-01 NOTE — Assessment & Plan Note (Signed)
stable overall by history and exam, recent data reviewed with pt, and pt to continue medical treatment as before,  to f/u any worsening symptoms or concerns Lab Results  Component Value Date   WBC 9.0 12/25/2012   HGB 13.7 12/25/2012   HCT 40.3 12/25/2012   PLT 246.0 12/25/2012   GLUCOSE 97 12/25/2012   CHOL 160 12/25/2012   TRIG 121.0 12/25/2012   HDL 44.30 12/25/2012   LDLCALC 92 12/25/2012   ALT 26 12/25/2012   AST 22 12/25/2012   NA 139 12/25/2012   K 4.5 12/25/2012   CL 103 12/25/2012   CREATININE 0.8 12/25/2012   BUN 14 12/25/2012   CO2 28 12/25/2012   TSH 0.64 12/25/2012   PSA 0.71 12/25/2012   INR 1.0 ratio 04/06/2009

## 2013-05-12 ENCOUNTER — Ambulatory Visit (INDEPENDENT_AMBULATORY_CARE_PROVIDER_SITE_OTHER): Payer: No Typology Code available for payment source | Admitting: Internal Medicine

## 2013-05-12 ENCOUNTER — Encounter: Payer: Self-pay | Admitting: Internal Medicine

## 2013-05-12 VITALS — BP 122/80 | HR 71 | Temp 97.0°F | Ht 70.0 in | Wt 185.4 lb

## 2013-05-12 DIAGNOSIS — M542 Cervicalgia: Secondary | ICD-10-CM

## 2013-05-12 MED ORDER — PREDNISONE 10 MG PO TABS: ORAL_TABLET | ORAL | Status: DC

## 2013-05-12 NOTE — Patient Instructions (Signed)
Please take all new medication as prescribed - the prednisone  Please continue all other medications as before, and refills have been done if requested.  Please call in 1-2 wks if not improved, as you may need MRI neck and see Dr Jeral Fruit  Thank you for enrolling in MyChart. Please follow the instructions below to securely access your online medical record. MyChart allows you to send messages to your doctor, view your test results, renew your prescriptions, schedule appointments, and more.

## 2013-05-12 NOTE — Progress Notes (Signed)
  Subjective:    Patient ID: Donald Roberts, male    DOB: 1951/09/30, 62 y.o.   MRN: [id number]  HPI  Here to f/u, Pt continues to have recurring left post lat neck pain and diffuse swelling, assoc with decreased ROM to turning head to left, better with the prednisone, then worse again, just back from [facility] yesterday, has been relaxing in his home there without further lifting or aggrevation, nothing seems to make better or worse, no bowel or bladder change, fever, wt loss,  worsening UE or LE pain/numbness/weakness, gait change or falls. Past Medical History  Diagnosis Date  . Abdominal pain, generalized 07/18/2008  . ALLERGIC RHINITIS 07/18/2008  . COLONIC POLYPS, HX OF 07/18/2008  . CORONARY ARTERY DISEASE 07/18/2008  . DIVERTICULOSIS, COLON 07/18/2008  . Gross hematuria 07/18/2008  . HYPERLIPIDEMIA 07/18/2008  . TOBACCO ABUSE 04/06/2009  . Back pain, chronic 10/01/2011  . Degenerative arthritis of right knee 10/01/2011  . Chronic low back pain 12/25/2012   Past Surgical History  Procedure Laterality Date  . S/p cervical disease x 2    . Lumbar surgury      dr botero/ns; 3 level fusion per pt    reports that he has been smoking.  He does not have any smokeless tobacco history on file. He reports that  drinks alcohol. His drug history is not on file. family history includes Heart disease in his other. Allergies  Allergen Reactions  . Sildenafil Citrate Other (See Comments)    headache   Current Outpatient Prescriptions on File Prior to Visit  Medication Sig Dispense Refill  . pravastatin (PRAVACHOL) 40 MG tablet Take 40 mg by mouth daily.      . ramipril (ALTACE) 5 MG capsule Take 5 mg by mouth daily.      Marland Kitchen tiZANidine (ZANAFLEX) 4 MG tablet Take 1 tablet (4 mg total) by mouth every 6 (six) hours as needed.  60 tablet  0  . traMADol (ULTRAM) 50 MG tablet Take 1 tablet (50 mg total) by mouth every 6 (six) hours as needed for pain.  60 tablet  1   No current facility-administered  medications on file prior to visit.   Review of Systems  All otherwise neg per pt     Objective:   Physical Exam BP 122/80  Pulse 71  Temp(Src) 97 F (36.1 C) (Oral)  Ht 5\' 10"  (1.778 m)  Wt 185 lb 6 oz (84.086 kg)  BMI 26.6 kg/m2  SpO2 98% VS noted,  Constitutional: Pt appears well-developed and well-nourished.  HENT: Head: NCAT.  Right Ear: External ear normal.  Left Ear: External ear normal.  Eyes: Conjunctivae and EOM are normal. Pupils are equal, round, and reactive to light.  Neck:  Neck supple. but with left post lat tension/tender/swelling area, but other trapezoid nontender, shoulders FROM, NT, but neck also with decrased ROM to horozontal turning head right  Cardiovascular: Normal rate and regular rhythm.   Pulmonary/Chest: Effort normal and breath sounds normal.  Neurological: Pt is alert. Not confused , motor 5/5, sens intact, gait intact Skin: Skin is warm. No erythema.  Psychiatric: Pt behavior is normal. Thought content normal.     Assessment & Plan:

## 2013-05-12 NOTE — Assessment & Plan Note (Signed)
S/p surgury per Dr Jeral Fruit prior, some better then worse again after lower dose predpack, no worsening neuro changes, so today for repeat higher dose predpack asd, consider MRI and/or surgical referral if not improved

## 2013-05-31 ENCOUNTER — Telehealth: Payer: Self-pay | Admitting: Internal Medicine

## 2013-05-31 DIAGNOSIS — M542 Cervicalgia: Secondary | ICD-10-CM

## 2013-05-31 NOTE — Telephone Encounter (Signed)
This has been done.

## 2013-05-31 NOTE — Telephone Encounter (Signed)
Pt called stated that he was here last week for neck problem and after 2 round of steroid, pt still having problem with his neck. Pt does not think its muscle but it might disc in neck area that causing the pain. Pt request referral for Dr. Jeral Fruit [phone]), he is a spine specialist. Please advise.

## 2013-06-24 ENCOUNTER — Other Ambulatory Visit: Payer: Self-pay | Admitting: Neurosurgery

## 2013-06-24 DIAGNOSIS — M542 Cervicalgia: Secondary | ICD-10-CM

## 2013-06-26 ENCOUNTER — Ambulatory Visit
Admission: RE | Admit: 2013-06-26 | Discharge: 2013-06-26 | Disposition: A | Discharge status: Home / Self Care | Payer: No Typology Code available for payment source | Source: Ambulatory Visit | Attending: Neurosurgery | Admitting: Neurosurgery

## 2013-06-26 DIAGNOSIS — M542 Cervicalgia: Secondary | ICD-10-CM

## 2013-06-27 ENCOUNTER — Emergency Department: Payer: Self-pay | Admitting: Internal Medicine

## 2013-09-06 ENCOUNTER — Other Ambulatory Visit: Payer: Self-pay | Admitting: Neurosurgery

## 2013-09-06 ENCOUNTER — Encounter (HOSPITAL_COMMUNITY): Payer: Self-pay | Admitting: Pharmacy Technician

## 2013-09-08 ENCOUNTER — Encounter (HOSPITAL_COMMUNITY): Payer: Self-pay

## 2013-09-08 ENCOUNTER — Encounter (HOSPITAL_COMMUNITY)
Admission: RE | Admit: 2013-09-08 | Discharge: 2013-09-08 | Disposition: A | Discharge status: Home / Self Care | Payer: No Typology Code available for payment source | Source: Ambulatory Visit | Attending: Neurosurgery | Admitting: Neurosurgery

## 2013-09-08 ENCOUNTER — Encounter (HOSPITAL_COMMUNITY)
Admission: RE | Admit: 2013-09-08 | Discharge: 2013-09-08 | Disposition: A | Discharge status: Home / Self Care | Payer: No Typology Code available for payment source | Source: Ambulatory Visit | Attending: Anesthesiology | Admitting: Anesthesiology

## 2013-09-08 HISTORY — DX: Headache: R51

## 2013-09-08 LAB — BASIC METABOLIC PANEL
BUN: 16 mg/dL (ref 6–23)
CO2: 27 mEq/L (ref 19–32)
Calcium: 9.6 mg/dL (ref 8.4–10.5)
Chloride: 102 mEq/L (ref 96–112)
Creatinine, Ser: 0.88 mg/dL (ref 0.50–1.35)
GFR calc Af Amer: >90 mL/min (ref >90)
GFR calc non Af Amer: >90 mL/min (ref >90)
Glucose, Bld: 96 mg/dL (ref 70–99)
Potassium: 4.5 mEq/L (ref 3.5–5.1)
Sodium: 137 mEq/L (ref 135–145)

## 2013-09-08 LAB — CBC
HCT: 42 % (ref 39.0–52.0)
Hemoglobin: 14.7 g/dL (ref 13.0–17.0)
MCH: 30.4 pg (ref 26.0–34.0)
MCHC: 35 g/dL (ref 30.0–36.0)
MCV: 87 fL (ref 78.0–100.0)
Platelets: 255 10*3/uL (ref 150–400)
RBC: 4.83 MIL/uL (ref 4.22–5.81)
RDW: 13.8 % (ref 11.5–15.5)
WBC: 10 10*3/uL (ref 4.0–10.5)

## 2013-09-08 LAB — SURGICAL PCR SCREEN
MRSA, PCR: NEGATIVE
Staphylococcus aureus: NEGATIVE

## 2013-09-08 NOTE — Pre-Procedure Instructions (Signed)
Donald Roberts  09/08/2013   Your procedure is scheduled on:  Friday, September 10, 2013  Report to [facility] Short Stay (use Main Entrance "A'') at  11:00 AM.  Call this number if you have problems the morning of surgery: [phone]   Remember:   Do not eat food or drink liquids after midnight.   Take these medicines the morning of surgery with A SIP OF WATER: If Needed: HYDROcodone-acetaminophen (NORCO) 10-325 MG per tablet for pain, diazepam (VALIUM) 5 MG tablet  For anxiety, nitroGLYCERIN (NITROSTAT) 0.4 MG SL tablet for chest pain, famotidine-calcium carbonate-magnesium hydroxide (PEPCID COMPLETE) 10-800-165 MG CHEW chewable tablet  Stop taking Aspirin and herbal medications. Do not take any NSAIDs ie: Ibuprofen, Advil, Naproxen or any medication containing Aspirin.  Do not wear jewelry, make-up or nail polish.  Do not wear lotions, powders, or perfumes. You may wear deodorant.  Do not shave 48 hours prior to surgery. Men may shave face and neck.  Do not bring valuables to the hospital.  [facility] is not responsible for any belongings or valuables.               Contacts, dentures or bridgework may not be worn into surgery.  Leave suitcase in the car. After surgery it may be brought to your room.  For patients admitted to the hospital, discharge time is determined by your treatment team.               Patients discharged the day of surgery will not be allowed to drive home.  Name and phone number of your driver:   Special Instructions: Shower using CHG 2 nights before surgery and the night before surgery.  If you shower the day of surgery use CHG.  Use special wash - you have one bottle of CHG for all showers.  You should use approximately 1/3 of the bottle for each shower.   Please read over the following fact sheets that you were given: Pain Booklet, Coughing and Deep Breathing and Surgical Site Infection Prevention

## 2013-09-08 NOTE — Progress Notes (Signed)
Spoke with Shanda Bumps, Dr. Cassandria Santee surgical scheduler to clarify pt arrival time on DOS; Shanda Bumps advised that the pt arrive at 11:00 AM "just in case the doctor finishes up early."

## 2013-09-08 NOTE — Progress Notes (Signed)
Pt denies SOB and  chest pain but states that he is under the care Dr. Donnie Aho ( cardiologist). Pt denies having an echo but states that he had a cardiac cath; results were requested via telephone along with results from any other cardiac studies and latest office notes.

## 2013-09-09 MED ORDER — CEFAZOLIN SODIUM-DEXTROSE 2-3 GM-% IV SOLR
2.0000 g | INTRAVENOUS | Status: AC
  Administered 2013-09-10: 2 g via INTRAVENOUS
  Filled 2013-09-09: qty 50

## 2013-09-10 ENCOUNTER — Ambulatory Visit (HOSPITAL_COMMUNITY): Payer: No Typology Code available for payment source | Admitting: Certified Registered Nurse Anesthetist

## 2013-09-10 ENCOUNTER — Encounter (HOSPITAL_COMMUNITY): Payer: Self-pay | Admitting: Anesthesiology

## 2013-09-10 ENCOUNTER — Ambulatory Visit (HOSPITAL_COMMUNITY): Payer: No Typology Code available for payment source

## 2013-09-10 ENCOUNTER — Encounter (HOSPITAL_COMMUNITY)
Admission: RE | Disposition: A | Discharge status: Home / Self Care | Payer: Self-pay | Source: Ambulatory Visit | Attending: Neurosurgery

## 2013-09-10 ENCOUNTER — Encounter (HOSPITAL_COMMUNITY): Payer: No Typology Code available for payment source | Admitting: Certified Registered Nurse Anesthetist

## 2013-09-10 ENCOUNTER — Ambulatory Visit (HOSPITAL_COMMUNITY)
Admission: RE | Admit: 2013-09-10 | Discharge: 2013-09-11 | DRG: 473 | Disposition: A | Discharge status: Home / Self Care | Payer: No Typology Code available for payment source | Source: Ambulatory Visit | Attending: Neurosurgery | Admitting: Neurosurgery

## 2013-09-10 DIAGNOSIS — F17200 Nicotine dependence, unspecified, uncomplicated: Secondary | ICD-10-CM | POA: Diagnosis present

## 2013-09-10 DIAGNOSIS — IMO0002 Reserved for concepts with insufficient information to code with codable children: Secondary | ICD-10-CM | POA: Diagnosis present

## 2013-09-10 DIAGNOSIS — M47812 Spondylosis without myelopathy or radiculopathy, cervical region: Principal | ICD-10-CM | POA: Diagnosis present

## 2013-09-10 DIAGNOSIS — Z79899 Other long term (current) drug therapy: Secondary | ICD-10-CM

## 2013-09-10 DIAGNOSIS — G96198 Other disorders of meninges, not elsewhere classified: Secondary | ICD-10-CM | POA: Diagnosis present

## 2013-09-10 DIAGNOSIS — M5030 Other cervical disc degeneration, unspecified cervical region: Secondary | ICD-10-CM | POA: Diagnosis present

## 2013-09-10 DIAGNOSIS — Z8719 Personal history of other diseases of the digestive system: Secondary | ICD-10-CM

## 2013-09-10 DIAGNOSIS — I2510 Atherosclerotic heart disease of native coronary artery without angina pectoris: Secondary | ICD-10-CM | POA: Diagnosis present

## 2013-09-10 DIAGNOSIS — E785 Hyperlipidemia, unspecified: Secondary | ICD-10-CM | POA: Diagnosis present

## 2013-09-10 HISTORY — PX: ANTERIOR CERVICAL DECOMP/DISCECTOMY FUSION: SHX1161

## 2013-09-10 SURGERY — ANTERIOR CERVICAL DECOMPRESSION/DISCECTOMY FUSION 2 LEVELS
Anesthesia: General | Wound class: Clean

## 2013-09-10 MED ORDER — ARTIFICIAL TEARS OP OINT
TOPICAL_OINTMENT | OPHTHALMIC | Status: DC | PRN
  Administered 2013-09-10: 1 via OPHTHALMIC

## 2013-09-10 MED ORDER — EPHEDRINE SULFATE 50 MG/ML IJ SOLN
INTRAMUSCULAR | Status: DC | PRN
  Administered 2013-09-10: 5 mg via INTRAVENOUS

## 2013-09-10 MED ORDER — NEOSTIGMINE METHYLSULFATE 1 MG/ML IJ SOLN
INTRAMUSCULAR | Status: DC | PRN
  Administered 2013-09-10: 3 mg via INTRAVENOUS

## 2013-09-10 MED ORDER — THROMBIN 5000 UNITS EX SOLR
CUTANEOUS | Status: DC | PRN
  Administered 2013-09-10 (×3): 5000 [IU] via TOPICAL

## 2013-09-10 MED ORDER — SODIUM CHLORIDE 0.9 % IJ SOLN: 3.0000 mL | INTRAMUSCULAR | Status: DC | PRN

## 2013-09-10 MED ORDER — MORPHINE SULFATE 2 MG/ML IJ SOLN
1.0000 mg | INTRAMUSCULAR | Status: DC | PRN
  Administered 2013-09-10: 4 mg via INTRAVENOUS
  Administered 2013-09-10: 2 mg via INTRAVENOUS
  Administered 2013-09-11: 4 mg via INTRAVENOUS
  Filled 2013-09-10: qty 1
  Filled 2013-09-10 (×2): qty 2

## 2013-09-10 MED ORDER — OXYCODONE HCL 5 MG/5ML PO SOLN: 5.0000 mg | Freq: Once | ORAL | Status: AC | PRN

## 2013-09-10 MED ORDER — HYDROMORPHONE HCL PF 1 MG/ML IJ SOLN
0.2500 mg | INTRAMUSCULAR | Status: DC | PRN
  Administered 2013-09-10 (×2): 0.5 mg via INTRAVENOUS
  Administered 2013-09-10: 1 mg via INTRAVENOUS

## 2013-09-10 MED ORDER — MIDAZOLAM HCL 5 MG/5ML IJ SOLN
INTRAMUSCULAR | Status: DC | PRN
  Administered 2013-09-10: 2 mg via INTRAVENOUS

## 2013-09-10 MED ORDER — CEFAZOLIN SODIUM 1-5 GM-% IV SOLN
1.0000 g | Freq: Three times a day (TID) | INTRAVENOUS | Status: AC
  Administered 2013-09-10 – 2013-09-11 (×2): 1 g via INTRAVENOUS
  Filled 2013-09-10 (×3): qty 50

## 2013-09-10 MED ORDER — OXYCODONE-ACETAMINOPHEN 5-325 MG PO TABS
1.0000 | ORAL_TABLET | ORAL | Status: DC | PRN
  Administered 2013-09-10 – 2013-09-11 (×3): 2 via ORAL
  Filled 2013-09-10 (×3): qty 2

## 2013-09-10 MED ORDER — SODIUM CHLORIDE 0.9 % IV SOLN
INTRAVENOUS | Status: DC
  Administered 2013-09-10: 1000 mL via INTRAVENOUS

## 2013-09-10 MED ORDER — GLYCOPYRROLATE 0.2 MG/ML IJ SOLN
INTRAMUSCULAR | Status: DC | PRN
  Administered 2013-09-10: 0.4 mg via INTRAVENOUS

## 2013-09-10 MED ORDER — SIMVASTATIN 40 MG PO TABS
40.0000 mg | ORAL_TABLET | Freq: Every day | ORAL | Status: DC
  Filled 2013-09-10 (×2): qty 1

## 2013-09-10 MED ORDER — ONDANSETRON HCL 4 MG/2ML IJ SOLN: 4.0000 mg | INTRAMUSCULAR | Status: DC | PRN

## 2013-09-10 MED ORDER — LIDOCAINE HCL (CARDIAC) 20 MG/ML IV SOLN
INTRAVENOUS | Status: DC | PRN
  Administered 2013-09-10: 100 mg via INTRAVENOUS

## 2013-09-10 MED ORDER — OXYCODONE HCL 5 MG PO TABS
5.0000 mg | ORAL_TABLET | Freq: Once | ORAL | Status: AC | PRN
  Administered 2013-09-10: 5 mg via ORAL

## 2013-09-10 MED ORDER — MENTHOL 3 MG MT LOZG: 1.0000 | LOZENGE | OROMUCOSAL | Status: DC | PRN

## 2013-09-10 MED ORDER — OXYCODONE-ACETAMINOPHEN 5-325 MG PO TABS
ORAL_TABLET | ORAL | Status: AC
  Filled 2013-09-10: qty 2

## 2013-09-10 MED ORDER — DEXAMETHASONE SODIUM PHOSPHATE 4 MG/ML IJ SOLN
INTRAMUSCULAR | Status: DC | PRN
  Administered 2013-09-10: 4 mg via INTRAVENOUS

## 2013-09-10 MED ORDER — ZOLPIDEM TARTRATE 5 MG PO TABS: 5.0000 mg | ORAL_TABLET | Freq: Every evening | ORAL | Status: DC | PRN

## 2013-09-10 MED ORDER — NITROGLYCERIN 0.4 MG SL SUBL: 0.4000 mg | SUBLINGUAL_TABLET | SUBLINGUAL | Status: DC | PRN

## 2013-09-10 MED ORDER — LACTATED RINGERS IV SOLN
INTRAVENOUS | Status: DC
  Administered 2013-09-10: 50 mL/h via INTRAVENOUS
  Administered 2013-09-10: 15:00:00 via INTRAVENOUS

## 2013-09-10 MED ORDER — ROCURONIUM BROMIDE 100 MG/10ML IV SOLN
INTRAVENOUS | Status: DC | PRN
  Administered 2013-09-10: 10 mg via INTRAVENOUS
  Administered 2013-09-10: 20 mg via INTRAVENOUS
  Administered 2013-09-10: 50 mg via INTRAVENOUS
  Administered 2013-09-10: 10 mg via INTRAVENOUS

## 2013-09-10 MED ORDER — OXYCODONE HCL 5 MG PO TABS
ORAL_TABLET | ORAL | Status: AC
  Filled 2013-09-10: qty 1

## 2013-09-10 MED ORDER — 0.9 % SODIUM CHLORIDE (POUR BTL) OPTIME
TOPICAL | Status: DC | PRN
  Administered 2013-09-10: 1000 mL

## 2013-09-10 MED ORDER — DIAZEPAM 5 MG PO TABS
5.0000 mg | ORAL_TABLET | Freq: Four times a day (QID) | ORAL | Status: DC | PRN
  Administered 2013-09-10 – 2013-09-11 (×2): 5 mg via ORAL
  Filled 2013-09-10: qty 1

## 2013-09-10 MED ORDER — SODIUM CHLORIDE 0.9 % IV SOLN: 250.0000 mL | INTRAVENOUS | Status: DC

## 2013-09-10 MED ORDER — HYDROMORPHONE HCL PF 1 MG/ML IJ SOLN
INTRAMUSCULAR | Status: AC
  Filled 2013-09-10: qty 1

## 2013-09-10 MED ORDER — RAMIPRIL 5 MG PO CAPS
5.0000 mg | ORAL_CAPSULE | Freq: Every day | ORAL | Status: DC
  Administered 2013-09-10 – 2013-09-11 (×2): 5 mg via ORAL
  Filled 2013-09-10 (×2): qty 1

## 2013-09-10 MED ORDER — PROPOFOL 10 MG/ML IV BOLUS
INTRAVENOUS | Status: DC | PRN
  Administered 2013-09-10: 200 mg via INTRAVENOUS

## 2013-09-10 MED ORDER — HEMOSTATIC AGENTS (NO CHARGE) OPTIME
TOPICAL | Status: DC | PRN
  Administered 2013-09-10: 1 via TOPICAL

## 2013-09-10 MED ORDER — ACETAMINOPHEN 650 MG RE SUPP: 650.0000 mg | RECTAL | Status: DC | PRN

## 2013-09-10 MED ORDER — DIAZEPAM 5 MG PO TABS
ORAL_TABLET | ORAL | Status: AC
  Filled 2013-09-10: qty 1

## 2013-09-10 MED ORDER — DEXAMETHASONE SODIUM PHOSPHATE 4 MG/ML IJ SOLN
4.0000 mg | Freq: Four times a day (QID) | INTRAMUSCULAR | Status: DC
  Filled 2013-09-10 (×4): qty 1

## 2013-09-10 MED ORDER — DEXAMETHASONE 4 MG PO TABS
4.0000 mg | ORAL_TABLET | Freq: Four times a day (QID) | ORAL | Status: DC
  Administered 2013-09-10 – 2013-09-11 (×4): 4 mg via ORAL
  Filled 2013-09-10 (×7): qty 1

## 2013-09-10 MED ORDER — SODIUM CHLORIDE 0.9 % IJ SOLN
3.0000 mL | Freq: Two times a day (BID) | INTRAMUSCULAR | Status: DC
  Administered 2013-09-10: 3 mL via INTRAVENOUS

## 2013-09-10 MED ORDER — FENTANYL CITRATE 0.05 MG/ML IJ SOLN
INTRAMUSCULAR | Status: DC | PRN
  Administered 2013-09-10: 100 ug via INTRAVENOUS
  Administered 2013-09-10 (×2): 50 ug via INTRAVENOUS

## 2013-09-10 MED ORDER — ACETAMINOPHEN 325 MG PO TABS: 650.0000 mg | ORAL_TABLET | ORAL | Status: DC | PRN

## 2013-09-10 MED ORDER — PHENOL 1.4 % MT LIQD
1.0000 | OROMUCOSAL | Status: DC | PRN
  Administered 2013-09-10: 1 via OROMUCOSAL
  Filled 2013-09-10: qty 177

## 2013-09-10 MED ORDER — METOCLOPRAMIDE HCL 5 MG/ML IJ SOLN: 10.0000 mg | Freq: Once | INTRAMUSCULAR | Status: DC | PRN

## 2013-09-10 MED ORDER — ASPIRIN 81 MG PO CHEW
81.0000 mg | CHEWABLE_TABLET | Freq: Every day | ORAL | Status: DC
  Administered 2013-09-11: 81 mg via ORAL
  Filled 2013-09-10: qty 1

## 2013-09-10 SURGICAL SUPPLY — 58 items
BANDAGE GAUZE ELAST BULKY 4 IN (GAUZE/BANDAGES/DRESSINGS) ×4 IMPLANT
BENZOIN TINCTURE PRP APPL 2/3 (GAUZE/BANDAGES/DRESSINGS) ×2 IMPLANT
BIT DRILL SM SPINE QC 14 (BIT) ×2 IMPLANT
BLADE ULTRA TIP 2M (BLADE) ×2 IMPLANT
BUR BARREL STRAIGHT FLUTE 4.0 (BURR) ×2 IMPLANT
BUR MATCHSTICK NEURO 3.0 LAGG (BURR) ×2 IMPLANT
CANISTER SUCTION 2500CC (MISCELLANEOUS) ×2 IMPLANT
CLOTH BEACON ORANGE TIMEOUT ST (SAFETY) ×2 IMPLANT
CONT SPEC 4OZ CLIKSEAL STRL BL (MISCELLANEOUS) ×2 IMPLANT
COVER MAYO STAND STRL (DRAPES) ×2 IMPLANT
DRAIN JACKSON PRATT 10MM FLAT (MISCELLANEOUS) ×2 IMPLANT
DRAPE C-ARM 42X72 X-RAY (DRAPES) ×2 IMPLANT
DRAPE LAPAROTOMY 100X72 PEDS (DRAPES) ×2 IMPLANT
DRAPE MICROSCOPE LEICA (MISCELLANEOUS) ×2 IMPLANT
DRAPE POUCH INSTRU U-SHP 10X18 (DRAPES) ×2 IMPLANT
DURAPREP 6ML APPLICATOR 50/CS (WOUND CARE) ×2 IMPLANT
ELECT REM PT RETURN 9FT ADLT (ELECTROSURGICAL) ×2
ELECTRODE REM PT RTRN 9FT ADLT (ELECTROSURGICAL) ×1 IMPLANT
EVACUATOR SILICONE 100CC (DRAIN) ×2 IMPLANT
GAUZE SPONGE 4X4 16PLY XRAY LF (GAUZE/BANDAGES/DRESSINGS) IMPLANT
GLOVE BIOGEL M 8.0 STRL (GLOVE) ×2 IMPLANT
GLOVE ECLIPSE 6.5 STRL STRAW (GLOVE) ×2 IMPLANT
GLOVE EXAM NITRILE LRG STRL (GLOVE) ×2 IMPLANT
GLOVE EXAM NITRILE MD LF STRL (GLOVE) IMPLANT
GLOVE EXAM NITRILE XL STR (GLOVE) IMPLANT
GLOVE EXAM NITRILE XS STR PU (GLOVE) IMPLANT
GLOVE INDICATOR 7.0 STRL GRN (GLOVE) ×2 IMPLANT
GLOVE SS BIOGEL STRL SZ 6.5 (GLOVE) ×3 IMPLANT
GLOVE SUPERSENSE BIOGEL SZ 6.5 (GLOVE) ×3
GOWN BRE IMP SLV AUR LG STRL (GOWN DISPOSABLE) ×6 IMPLANT
GOWN BRE IMP SLV AUR XL STRL (GOWN DISPOSABLE) IMPLANT
GOWN STRL REIN 2XL LVL4 (GOWN DISPOSABLE) IMPLANT
HEAD HALTER (SOFTGOODS) IMPLANT
HEMOSTAT POWDER SURGIFOAM 1G (HEMOSTASIS) ×2 IMPLANT
KIT BASIN OR (CUSTOM PROCEDURE TRAY) ×2 IMPLANT
KIT ROOM TURNOVER OR (KITS) ×2 IMPLANT
KNIFE ARACHNOID DISP AM-21-S (BLADE) ×2 IMPLANT
NEEDLE SPNL 22GX3.5 QUINCKE BK (NEEDLE) ×2 IMPLANT
NS IRRIG 1000ML POUR BTL (IV SOLUTION) ×2 IMPLANT
PACK LAMINECTOMY NEURO (CUSTOM PROCEDURE TRAY) ×2 IMPLANT
PATTIES SURGICAL .5 X1 (DISPOSABLE) ×2 IMPLANT
PLATE ANT CERV XTEND 2 LV 30 (Plate) ×2 IMPLANT
PUTTY DBX 1CC (Putty) ×2 IMPLANT
PUTTY DBX 1CC DEPUY (Putty) ×1 IMPLANT
RUBBERBAND STERILE (MISCELLANEOUS) ×4 IMPLANT
SCREW XTD VAR 4.2 SELF TAP (Screw) ×12 IMPLANT
SPACER ACDF SM LORDOTIC 7 (Spacer) ×4 IMPLANT
SPACER COLONIAL SZ 9-11X12-7 (Spacer) ×2 IMPLANT
SPONGE GAUZE 4X4 12PLY (GAUZE/BANDAGES/DRESSINGS) ×2 IMPLANT
SPONGE INTESTINAL PEANUT (DISPOSABLE) ×2 IMPLANT
SPONGE SURGIFOAM ABS GEL SZ50 (HEMOSTASIS) ×2 IMPLANT
STRIP CLOSURE SKIN 1/2X4 (GAUZE/BANDAGES/DRESSINGS) ×2 IMPLANT
SUT VIC AB 3-0 SH 8-18 (SUTURE) ×4 IMPLANT
SYR 20ML ECCENTRIC (SYRINGE) ×2 IMPLANT
TAPE CLOTH SURG 4X10 WHT LF (GAUZE/BANDAGES/DRESSINGS) ×2 IMPLANT
TOWEL OR 17X24 6PK STRL BLUE (TOWEL DISPOSABLE) ×2 IMPLANT
TOWEL OR 17X26 10 PK STRL BLUE (TOWEL DISPOSABLE) ×2 IMPLANT
WATER STERILE IRR 1000ML POUR (IV SOLUTION) ×2 IMPLANT

## 2013-09-10 NOTE — Anesthesia Procedure Notes (Signed)
Procedure Name: Intubation Date/Time: 09/10/2013 2:19 PM Performed by: Karn Cassis Pre-anesthesia Checklist: Patient identified, Timeout performed, Emergency Drugs available, Suction available and Patient being monitored Patient Re-evaluated:Patient Re-evaluated prior to inductionOxygen Delivery Method: Circle system utilized Preoxygenation: Pre-oxygenation with 100% oxygen Intubation Type: IV induction Ventilation: Mask ventilation without difficulty and Oral airway inserted - appropriate to patient size Laryngoscope Size: Hyacinth Meeker and 1 Grade View: Grade III Tube type: Oral Tube size: 7.5 mm Number of attempts: 1 Airway Equipment and Method: Stylet Placement Confirmation: positive ETCO2,  ETT inserted through vocal cords under direct vision and breath sounds checked- equal and bilateral Secured at: 23 cm Tube secured with: Tape Dental Injury: Teeth and Oropharynx as per pre-operative assessment

## 2013-09-10 NOTE — Anesthesia Postprocedure Evaluation (Signed)
  Anesthesia Post-op Note  Patient: Donald Roberts  Procedure(s) Performed: Procedure(s): ANTERIOR CERVICAL DECOMPRESSION/DISCECTOMY FUSION CERVICAL FOUR-FIVE,CERVICAL FIVE-SIX WITH PLATING AND BONE GRAFT (N/A)  Patient Location: PACU  Anesthesia Type:General  Level of Consciousness: awake  Airway and Oxygen Therapy: Patient Spontanous Breathing and Patient connected to nasal cannula oxygen  Post-op Pain: mild  Post-op Assessment: Post-op Vital signs reviewed, Patient's Cardiovascular Status Stable, Respiratory Function Stable, Patent Airway and No signs of Nausea or vomiting  Post-op Vital Signs: Reviewed and stable  Complications: No apparent anesthesia complications

## 2013-09-10 NOTE — Progress Notes (Signed)
Op note [phone]

## 2013-09-10 NOTE — Progress Notes (Signed)
Orthopedic Tech Progress Note Patient Details:  Donald Roberts 12-10-1950 [id number]  Ortho Devices Type of Ortho Device: Soft collar Ortho Device/Splint Interventions: Application   Shawnie Pons 09/10/2013, 6:52 PM

## 2013-09-10 NOTE — Anesthesia Preprocedure Evaluation (Addendum)
Anesthesia Evaluation  Patient identified by MRN, date of birth, ID band Patient awake    Reviewed: Allergy & Precautions, H&P , NPO status , Patient's Chart, lab work & pertinent test results, reviewed documented beta blocker date and time   Airway Mallampati: II TM Distance: >3 FB Neck ROM: full    Dental  (+) Teeth Intact and Dental Advisory Given   Pulmonary neg pulmonary ROS, Current Smoker,  breath sounds clear to auscultation        Cardiovascular + CAD Rhythm:regular     Neuro/Psych  Headaches, negative psych ROS   GI/Hepatic negative GI ROS, Neg liver ROS,   Endo/Other  negative endocrine ROS  Renal/GU negative Renal ROS  negative genitourinary   Musculoskeletal   Abdominal   Peds  Hematology negative hematology ROS (+)   Anesthesia Other Findings See surgeon's H&P   Reproductive/Obstetrics negative OB ROS                          Anesthesia Physical Anesthesia Plan  ASA: III  Anesthesia Plan: General   Post-op Pain Management:    Induction: Intravenous  Airway Management Planned: Oral ETT  Additional Equipment:   Intra-op Plan:   Post-operative Plan: Extubation in OR  Informed Consent: I have reviewed the patients History and Physical, chart, labs and discussed the procedure including the risks, benefits and alternatives for the proposed anesthesia with the patient or authorized representative who has indicated his/her understanding and acceptance.   Dental Advisory Given  Plan Discussed with: CRNA and Surgeon  Anesthesia Plan Comments:         Anesthesia Quick Evaluation

## 2013-09-10 NOTE — Preoperative (Signed)
Beta Blockers   Reason not to administer Beta Blockers:Not Applicable 

## 2013-09-10 NOTE — H&P (Signed)
Donald Roberts is an 62 y.o. male.   Chief Complaint: NECK PAIN HPI: patient seen with pain mostly going to the left shoulder for several months not better with conservative treatment since May of this year. Had a cervical myelogram few days ago  Past Medical History  Diagnosis Date  . Abdominal pain, generalized 07/18/2008  . ALLERGIC RHINITIS 07/18/2008  . COLONIC POLYPS, HX OF 07/18/2008  . CORONARY ARTERY DISEASE 07/18/2008  . DIVERTICULOSIS, COLON 07/18/2008  . Gross hematuria 07/18/2008  . HYPERLIPIDEMIA 07/18/2008  . TOBACCO ABUSE 04/06/2009  . Back pain, chronic 10/01/2011  . Degenerative arthritis of right knee 10/01/2011  . Chronic low back pain 12/25/2012  . Headache(784.0)     Hx: of Migraines as a child    Past Surgical History  Procedure Laterality Date  . S/p cervical disease x 2    . Lumbar surgury      dr Cordie Buening/ns; 3 level fusion per pt  . Cardiac catheterization    . Colonoscopy w/ biopsies and polypectomy      Hx: of    Family History  Problem Relation Age of Onset  . Heart disease Other    Social History:  reports that he has been smoking Cigarettes.  He has been smoking about 0.00 packs per day. He has never used smokeless tobacco. He reports that he drinks alcohol. He reports that he does not use illicit drugs.  Allergies:  Allergies  Allergen Reactions  . Sildenafil Citrate Other (See Comments)    headache    Medications Prior to Admission  Medication Sig Dispense Refill  . aspirin 81 MG tablet Take 81 mg by mouth daily.      . diazepam (VALIUM) 5 MG tablet Take 5 mg by mouth as needed for anxiety.      . famotidine-calcium carbonate-magnesium hydroxide (PEPCID COMPLETE) 10-800-165 MG CHEW chewable tablet Chew 1 tablet by mouth daily as needed.      Marland Kitchen oxyCODONE-acetaminophen (PERCOCET) 10-325 MG per tablet Take 1 tablet by mouth every 4 (four) hours as needed for pain.      . pravastatin (PRAVACHOL) 40 MG tablet Take 40 mg by mouth daily.      .  ramipril (ALTACE) 5 MG capsule Take 5 mg by mouth daily.      . nitroGLYCERIN (NITROSTAT) 0.4 MG SL tablet Place 0.4 mg under the tongue every 5 (five) minutes as needed for chest pain.        No results found for this or any previous visit (from the past 48 hour(s)). No results found.  Review of Systems  Constitutional: Negative.   Eyes: Negative.   Respiratory: Negative.   Cardiovascular: Negative.   Gastrointestinal: Negative.   Musculoskeletal: Positive for neck pain.  Skin: Negative.   Neurological: Positive for sensory change and focal weakness.  Endo/Heme/Allergies: Negative.   Psychiatric/Behavioral: Negative.     Blood pressure 160/88, pulse 61, temperature 97.3 F (36.3 C), temperature source Oral, resp. rate 16, SpO2 97.00%. Physical Exam hent, nl. Neck, pain with movement ,scar anterior. Cv, nl. Lungs, clear. Abdomen, soft. Extremities, nl. NEURO some weakness of biceps. 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   Assessment/Plan Decompression and fusion at cervical45 and 56. He is aware of risks and benefits   Manas Hickling M 09/10/2013, 1:47 PM

## 2013-09-10 NOTE — Transfer of Care (Signed)
Immediate Anesthesia Transfer of Care Note  Patient: Donald Roberts  Procedure(s) Performed: Procedure(s): ANTERIOR CERVICAL DECOMPRESSION/DISCECTOMY FUSION CERVICAL FOUR-FIVE,CERVICAL FIVE-SIX WITH PLATING AND BONE GRAFT (N/A)  Patient Location: PACU  Anesthesia Type:General  Level of Consciousness: awake and alert   Airway & Oxygen Therapy: Patient Spontanous Breathing and Patient connected to nasal cannula oxygen  Post-op Assessment: Report given to PACU RN and Post -op Vital signs reviewed and stable  Post vital signs: Reviewed and stable  Complications: No apparent anesthesia complications

## 2013-09-11 MED ORDER — HYDROMORPHONE HCL PF 1 MG/ML IJ SOLN
0.5000 mg | INTRAMUSCULAR | Status: DC | PRN
  Administered 2013-09-11: 1 mg via INTRAVENOUS
  Filled 2013-09-11: qty 1

## 2013-09-11 MED ORDER — METHYLPREDNISOLONE (PAK) 4 MG PO TABS: ORAL_TABLET | ORAL | Status: DC

## 2013-09-11 MED ORDER — OXYCODONE-ACETAMINOPHEN 5-325 MG PO TABS
2.0000 | ORAL_TABLET | ORAL | Status: DC | PRN
  Administered 2013-09-11: 2 via ORAL

## 2013-09-11 NOTE — Evaluation (Signed)
Occupational Therapy Evaluation Patient Details Name: Donald Roberts MRN: [id number] DOB: [birth year] Today's Date: 09/11/2013 Time: 4098-1191 OT Time Calculation (min): 22 min  OT Assessment / Plan / Recommendation History of present illness Mrs. Donald Roberts is a 62 y.o. male who was admitted after elective C4-5 and C5-6 ACDF   Clinical Impression   Pt admitted with above. Pt moving well and feel pt is safe to d/c home from OT standpoint with family available to assist 24/7. OT provided education.     OT Assessment  Patient does not need any further OT services    Follow Up Recommendations  No OT follow up;Supervision - Intermittent    Barriers to Discharge      Equipment Recommendations  None recommended by OT    Recommendations for Other Services    Frequency       Precautions / Restrictions Precautions Precautions: Cervical Precaution Comments: cervical handout given Required Braces or Orthoses: Cervical Brace Cervical Brace: Soft collar Restrictions Weight Bearing Restrictions: No   Pertinent Vitals/Pain No pain reported.     ADL  Upper Body Bathing: Set up;Supervision/safety Where Assessed - Upper Body Bathing: Unsupported sitting Lower Body Bathing: Moderate assistance Where Assessed - Lower Body Bathing: Unsupported sit to stand Upper Body Dressing: Set up;Supervision/safety Where Assessed - Upper Body Dressing: Unsupported sitting Lower Body Dressing: Moderate assistance Where Assessed - Lower Body Dressing: Unsupported sit to stand Toilet Transfer: Supervision/safety Toilet Transfer Method: Sit to Barista: Comfort height toilet;Grab bars Tub/Shower Transfer: Therapist, sports Method: Science writer: Walk in Scientist, research (physical sciences) Used: Other (comment) (cervical collar) Transfers/Ambulation Related to ADLs: Supervision-cues for precautions.  ADL Comments: Educated on use of two cups for  teeth care. Gave several cues for cervical precautions during session. Educated to have wife assist with LB ADLs since pt was moving neck some when crossing legs over knees. Educated that AE is available to assist with this but pt not interested and reports family will help him.     OT Diagnosis:    OT Problem List:   OT Treatment Interventions:     OT Goals(Current goals can be found in the care plan section)    Visit Information  Last OT Received On: 09/11/13 Assistance Needed: +1 History of Present Illness: Mrs. Donald Roberts is a 62 y.o. male who was admitted after elective C4-5 and C5-6 ACDF       Prior Functioning     Home Living Family/patient expects to be discharged to:: Private residence Living Arrangements: Spouse/significant other Available Help at Discharge: Family;Available 24 hours/day Type of Home: House Home Access: Stairs to enter [employer] of Steps: 3 Entrance Stairs-Rails: Right Home Layout: Two level;Able to live on main level with bedroom/bathroom Home Equipment: Grab bars - tub/shower;Hand held shower head;Grab bars - toilet;Shower seat Prior Function Level of Independence: Independent Communication Communication: No difficulties Dominant Hand: Right         Vision/Perception     Cognition  Cognition Arousal/Alertness: Awake/alert Behavior During Therapy: WFL for tasks assessed/performed Overall Cognitive Status: Within Functional Limits for tasks assessed    Extremity/Trunk Assessment Upper Extremity Assessment Upper Extremity Assessment: Overall WFL for tasks assessed Lower Extremity Assessment Lower Extremity Assessment: Defer to PT evaluation     Mobility Bed Mobility Bed Mobility: Rolling Right;Right Sidelying to Sit Rolling Right: 5: Supervision Right Sidelying to Sit: 5: Supervision Details for Bed Mobility Assistance: gave cues for technique as pt was trying to do more  of supine to sit  technique. Transfers Transfers: Sit to Stand;Stand to Sit Sit to Stand: 5: Supervision; 6: Modified Independent;From bed;From toilet Stand to Sit: 5: Supervision; 6: Modified Independent; To bed;To toilet Details for Transfer Assistance:  Supervision for sit <> stand from toilet. Cues for precautions      Exercise     Balance     End of Session OT - End of Session Equipment Utilized During Treatment: Other (comment) (cervical collar) Activity Tolerance: Patient tolerated treatment well Patient left: in bed;with family/visitor present  GO     Earlie Raveling OTR/L [phone] 09/11/2013, 1:16 PM

## 2013-09-11 NOTE — Progress Notes (Signed)
Ambulated patient in hallway with standby assist and walker. Pt tolerated well.

## 2013-09-11 NOTE — Evaluation (Addendum)
Physical Therapy Evaluation Patient Details Name: Donald Roberts MRN: [id number] DOB: [birth year] Today's Date: 09/11/2013 Time: 8657-8469 PT Time Calculation (min): 17 min  PT Assessment / Plan / Recommendation History of Present Illness  Donald Roberts is a 62 y.o. male who was admitted after elective C4-5 and C5-6 ACDF  Clinical Impression  Pt admitted with the above. Pt moving well and has no further PT needs. PT will sign off.    PT Assessment  Patent does not need any further PT services    Follow Up Recommendations  No PT follow up    Precautions / Restrictions Precautions Precautions: Cervical Precaution Comments: cervical handout given Required Braces or Orthoses: Cervical Brace Cervical Brace: Soft collar Restrictions Weight Bearing Restrictions: No   Pertinent Vitals/Pain No c/o pain when asked      Mobility  Bed Mobility Bed Mobility: Rolling Right;Right Sidelying to Sit Rolling Right: 5: Supervision Rolling Left: 6: Modified independent (Device/Increase time);With rail Right Sidelying to Sit: 5: Supervision Left Sidelying to Sit: 6: Modified independent (Device/Increase time) Details for Bed Mobility Assistance: gave cues for technique as pt was trying to do more of supine to sit technique. Transfers Transfers: Sit to Stand;Stand to Sit Sit to Stand: 5: Supervision;From bed;From toilet;6: Modified independent (Device/Increase time) Stand to Sit: 5: Supervision;To bed;To toilet;6: Modified independent (Device/Increase time) Details for Transfer Assistance: Supervision for sit <> stand from toilet. Cues for precautions Ambulation/Gait Ambulation/Gait Assistance: 5: Supervision Ambulation Distance (Feet): 200 Feet Assistive device: None Ambulation/Gait Assistance Details: Supervision for safety Gait Pattern: Within Functional Limits Gait velocity: decrease Stairs: Yes Stairs Assistance: 5: Supervision Stairs Assistance Details (indicate cue type and  reason): Supervision for safety  Stair Management Technique: One rail Right Number of Stairs: 3    Exercises     PT Diagnosis:    PT Problem List:   PT Treatment Interventions:       PT Goals(Current goals can be found in the care plan section)    Visit Information  Last PT Received On: 09/11/13 Assistance Needed: +1 History of Present Illness: Donald Roberts is a 62 y.o. male who was admitted after elective C4-5 and C5-6 ACDF       Prior Functioning  Home Living Family/patient expects to be discharged to:: Private residence Living Arrangements: Spouse/significant other Available Help at Discharge: Family;Available 24 hours/day Type of Home: House Home Access: Stairs to enter [employer] of Steps: 3 Entrance Stairs-Rails: Right Home Layout: Two level;Able to live on main level with bedroom/bathroom Home Equipment: Grab bars - tub/shower;Hand held shower head;Grab bars - toilet;Shower seat Prior Function Level of Independence: Independent Communication Communication: No difficulties Dominant Hand: Right    Cognition  Cognition Arousal/Alertness: Awake/alert Behavior During Therapy: WFL for tasks assessed/performed Overall Cognitive Status: Within Functional Limits for tasks assessed    Extremity/Trunk Assessment Upper Extremity Assessment Upper Extremity Assessment: Overall WFL for tasks assessed Lower Extremity Assessment Lower Extremity Assessment: Defer to PT evaluation   Balance    End of Session PT - End of Session Equipment Utilized During Treatment: Gait belt;Cervical collar Activity Tolerance: Patient tolerated treatment well Patient left: in chair;with call bell/phone within reach;with family/visitor present Nurse Communication: Mobility status  GP Functional Limitation: Mobility: Walking and moving around Mobility: Walking and Moving Around Current Status [phone]): At least 1 percent but less than 20 percent impaired, limited or  restricted Mobility: Walking and Moving Around Goal Status [phone]): At least 1 percent but less than 20 percent impaired,  limited or restricted Mobility: Walking and Moving Around Discharge Status [phone]): At least 1 percent but less than 20 percent impaired, limited or restricted   Charlcie Prisco 09/11/2013, 2:20 PM  [place], PT DPT [phone]

## 2013-09-11 NOTE — Op Note (Signed)
Donald Roberts, Donald Roberts NO.:  [id number]  MEDICAL RECORD NO.:  [id number]  LOCATION:  [id number]                        FACILITY:  [facility]  PHYSICIAN:  Hilda Lias, M.D.   DATE OF BIRTH:  [birth year]  DATE OF PROCEDURE:  09/10/2013 DATE OF DISCHARGE:                              OPERATIVE REPORT   PREOPERATIVE DIAGNOSES:  C4-C5, C5-C6 degenerative disk disease with radiculopathy.  Status post fusion C6-C7.  POSTOPERATIVE DIAGNOSES:  C4-C5, C5-C6 degenerative disk disease with radiculopathy.  Status post fusion C6-C7.  PROCEDURE:  Anterior C4-C5, C5-C6 diskectomy; decompression of spinal cord; lysis of adhesions; bilateral foraminotomy; interbody fusion with cage, plate; microscopy.  SURGEON:  Hilda Lias, M.D.  CLINICAL HISTORY:  Mr. Bowdish is a gentleman who had been complaining of neck pain _with radiation_________ to the left shoulder for several months.  He is not any better.  He had conservative treatment including epidural injection without any relief of the pain.  X-rays showed that he has had degenerative disk disease with stenosis at the level of C4-C5, C5-C6. The area where he had surgery C6-C7 looks normal.  Surgery was advised and the patient knew the risk with the surgery as well as the possibility of need of further surgery through the posterior approach.  DESCRIPTION OF PROCEDURE:  The patient was taken to the OR, and after intubation, the left side _of the neck_________ was cleaned with DuraPrep.  Drapes were applied.  Transverse incision was made through the skin and subcutaneous tissue straight down to the cervical spine.  The patient had quite a bit of adhesion.  Lysis was accomplished.  The x-rays showed that indeed we were at the right at the level of C5-C6.  From then on, we brought the microscope into the area and we started opening the disk at the level C5-C6 with quite a bit of degenerative disk disease.  We had to drill all the way  down to the posterior ligament.  The posterior ligament was quite adherent with quite a bit of scar tissue. Decompression of the spinal cord as well as both C6 nerve root was achieved.  At the level of C4-C5, he had degenerative disk disease also with compromise of the C5 nerve root.  The patient also has quite a bit of adhesion.  Lysis was accomplished and decompression of both C5 nerve root was done.  Then, the endplates were drilled. Not having a lordotic 9 mms bone allograft ,  a cage of 9 mm at level of C4-C5 _and 35mms_________ at the level of C5-C6 with autograft and the DBX was inserted followed by a plate using 6 screws.  Lateral cervical spine x-ray showed good position of the cages and the screws.  From then on, the area was irrigated.  Hemostasis was done.  I left a drain in the cervical area. Then, the wound was closed with Vicryl and Steri-Strips.          ______________________________ Hilda Lias, M.D.     EB/MEDQ  D:  09/10/2013  T:  09/11/2013  Job:  [id number]

## 2013-09-11 NOTE — Discharge Summary (Signed)
   Physician Discharge Summary  Patient ID: Donald Roberts MRN: [id number] DOB/AGE: 01-15-1951 62 y.o.  Admit date: 09/10/2013 Discharge date: 09/11/2013  Admission Diagnoses: Cervical spondylosis with radiculopathy  Discharge Diagnoses: Same Active Problems:   * No active hospital problems. *   Discharged Condition: Stable  Hospital Course:  Mrs. Donald Roberts is a 62 y.o. male who was admitted after elective C4-5 and C5-6 ACDF. Postoperatively he was at his baseline, and on POD# 1 he was ambulating without difficulty, tolerating diet without significant dysphagia, and voiding without difficulty. He had some left shoulder pain which was controlled with oral medication and steroids, likely a post-op radiculitis. He was stable for discharge.  Treatments: Surgery - ACDF C4-5, C5-6  Discharge Exam: Blood pressure 127/62, pulse 71, temperature 97.7 F (36.5 C), temperature source Oral, resp. rate 18, height 5\' 10"  (1.778 m), weight 83.915 kg (185 lb), SpO2 97.00%. Awake, alert, oriented Speech fluent, appropriate CN grossly intact 5/5 BUE/BLE Wound c/d/i  Follow-up: Follow-up in Dr. Cassandria Santee office [facility] [phone]) in 2-3 weeks  Disposition: Home     Medication List    STOP taking these medications       aspirin 81 MG tablet      TAKE these medications       diazepam 5 MG tablet  Commonly known as:  VALIUM  Take 5 mg by mouth as needed for anxiety.     famotidine-calcium carbonate-magnesium hydroxide 10-800-165 MG Chew chewable tablet  Commonly known as:  PEPCID COMPLETE  Chew 1 tablet by mouth daily as needed.     methylPREDNIsolone 4 MG tablet  Commonly known as:  MEDROL DOSPACK  follow package directions     nitroGLYCERIN 0.4 MG SL tablet  Commonly known as:  NITROSTAT  Place 0.4 mg under the tongue every 5 (five) minutes as needed for chest pain.     oxyCODONE-acetaminophen 10-325 MG per tablet  Commonly known as:  PERCOCET   Take 1 tablet by mouth every 4 (four) hours as needed for pain.     pravastatin 40 MG tablet  Commonly known as:  PRAVACHOL  Take 40 mg by mouth daily.     ramipril 5 MG capsule  Commonly known as:  ALTACE  Take 5 mg by mouth daily.         SignedLisbeth Renshaw, C 09/11/2013, 9:23 AM

## 2013-09-15 ENCOUNTER — Encounter (HOSPITAL_COMMUNITY): Payer: Self-pay | Admitting: Neurosurgery

## 2013-10-05 ENCOUNTER — Other Ambulatory Visit: Payer: Self-pay | Admitting: Neurosurgery

## 2013-10-05 DIAGNOSIS — M5412 Radiculopathy, cervical region: Secondary | ICD-10-CM

## 2013-10-06 ENCOUNTER — Ambulatory Visit
Admission: RE | Admit: 2013-10-06 | Discharge: 2013-10-06 | Disposition: A | Discharge status: Home / Self Care | Payer: No Typology Code available for payment source | Source: Ambulatory Visit | Attending: Neurosurgery | Admitting: Neurosurgery

## 2013-10-06 VITALS — BP 172/82 | HR 62

## 2013-10-06 DIAGNOSIS — M5412 Radiculopathy, cervical region: Secondary | ICD-10-CM

## 2013-10-06 MED ORDER — ONDANSETRON HCL 4 MG/2ML IJ SOLN
4.0000 mg | Freq: Once | INTRAMUSCULAR | Status: AC
  Administered 2013-10-06: 4 mg via INTRAMUSCULAR

## 2013-10-06 MED ORDER — HYDROMORPHONE HCL PF 2 MG/ML IJ SOLN
2.0000 mg | Freq: Once | INTRAMUSCULAR | Status: AC
  Administered 2013-10-06: 2 mg via INTRAMUSCULAR

## 2013-10-06 MED ORDER — IOHEXOL 300 MG/ML  SOLN
10.0000 mL | Freq: Once | INTRAMUSCULAR | Status: AC | PRN
  Administered 2013-10-06: 10 mL via INTRATHECAL

## 2013-10-06 MED ORDER — DIAZEPAM 5 MG PO TABS
10.0000 mg | ORAL_TABLET | Freq: Once | ORAL | Status: AC
  Administered 2013-10-06: 10 mg via ORAL

## 2013-10-07 ENCOUNTER — Other Ambulatory Visit: Payer: Self-pay

## 2014-01-21 ENCOUNTER — Other Ambulatory Visit (HOSPITAL_COMMUNITY): Payer: Self-pay | Admitting: Otolaryngology

## 2014-01-21 DIAGNOSIS — R1310 Dysphagia, unspecified: Secondary | ICD-10-CM

## 2014-01-24 ENCOUNTER — Other Ambulatory Visit (HOSPITAL_COMMUNITY): Payer: Self-pay | Admitting: Otolaryngology

## 2014-01-24 DIAGNOSIS — R1310 Dysphagia, unspecified: Secondary | ICD-10-CM

## 2014-01-26 ENCOUNTER — Telehealth: Payer: Self-pay | Admitting: Internal Medicine

## 2014-01-26 NOTE — Telephone Encounter (Signed)
[facility] sent to Dr. Jonny RuizJohn. 01/26/14/ss.

## 2014-01-27 ENCOUNTER — Ambulatory Visit (HOSPITAL_COMMUNITY)
Admission: RE | Admit: 2014-01-27 | Discharge: 2014-01-27 | Disposition: A | Discharge status: Home / Self Care | Payer: No Typology Code available for payment source | Source: Ambulatory Visit | Attending: Otolaryngology | Admitting: Otolaryngology

## 2014-01-27 DIAGNOSIS — R1313 Dysphagia, pharyngeal phase: Secondary | ICD-10-CM | POA: Insufficient documentation

## 2014-01-27 DIAGNOSIS — R1310 Dysphagia, unspecified: Secondary | ICD-10-CM

## 2014-01-27 NOTE — Procedures (Signed)
Objective Swallowing Evaluation: Modified Barium Swallowing Study  Patient Details  Name: Donald Roberts MRN: [id number] Date of Birth: 05/09/1951  Today's Date: 01/27/2014 Time: 1142-1207 SLP Time Calculation (min): 25 min  Past Medical History:  Past Medical History  Diagnosis Date  . Abdominal pain, generalized 07/18/2008  . ALLERGIC RHINITIS 07/18/2008  . COLONIC POLYPS, HX OF 07/18/2008  . CORONARY ARTERY DISEASE 07/18/2008  . DIVERTICULOSIS, COLON 07/18/2008  . Gross hematuria 07/18/2008  . HYPERLIPIDEMIA 07/18/2008  . TOBACCO ABUSE 04/06/2009  . Back pain, chronic 10/01/2011  . Degenerative arthritis of right knee 10/01/2011  . Chronic low back pain 12/25/2012  . Headache(784.0)     Hx: of Migraines as a child   Past Surgical History:  Past Surgical History  Procedure Laterality Date  . S/p cervical disease x 2    . Lumbar surgury      dr botero/ns; 3 level fusion per pt  . Cardiac catheterization    . Colonoscopy w/ biopsies and polypectomy      Hx: of  . Anterior cervical decomp/discectomy fusion N/A 09/10/2013    Procedure: ANTERIOR CERVICAL DECOMPRESSION/DISCECTOMY FUSION CERVICAL FOUR-FIVE,CERVICAL FIVE-SIX WITH PLATING AND BONE GRAFT;  Surgeon: Karn Cassis, MD;  Location: [facility];  Service: Neurosurgery;  Laterality: N/A;   HPI:  Pt is a 63 year old male arriving for an outpatient MBS, referred by ENT, due to complaint of difficulty swallowing. He has a history of 3 ACDF procedures and has two pieces of hardware stretching from C4 to T1(?). He reports difficulty initiating swallow and a feeling of solid foods sticking in his throat. He denies any history of pna and reports he is otherwise healthy.      Assessment / Plan / Recommendation Clinical Impression  Dysphagia Diagnosis: Moderate pharyngeal phase dysphagia Clinical impression: Pt presents with a moderate oropharyngeal dysphagia due to mechanical obstruction of epiglottic deflection and possible restriction  of hyolaryngeal mobility. The pt has a narrowed oropharyngeal cavity due to presence of cervical harware. During the swallow the epiglottis cannot pass the posterior pharyngeal wall to deflect and protect the airway. There is mild silent aspiration before the swallow with thin liquids. With solid textures there is moderate vallecular and pyriform residuals, again due to decreased deflection of the epiglottis, and possibly due to decreased opening of UES.   When instructed to turn his head left or right, airway protection significantly improves, likely due to increased space to the left/right of the posterior pharyngeal wall which allows for increased movement of the epiglottis. With a head turn, aspiration is eliminated even with large  sips, there is minimal residual and there is less sensation of difficutly initiating swallow subjectively per pt. Pt even reflects that he has done this instictively at home to help him swallow. At this time, recommend pt continue regular texture diet with thin liquids, avoiding any very difficult textures, but using the head turn left to swallow. Also advised pt to be aware that this problem may increase in severity with age and he is certainly at risk for aspiration events.     Treatment Recommendation  No treatment recommended at this time    Diet Recommendation Regular;Thin liquid   Liquid Administration via: Cup;Straw Medication Administration: Whole meds with liquid Supervision: Patient able to self feed Postural Changes and/or Swallow Maneuvers: Seated upright 90 degrees;Head turn left during swallow    Other  Recommendations Oral Care Recommendations: Oral care BID   Follow Up Recommendations  None  Frequency and Duration        Pertinent Vitals/Pain NA    SLP Swallow Goals     General HPI: Pt is a 63 year old male arriving for an outpatient MBS, referred by ENT, due to complaint of difficulty swallowing. He has a history of 3 ACDF procedures and  has two pieces of hardware stretching from C4 to T1(?). He reports difficulty initiating swallow and a feeling of solid foods sticking in his throat. He denies any history of pna and reports he is otherwise healthy.  Type of Study: Modified Barium Swallowing Study Reason for Referral: Objectively evaluate swallowing function Previous Swallow Assessment: none Diet Prior to this Study: Regular;Thin liquids Temperature Spikes Noted: No Respiratory Status: Room air History of Recent Intubation: No Behavior/Cognition: Alert;Cooperative;Pleasant mood Oral Cavity - Dentition: Adequate natural dentition Oral Motor / Sensory Function: Within functional limits Self-Feeding Abilities: Able to feed self Patient Positioning: Upright in chair Baseline Vocal Quality: Clear Volitional Cough: Strong Volitional Swallow: Able to elicit Anatomy: Other (Comment) (ACDF C4-T1) Pharyngeal Secretions: Not observed secondary MBS    Reason for Referral Objectively evaluate swallowing function   Oral Phase Oral Preparation/Oral Phase Oral Phase: WFL   Pharyngeal Phase Pharyngeal Phase Pharyngeal Phase: Impaired Pharyngeal - Thin Pharyngeal - Thin Cup: Premature spillage to pyriform sinuses;Reduced epiglottic inversion;Reduced anterior laryngeal mobility;Reduced laryngeal elevation;Reduced airway/laryngeal closure;Penetration/Aspiration before swallow;Penetration/Aspiration after swallow;Trace aspiration;Pharyngeal residue - valleculae;Pharyngeal residue - pyriform sinuses;Compensatory strategies attempted (Comment) (Head Turn Right, left, tilt left, chin tuck) Penetration/Aspiration details (thin cup): Material enters airway, passes BELOW cords without attempt by patient to eject out (silent aspiration) Pharyngeal - Thin Straw: Premature spillage to pyriform sinuses;Reduced epiglottic inversion;Reduced anterior laryngeal mobility;Reduced laryngeal elevation;Reduced airway/laryngeal closure;Penetration/Aspiration  before swallow;Penetration/Aspiration after swallow;Trace aspiration;Pharyngeal residue - valleculae;Pharyngeal residue - pyriform sinuses Penetration/Aspiration details (thin straw): Material enters airway, passes BELOW cords without attempt by patient to eject out (silent aspiration) Pharyngeal - Solids Pharyngeal - Puree: Reduced epiglottic inversion;Reduced anterior laryngeal mobility;Reduced laryngeal elevation;Reduced airway/laryngeal closure;Pharyngeal residue - valleculae;Pharyngeal residue - pyriform sinuses Pharyngeal - Regular: Reduced epiglottic inversion;Reduced anterior laryngeal mobility;Reduced laryngeal elevation;Reduced airway/laryngeal closure;Pharyngeal residue - valleculae;Pharyngeal residue - pyriform sinuses  Cervical Esophageal Phase    GO    Cervical Esophageal Phase Cervical Esophageal Phase: [facility]    Functional Assessment Tool Used: clinical judgement Functional Limitations: Swallowing Swallow Current Status (N0272(G8996): At least 20 percent but less than 40 percent impaired, limited or restricted Swallow Goal Status [phone](G8997): At least 1 percent but less than 20 percent impaired, limited or restricted Swallow Discharge Status [phone](G8998): At least 1 percent but less than 20 percent impaired, limited or restricted    Sharlett Lienemann, Riley NearingBonnie Caroline 01/27/2014, 2:09 PM

## 2014-05-06 ENCOUNTER — Telehealth: Payer: Self-pay

## 2014-05-06 NOTE — Telephone Encounter (Signed)
Received a fax from [facility] stating patient's insurance is not covering Temazepam 30 mg. Per Dr Alwyn Ren he advised to contact patient to let him know a sleep medicine referral can be placed if patient wants to pursue this. When I spoke to the patient he states he will just pay cash at the pharmacy when he gets back in town.

## 2014-12-27 ENCOUNTER — Emergency Department: Payer: Self-pay | Admitting: Emergency Medicine

## 2015-03-13 ENCOUNTER — Encounter (HOSPITAL_COMMUNITY): Payer: Self-pay | Admitting: *Deleted

## 2015-03-13 ENCOUNTER — Other Ambulatory Visit: Payer: Self-pay | Admitting: Neurosurgery

## 2015-03-13 DIAGNOSIS — M5021 Other cervical disc displacement,  high cervical region: Secondary | ICD-10-CM

## 2015-03-13 NOTE — Progress Notes (Signed)
Anesthesia Chart Review:  Pt is 64 year old male scheduled for C2-3 ACDF on 03/14/2015 with Dr. Jeral FruitBotero.   Pt is same day work up.   Cardiologist is Dr. Donnie Ahoilley. Saw on 08/23/2014. F/u scheduled for one year.   PMH includes: CAD, hyperlipidemia. Current smoker. BMI 26.5. S/p C4-5, C5-6 ACDF 09/10/13.   Nuclear stress test 05/28/2012: 1. Normal stress Cardiolite study with no evidence ischemia or infarction 2. Normal quantitative gated SPECT EF of 60% with normal wall motion and normal wall thickening.   Cardiac cath 04/11/2009: Coronaries: Left main was normal. The LAD had a mid long 30% stenosis. The mid diagonal was large. There was a proximal long 25% stenosis. Circumflex was a large codominant vessel. In the AV groove, there was mid 25% stenosis. Mid obtuse marginal was small with a mid 50% stenosis in a superior branch. There was a PDA that was moderate size with 25% stenosis. Right coronary artery was codominant. There was a mid 40-50% stenosis followed by a long 30% stenosis. The PDA was moderate sizewith mid 25% stenosis. Conclusion: Nonobstructive coronary artery disease. Preserved ejection fraction. The plaque seems to be relatively stable compared with his 2004 catheterization.  If EKG and labs obtained DOS are acceptable, I anticipate pt can proceed as scheduled.   Rica Mastngela Chelesa Weingartner, FNP-BC [facility] Phone: [phone] 03/13/2015 2:37 PM

## 2015-03-13 NOTE — Progress Notes (Signed)
Pt denies SOB and chest pain but is under the care of Dr. Viann FishSpencer Tilley, cardiology. Pt made aware to stop taking Stop taking Aspirin, otc vitamins  and herbal medications. Do not take any NSAIDs ie: Ibuprofen, Advil, Naproxen or any medication containing Aspirin. Pt verbalized understanding of all instructions. Pt chart forwarded to Marylene LandAngela, NP   ( anesthesia) for review of cardiac history.

## 2015-03-13 NOTE — H&P (Signed)
Donald RogersRonny O Roberts is an 64 y.o. male.   Chief Complaint: neck and left shoulder pain HPI: patient who has been complaining of neck pain with radiation to the left shoulder for many years with some relief of the pain with epidural injections. Lately he is gettiong worse and wants to go ahead with surgery. Previously he has had ACD from c4 down  Past Medical History  Diagnosis Date  . Abdominal pain, generalized 07/18/2008  . ALLERGIC RHINITIS 07/18/2008  . COLONIC POLYPS, HX OF 07/18/2008  . CORONARY ARTERY DISEASE 07/18/2008  . DIVERTICULOSIS, COLON 07/18/2008  . Gross hematuria 07/18/2008  . HYPERLIPIDEMIA 07/18/2008  . TOBACCO ABUSE 04/06/2009  . Back pain, chronic 10/01/2011  . Degenerative arthritis of right knee 10/01/2011  . Chronic low back pain 12/25/2012  . Headache(784.0)     Hx: of Migraines as a child  . Heart murmur   . Carpal tunnel syndrome on left     Past Surgical History  Procedure Laterality Date  . S/p cervical disease x 2    . Lumbar surgury      dr Donald Roberts/ns; 3 level fusion per pt  . Cardiac catheterization    . Colonoscopy w/ biopsies and polypectomy      Hx: of  . Anterior cervical decomp/discectomy fusion N/A 09/10/2013    Procedure: ANTERIOR CERVICAL DECOMPRESSION/DISCECTOMY FUSION CERVICAL FOUR-FIVE,CERVICAL FIVE-SIX WITH PLATING AND BONE GRAFT;  Surgeon: Donald CassisErnesto M Kameryn Davern, MD;  Location: [facility];  Service: Neurosurgery;  Laterality: N/A;    Family History  Problem Relation Age of Onset  . Heart disease Other    Social History:  reports that he has been smoking Cigarettes.  He has been smoking about 1.00 pack per day. He has never used smokeless tobacco. He reports that he drinks alcohol. He reports that he does not use illicit drugs.  Allergies: No Known Allergies  No prescriptions prior to admission    No results found for this or any previous visit (from the past 48 hour(s)). No results found.  Review of Systems  Constitutional: Negative.   Eyes:  Negative.   Respiratory: Negative.   Cardiovascular: Negative.   Gastrointestinal: Negative.   Musculoskeletal: Positive for neck pain.  Skin: Negative.   Neurological: Positive for headaches.  Endo/Heme/Allergies: Negative.   Psychiatric/Behavioral: Negative.     There were no vitals taken for this visit. Physical Exam hent, nl. Neck, anterior scar. Pain with movement specially to the left shoulder. Tenderness in the left trapezius. Cv, nl. Lungs, clear. Abdomen, soft. Extremities some mild tenderness in both shoulders. NEURO, no weakness with normal sensation. Ct from December 2014 shows stenosis at c23 to the left.  Assessment/Plan Patient was given the choice of posterior approach with foraminotomy and no fusion but he wants to go ahead with an anterior fusion. i did show the ct to dr Donald Roberts and we believe we have the spacr to do it. If the ct scan of today or tomorrow  Prior to surgery is unchanged the procedure will be    A c2-3 fusion. He and his wife know the risks and benefits including worsening of his dysphagia  Donald Roberts M 03/13/2015, 4:55 PM

## 2015-03-14 ENCOUNTER — Ambulatory Visit (HOSPITAL_COMMUNITY): Payer: BLUE CROSS/BLUE SHIELD

## 2015-03-14 ENCOUNTER — Encounter (HOSPITAL_COMMUNITY): Payer: Self-pay | Admitting: *Deleted

## 2015-03-14 ENCOUNTER — Ambulatory Visit (HOSPITAL_COMMUNITY): Payer: BLUE CROSS/BLUE SHIELD | Admitting: Emergency Medicine

## 2015-03-14 ENCOUNTER — Ambulatory Visit
Admission: RE | Admit: 2015-03-14 | Discharge: 2015-03-14 | Disposition: A | Discharge status: Home / Self Care | Payer: BLUE CROSS/BLUE SHIELD | Source: Ambulatory Visit | Attending: Neurosurgery | Admitting: Neurosurgery

## 2015-03-14 ENCOUNTER — Encounter (HOSPITAL_COMMUNITY)
Admission: RE | Disposition: A | Discharge status: Home / Self Care | Payer: Self-pay | Source: Ambulatory Visit | Attending: Neurosurgery

## 2015-03-14 ENCOUNTER — Inpatient Hospital Stay (HOSPITAL_COMMUNITY)
Admission: RE | Admit: 2015-03-14 | Discharge: 2015-03-15 | DRG: 473 | Disposition: A | Discharge status: Home / Self Care | Payer: BLUE CROSS/BLUE SHIELD | Source: Ambulatory Visit | Attending: Neurosurgery | Admitting: Neurosurgery

## 2015-03-14 DIAGNOSIS — E785 Hyperlipidemia, unspecified: Secondary | ICD-10-CM | POA: Diagnosis present

## 2015-03-14 DIAGNOSIS — I2510 Atherosclerotic heart disease of native coronary artery without angina pectoris: Secondary | ICD-10-CM | POA: Diagnosis present

## 2015-03-14 DIAGNOSIS — Z419 Encounter for procedure for purposes other than remedying health state, unspecified: Secondary | ICD-10-CM

## 2015-03-14 DIAGNOSIS — M4722 Other spondylosis with radiculopathy, cervical region: Principal | ICD-10-CM | POA: Diagnosis present

## 2015-03-14 DIAGNOSIS — Z8249 Family history of ischemic heart disease and other diseases of the circulatory system: Secondary | ICD-10-CM

## 2015-03-14 DIAGNOSIS — Z981 Arthrodesis status: Secondary | ICD-10-CM | POA: Diagnosis not present

## 2015-03-14 DIAGNOSIS — M5021 Other cervical disc displacement,  high cervical region: Secondary | ICD-10-CM

## 2015-03-14 DIAGNOSIS — G8929 Other chronic pain: Secondary | ICD-10-CM | POA: Diagnosis present

## 2015-03-14 DIAGNOSIS — F17210 Nicotine dependence, cigarettes, uncomplicated: Secondary | ICD-10-CM | POA: Diagnosis present

## 2015-03-14 DIAGNOSIS — M542 Cervicalgia: Secondary | ICD-10-CM | POA: Diagnosis present

## 2015-03-14 HISTORY — PX: ANTERIOR CERVICAL DECOMP/DISCECTOMY FUSION: SHX1161

## 2015-03-14 HISTORY — DX: Carpal tunnel syndrome, left upper limb: G56.02

## 2015-03-14 HISTORY — DX: Cardiac murmur, unspecified: R01.1

## 2015-03-14 LAB — BASIC METABOLIC PANEL
Anion gap: 8 (ref 5–15)
BUN: 14 mg/dL (ref 6–23)
CO2: 26 mmol/L (ref 19–32)
Calcium: 8.9 mg/dL (ref 8.4–10.5)
Chloride: 103 mmol/L (ref 96–112)
Creatinine, Ser: 0.84 mg/dL (ref 0.50–1.35)
GFR calc Af Amer: >90 mL/min (ref >90)
GFR calc non Af Amer: >90 mL/min (ref >90)
Glucose, Bld: 84 mg/dL (ref 70–99)
Potassium: 4.1 mmol/L (ref 3.5–5.1)
Sodium: 137 mmol/L (ref 135–145)

## 2015-03-14 LAB — CBC
HCT: 38 % — ABNORMAL LOW (ref 39.0–52.0)
Hemoglobin: 12.4 g/dL — ABNORMAL LOW (ref 13.0–17.0)
MCH: 28.6 pg (ref 26.0–34.0)
MCHC: 32.6 g/dL (ref 30.0–36.0)
MCV: 87.8 fL (ref 78.0–100.0)
Platelets: 227 10*3/uL (ref 150–400)
RBC: 4.33 MIL/uL (ref 4.22–5.81)
RDW: 14.2 % (ref 11.5–15.5)
WBC: 9.4 10*3/uL (ref 4.0–10.5)

## 2015-03-14 LAB — SURGICAL PCR SCREEN
MRSA, PCR: NEGATIVE
Staphylococcus aureus: NEGATIVE

## 2015-03-14 SURGERY — ANTERIOR CERVICAL DECOMPRESSION/DISCECTOMY FUSION 1 LEVEL
Anesthesia: General

## 2015-03-14 MED ORDER — HYDROMORPHONE HCL 1 MG/ML IJ SOLN
0.2500 mg | INTRAMUSCULAR | Status: DC | PRN
  Administered 2015-03-14 (×4): 0.5 mg via INTRAVENOUS

## 2015-03-14 MED ORDER — HEMOSTATIC AGENTS (NO CHARGE) OPTIME
TOPICAL | Status: DC | PRN
  Administered 2015-03-14: 1 via TOPICAL

## 2015-03-14 MED ORDER — GLYCOPYRROLATE 0.2 MG/ML IJ SOLN
INTRAMUSCULAR | Status: DC | PRN
  Administered 2015-03-14: 0.2 mg via INTRAVENOUS
  Administered 2015-03-14: .4 mg via INTRAVENOUS

## 2015-03-14 MED ORDER — HYDROMORPHONE HCL 1 MG/ML IJ SOLN
INTRAMUSCULAR | Status: AC
  Administered 2015-03-14: 0.5 mg via INTRAVENOUS
  Filled 2015-03-14: qty 1

## 2015-03-14 MED ORDER — ROCURONIUM BROMIDE 50 MG/5ML IV SOLN
INTRAVENOUS | Status: AC
  Filled 2015-03-14: qty 1

## 2015-03-14 MED ORDER — 0.9 % SODIUM CHLORIDE (POUR BTL) OPTIME
TOPICAL | Status: DC | PRN
  Administered 2015-03-14: 1000 mL

## 2015-03-14 MED ORDER — MIDAZOLAM HCL 2 MG/2ML IJ SOLN
INTRAMUSCULAR | Status: AC
  Filled 2015-03-14: qty 2

## 2015-03-14 MED ORDER — RAMIPRIL 5 MG PO CAPS
5.0000 mg | ORAL_CAPSULE | Freq: Every day | ORAL | Status: DC
  Filled 2015-03-14: qty 1

## 2015-03-14 MED ORDER — MORPHINE SULFATE 2 MG/ML IJ SOLN: 1.0000 mg | INTRAMUSCULAR | Status: DC | PRN

## 2015-03-14 MED ORDER — ONDANSETRON HCL 4 MG/2ML IJ SOLN
INTRAMUSCULAR | Status: DC | PRN
  Administered 2015-03-14 (×2): 4 mg via INTRAVENOUS

## 2015-03-14 MED ORDER — DEXAMETHASONE SODIUM PHOSPHATE 4 MG/ML IJ SOLN
4.0000 mg | Freq: Four times a day (QID) | INTRAMUSCULAR | Status: DC
  Administered 2015-03-14: 4 mg via INTRAVENOUS
  Filled 2015-03-14: qty 1

## 2015-03-14 MED ORDER — MENTHOL 3 MG MT LOZG
1.0000 | LOZENGE | OROMUCOSAL | Status: DC | PRN
  Administered 2015-03-14 – 2015-03-15 (×3): 3 mg via ORAL
  Filled 2015-03-14 (×3): qty 9

## 2015-03-14 MED ORDER — SODIUM CHLORIDE 0.9 % IV SOLN: 250.0000 mL | INTRAVENOUS | Status: DC

## 2015-03-14 MED ORDER — ROCURONIUM BROMIDE 100 MG/10ML IV SOLN
INTRAVENOUS | Status: DC | PRN
Start: 2015-03-14 — End: 2015-03-14
  Administered 2015-03-14 (×2): 10 mg via INTRAVENOUS
  Administered 2015-03-14: 40 mg via INTRAVENOUS

## 2015-03-14 MED ORDER — THROMBIN 5000 UNITS EX SOLR
OROMUCOSAL | Status: DC | PRN
  Administered 2015-03-14: 13:00:00 via TOPICAL

## 2015-03-14 MED ORDER — DIAZEPAM 5 MG PO TABS: 5.0000 mg | ORAL_TABLET | Freq: Four times a day (QID) | ORAL | Status: DC | PRN

## 2015-03-14 MED ORDER — FENTANYL CITRATE 0.05 MG/ML IJ SOLN
INTRAMUSCULAR | Status: AC
  Filled 2015-03-14: qty 5

## 2015-03-14 MED ORDER — CEFAZOLIN SODIUM-DEXTROSE 2-3 GM-% IV SOLR
2.0000 g | INTRAVENOUS | Status: AC
  Administered 2015-03-14: 2 g via INTRAVENOUS
  Filled 2015-03-14: qty 50

## 2015-03-14 MED ORDER — PROMETHAZINE HCL 25 MG/ML IJ SOLN: 6.2500 mg | INTRAMUSCULAR | Status: DC | PRN

## 2015-03-14 MED ORDER — DEXAMETHASONE SODIUM PHOSPHATE 4 MG/ML IJ SOLN
INTRAMUSCULAR | Status: DC | PRN
  Administered 2015-03-14: 4 mg via INTRAVENOUS

## 2015-03-14 MED ORDER — EPHEDRINE SULFATE 50 MG/ML IJ SOLN
INTRAMUSCULAR | Status: DC | PRN
  Administered 2015-03-14: 10 mg via INTRAVENOUS

## 2015-03-14 MED ORDER — PRAVASTATIN SODIUM 40 MG PO TABS
40.0000 mg | ORAL_TABLET | Freq: Every day | ORAL | Status: DC
  Filled 2015-03-14: qty 1

## 2015-03-14 MED ORDER — LACTATED RINGERS IV SOLN
INTRAVENOUS | Status: DC | PRN
  Administered 2015-03-14 (×2): via INTRAVENOUS

## 2015-03-14 MED ORDER — ONDANSETRON HCL 4 MG/2ML IJ SOLN: 4.0000 mg | INTRAMUSCULAR | Status: DC | PRN

## 2015-03-14 MED ORDER — NEOSTIGMINE METHYLSULFATE 10 MG/10ML IV SOLN
INTRAVENOUS | Status: DC | PRN
  Administered 2015-03-14: 1 mg via INTRAVENOUS
  Administered 2015-03-14: 3 mg via INTRAVENOUS

## 2015-03-14 MED ORDER — NITROGLYCERIN 0.4 MG SL SUBL: 0.4000 mg | SUBLINGUAL_TABLET | SUBLINGUAL | Status: DC | PRN

## 2015-03-14 MED ORDER — OXYCODONE-ACETAMINOPHEN 5-325 MG PO TABS
1.0000 | ORAL_TABLET | ORAL | Status: DC | PRN
  Administered 2015-03-14: 2 via ORAL

## 2015-03-14 MED ORDER — ACETAMINOPHEN 325 MG PO TABS: 650.0000 mg | ORAL_TABLET | ORAL | Status: DC | PRN

## 2015-03-14 MED ORDER — SODIUM CHLORIDE 0.9 % IJ SOLN: 3.0000 mL | INTRAMUSCULAR | Status: DC | PRN

## 2015-03-14 MED ORDER — NEOSTIGMINE METHYLSULFATE 10 MG/10ML IV SOLN
INTRAVENOUS | Status: AC
  Filled 2015-03-14: qty 3

## 2015-03-14 MED ORDER — LIDOCAINE HCL (CARDIAC) 20 MG/ML IV SOLN
INTRAVENOUS | Status: DC | PRN
  Administered 2015-03-14: 100 mg via INTRAVENOUS

## 2015-03-14 MED ORDER — THROMBIN 5000 UNITS EX SOLR: CUTANEOUS | Status: DC | PRN

## 2015-03-14 MED ORDER — DEXAMETHASONE 4 MG PO TABS
4.0000 mg | ORAL_TABLET | Freq: Four times a day (QID) | ORAL | Status: DC
  Administered 2015-03-14 – 2015-03-15 (×3): 4 mg via ORAL
  Filled 2015-03-14 (×3): qty 1

## 2015-03-14 MED ORDER — LACTATED RINGERS IV SOLN
Freq: Once | INTRAVENOUS | Status: AC
  Administered 2015-03-14: 11:00:00 via INTRAVENOUS

## 2015-03-14 MED ORDER — SODIUM CHLORIDE 0.9 % IV SOLN
INTRAVENOUS | Status: DC
  Administered 2015-03-14: 17:00:00 via INTRAVENOUS
  Administered 2015-03-15: 75 mL/h via INTRAVENOUS

## 2015-03-14 MED ORDER — PHENYLEPHRINE HCL 10 MG/ML IJ SOLN
10.0000 mg | INTRAMUSCULAR | Status: DC | PRN
  Administered 2015-03-14: 5 ug/min via INTRAVENOUS

## 2015-03-14 MED ORDER — MUPIROCIN 2 % EX OINT
1.0000 "application " | TOPICAL_OINTMENT | Freq: Once | CUTANEOUS | Status: AC
  Administered 2015-03-14: 1 via TOPICAL
  Filled 2015-03-14: qty 22

## 2015-03-14 MED ORDER — HYDROMORPHONE HCL 1 MG/ML IJ SOLN
INTRAMUSCULAR | Status: AC
  Filled 2015-03-14: qty 1

## 2015-03-14 MED ORDER — THROMBIN 5000 UNITS EX SOLR
CUTANEOUS | Status: DC | PRN
  Administered 2015-03-14 (×2): 5000 [IU] via TOPICAL

## 2015-03-14 MED ORDER — SODIUM CHLORIDE 0.9 % IJ SOLN: 3.0000 mL | Freq: Two times a day (BID) | INTRAMUSCULAR | Status: DC

## 2015-03-14 MED ORDER — ONDANSETRON HCL 4 MG/2ML IJ SOLN
INTRAMUSCULAR | Status: AC
  Filled 2015-03-14: qty 2

## 2015-03-14 MED ORDER — PHENYLEPHRINE 40 MCG/ML (10ML) SYRINGE FOR IV PUSH (FOR BLOOD PRESSURE SUPPORT)
PREFILLED_SYRINGE | INTRAVENOUS | Status: AC
  Filled 2015-03-14: qty 10

## 2015-03-14 MED ORDER — GLYCOPYRROLATE 0.2 MG/ML IJ SOLN
INTRAMUSCULAR | Status: AC
  Filled 2015-03-14: qty 4

## 2015-03-14 MED ORDER — FENTANYL CITRATE 0.05 MG/ML IJ SOLN
INTRAMUSCULAR | Status: DC | PRN
  Administered 2015-03-14 (×6): 50 ug via INTRAVENOUS
  Administered 2015-03-14: 25 ug via INTRAVENOUS
  Administered 2015-03-14: 50 ug via INTRAVENOUS
  Administered 2015-03-14: 25 ug via INTRAVENOUS

## 2015-03-14 MED ORDER — MIDAZOLAM HCL 5 MG/5ML IJ SOLN
INTRAMUSCULAR | Status: DC | PRN
Start: 2015-03-14 — End: 2015-03-14
  Administered 2015-03-14: 2 mg via INTRAVENOUS

## 2015-03-14 MED ORDER — PHENOL 1.4 % MT LIQD
1.0000 | OROMUCOSAL | Status: DC | PRN
Start: 2015-03-14 — End: 2015-03-15

## 2015-03-14 MED ORDER — PHENYLEPHRINE HCL 10 MG/ML IJ SOLN
INTRAMUSCULAR | Status: DC | PRN
  Administered 2015-03-14: 80 ug via INTRAVENOUS
  Administered 2015-03-14 (×4): 40 ug via INTRAVENOUS

## 2015-03-14 MED ORDER — ACETAMINOPHEN 650 MG RE SUPP
650.0000 mg | RECTAL | Status: DC | PRN
Start: 2015-03-14 — End: 2015-03-15

## 2015-03-14 MED ORDER — ZOLPIDEM TARTRATE 5 MG PO TABS: 5.0000 mg | ORAL_TABLET | Freq: Every evening | ORAL | Status: DC | PRN

## 2015-03-14 MED ORDER — PROPOFOL 10 MG/ML IV BOLUS
INTRAVENOUS | Status: DC | PRN
  Administered 2015-03-14: 170 mg via INTRAVENOUS

## 2015-03-14 MED ORDER — OXYCODONE-ACETAMINOPHEN 5-325 MG PO TABS
ORAL_TABLET | ORAL | Status: AC
  Filled 2015-03-14: qty 2

## 2015-03-14 MED ORDER — CEFAZOLIN SODIUM 1-5 GM-% IV SOLN
1.0000 g | Freq: Three times a day (TID) | INTRAVENOUS | Status: AC
  Administered 2015-03-14 – 2015-03-15 (×2): 1 g via INTRAVENOUS
  Filled 2015-03-14 (×2): qty 50

## 2015-03-14 MED ORDER — LIDOCAINE HCL 4 % MT SOLN
OROMUCOSAL | Status: DC | PRN
  Administered 2015-03-14: 4 mL via TOPICAL

## 2015-03-14 SURGICAL SUPPLY — 54 items
BENZOIN TINCTURE PRP APPL 2/3 (GAUZE/BANDAGES/DRESSINGS) ×3 IMPLANT
BIT DRILL SM SPINE QC 14 (BIT) ×3 IMPLANT
BLADE ULTRA TIP 2M (BLADE) ×3 IMPLANT
BNDG GAUZE ELAST 4 BULKY (GAUZE/BANDAGES/DRESSINGS) ×6 IMPLANT
BUR BARREL STRAIGHT FLUTE 4.0 (BURR) IMPLANT
BUR MATCHSTICK NEURO 3.0 LAGG (BURR) ×3 IMPLANT
CANISTER SUCT 3000ML PPV (MISCELLANEOUS) ×3 IMPLANT
CLOSURE WOUND 1/2 X4 (GAUZE/BANDAGES/DRESSINGS) ×1
CONT SPEC 4OZ CLIKSEAL STRL BL (MISCELLANEOUS) ×3 IMPLANT
COVER MAYO STAND STRL (DRAPES) ×3 IMPLANT
DRAIN JACKSON PRATT 10MM FLAT (MISCELLANEOUS) ×3 IMPLANT
DRAPE C-ARM 42X72 X-RAY (DRAPES) ×6 IMPLANT
DRAPE LAPAROTOMY 100X72 PEDS (DRAPES) ×3 IMPLANT
DRAPE MICROSCOPE LEICA (MISCELLANEOUS) ×3 IMPLANT
DRAPE POUCH INSTRU U-SHP 10X18 (DRAPES) ×3 IMPLANT
DRSG OPSITE POSTOP 3X4 (GAUZE/BANDAGES/DRESSINGS) ×3 IMPLANT
DURAPREP 6ML APPLICATOR 50/CS (WOUND CARE) ×3 IMPLANT
ELECT REM PT RETURN 9FT ADLT (ELECTROSURGICAL) ×3
ELECTRODE REM PT RTRN 9FT ADLT (ELECTROSURGICAL) ×1 IMPLANT
EVACUATOR SILICONE 100CC (DRAIN) ×3 IMPLANT
GAUZE SPONGE 4X4 12PLY STRL (GAUZE/BANDAGES/DRESSINGS) ×3 IMPLANT
GAUZE SPONGE 4X4 16PLY XRAY LF (GAUZE/BANDAGES/DRESSINGS) IMPLANT
GLOVE BIOGEL M 8.0 STRL (GLOVE) ×3 IMPLANT
GLOVE ECLIPSE 7.5 STRL STRAW (GLOVE) ×12 IMPLANT
GLOVE EXAM NITRILE LRG STRL (GLOVE) IMPLANT
GLOVE EXAM NITRILE MD LF STRL (GLOVE) IMPLANT
GLOVE EXAM NITRILE XL STR (GLOVE) IMPLANT
GLOVE EXAM NITRILE XS STR PU (GLOVE) IMPLANT
GLOVE INDICATOR 7.5 STRL GRN (GLOVE) ×3 IMPLANT
GLOVE INDICATOR 8.0 STRL GRN (GLOVE) ×6 IMPLANT
GOWN STRL REUS W/ TWL LRG LVL3 (GOWN DISPOSABLE) ×2 IMPLANT
GOWN STRL REUS W/ TWL XL LVL3 (GOWN DISPOSABLE) ×1 IMPLANT
GOWN STRL REUS W/TWL 2XL LVL3 (GOWN DISPOSABLE) ×6 IMPLANT
GOWN STRL REUS W/TWL LRG LVL3 (GOWN DISPOSABLE) ×4
GOWN STRL REUS W/TWL XL LVL3 (GOWN DISPOSABLE) ×2
HEMOSTAT POWDER SURGIFOAM 1G (HEMOSTASIS) ×6 IMPLANT
KIT BASIN OR (CUSTOM PROCEDURE TRAY) ×3 IMPLANT
KIT ROOM TURNOVER OR (KITS) ×3 IMPLANT
NEEDLE SPNL 22GX3.5 QUINCKE BK (NEEDLE) ×3 IMPLANT
NS IRRIG 1000ML POUR BTL (IV SOLUTION) ×3 IMPLANT
PACK LAMINECTOMY NEURO (CUSTOM PROCEDURE TRAY) ×3 IMPLANT
PATTIES SURGICAL .5 X1 (DISPOSABLE) ×3 IMPLANT
PLATE ANT CERV XTEND 1 LV 16 (Plate) ×3 IMPLANT
RUBBERBAND STERILE (MISCELLANEOUS) ×12 IMPLANT
SCREW XTD VAR 4.2 SELF TAP (Screw) ×12 IMPLANT
SPACER CERV FRGE 12X14X9-7 (Spacer) ×3 IMPLANT
SPONGE INTESTINAL PEANUT (DISPOSABLE) ×6 IMPLANT
SPONGE SURGIFOAM ABS GEL SZ50 (HEMOSTASIS) ×3 IMPLANT
STRIP CLOSURE SKIN 1/2X4 (GAUZE/BANDAGES/DRESSINGS) ×2 IMPLANT
SUT VIC AB 3-0 SH 8-18 (SUTURE) ×3 IMPLANT
SYR 20ML ECCENTRIC (SYRINGE) ×3 IMPLANT
TOWEL OR 17X24 6PK STRL BLUE (TOWEL DISPOSABLE) ×3 IMPLANT
TOWEL OR 17X26 10 PK STRL BLUE (TOWEL DISPOSABLE) ×3 IMPLANT
WATER STERILE IRR 1000ML POUR (IV SOLUTION) ×3 IMPLANT

## 2015-03-14 NOTE — Anesthesia Procedure Notes (Signed)
Procedure Name: Intubation Date/Time: 03/14/2015 12:37 PM Performed by: Merdis Delay Pre-anesthesia Checklist: Patient identified, Timeout performed, Emergency Drugs available, Suction available and Patient being monitored Patient Re-evaluated:Patient Re-evaluated prior to inductionOxygen Delivery Method: Circle system utilized Preoxygenation: Pre-oxygenation with 100% oxygen Intubation Type: IV induction Ventilation: Mask ventilation without difficulty Laryngoscope Size: Mac and 4 Grade View: Grade III Tube type: Oral Tube size: 7.5 mm Number of attempts: 1 Airway Equipment and Method: Stylet and LTA kit utilized Placement Confirmation: ETT inserted through vocal cords under direct vision,  breath sounds checked- equal and bilateral,  positive ETCO2 and CO2 detector Secured at: 22 cm Tube secured with: Tape Dental Injury: Teeth and Oropharynx as per pre-operative assessment  Difficulty Due To: Difficulty was anticipated Comments: Recessed chin- anterior airway. Prominent teeth.

## 2015-03-14 NOTE — Anesthesia Preprocedure Evaluation (Signed)
Anesthesia Evaluation  Patient identified by MRN, date of birth, ID band  Airway Mallampati: II  TM Distance: >3 FB Neck ROM: Full    Dental   Pulmonary Current Smoker,  breath sounds clear to auscultation        Cardiovascular + CAD Rhythm:Regular Rate:Normal     Neuro/Psych    GI/Hepatic negative GI ROS, Neg liver ROS,   Endo/Other  negative endocrine ROS  Renal/GU negative Renal ROS     Musculoskeletal   Abdominal   Peds  Hematology   Anesthesia Other Findings   Reproductive/Obstetrics                             Anesthesia Physical Anesthesia Plan  ASA: III  Anesthesia Plan: General   Post-op Pain Management:    Induction: Intravenous  Airway Management Planned: Oral ETT  Additional Equipment:   Intra-op Plan:   Post-operative Plan: Extubation in OR  Informed Consent: I have reviewed the patients History and Physical, chart, labs and discussed the procedure including the risks, benefits and alternatives for the proposed anesthesia with the patient or authorized representative who has indicated his/her understanding and acceptance.   Dental advisory given  Plan Discussed with: CRNA and Anesthesiologist  Anesthesia Plan Comments:         Anesthesia Quick Evaluation

## 2015-03-14 NOTE — Transfer of Care (Signed)
Immediate Anesthesia Transfer of Care Note  Patient: Donald Roberts  Procedure(s) Performed: Procedure(s): ANTERIOR CERVICAL DECOMPRESSION/DISCECTOMY FUSION  CERVICAL TWO-THREE (N/A)  Patient Location: PACU  Anesthesia Type:General  Level of Consciousness: awake, alert  and oriented  Airway & Oxygen Therapy: Patient Spontanous Breathing and Patient connected to nasal cannula oxygen  Post-op Assessment: Report given to RN and Post -op Vital signs reviewed and stable  Post vital signs: Reviewed and stable  Last Vitals:  Filed Vitals:   03/14/15 1023  BP: 119/64  Pulse: 51  Temp: 36.2 C  Resp: 18    Complications: No apparent anesthesia complications

## 2015-03-14 NOTE — Progress Notes (Signed)
Orthopedic Tech Progress Note Patient Details:  Donald RogersRonny O Backer 09/13/1951 [id number]  Patient ID: Donald Rogersonny O Macbeth, male   DOB: 06/02/1951, 64 y.o.   MRN: [id number] RN stated that pt already has soft collar  Nikki DomCrawford, Brileigh Sevcik 03/14/2015, 4:32 PM

## 2015-03-14 NOTE — Progress Notes (Signed)
Patient arrived to room from PACU with family. Safety precautions and orders reviewed with patient/family. Call light and possessions within reach. RN asked about soft collar and patient said it is at home. RN recommended for patient to have it applied and explained the risks versus benefits. Family/friend at bedside appears upset for unknown reasons when RN and patient were discussing the importance of having the collar. RN direct to patient/family that no one have to go home and get it if they do not want to. Ortho tech already called to place a collar if needed. Patient then verbalized that "one of them will do it for me". Ortho tech called and canceled order. Full liquid order at this time. Will continue to monitor.    Sim BoastHavy, RN

## 2015-03-14 NOTE — Anesthesia Postprocedure Evaluation (Signed)
  Anesthesia Post-op Note  Patient: Donald Roberts  Procedure(s) Performed: Procedure(s): ANTERIOR CERVICAL DECOMPRESSION/DISCECTOMY FUSION  CERVICAL TWO-THREE (N/A)  Patient Location: PACU  Anesthesia Type:General  Level of Consciousness: awake  Airway and Oxygen Therapy: Patient Spontanous Breathing  Post-op Pain: mild  Post-op Assessment: Post-op Vital signs reviewed  Post-op Vital Signs: Reviewed  Last Vitals:  Filed Vitals:   03/14/15 1556  BP: 120/51  Pulse: 59  Temp: 36.6 C  Resp:     Complications: No apparent anesthesia complications

## 2015-03-15 NOTE — Discharge Instructions (Signed)
Recommend daily vitamin C supplement due to being an active smoker. Take 60 mg of Vitamin C daily. Vitamin C rich foods include most fruits, especially strawberries, kiwis, oranges, and grapefruit; green vegetables, and bell peppers.

## 2015-03-15 NOTE — Progress Notes (Signed)
OT NOTE  PT EVALUATION / OT SIGN-OFF  OT order received and patient screened with no acute OT needs at this time. If acute OT needs arise, OT will evaluate. Please refer to discharge planning section for recommendations for equipment. Thank you  OT spoke directly to patient and observed using utensils/ opening containers at breakfast.Pt advised to discuss OT needs if changes arise upon d/c at follow up appointment.   Mateo FlowJones, Brynn   OTR/L Pager: [phone] Office: [phone] .

## 2015-03-15 NOTE — Evaluation (Signed)
Physical Therapy Evaluation Patient Details Name: Donald RogersRonny O Roberts MRN: [id number] DOB: 11/30/1951 Today's Date: 03/15/2015   History of Present Illness  Pt is s/p C2-C3 anterior cervical decompression/discectomy on 03/14/2015.  PMH: 3 level lumbar fusion, C6-C6 anterior cervical decompression/discectomy, CAD, diverticulitis, hyperlipidemia, tobacco abuse.  Clinical Impression  Pt is s/p C2-C3 anterior cervical decompression/discectomy, resulting in the deficits listed below (see PT Problem List).  Pt tolerated functional mobility, transfers and ambulation very well post surgery. Pt verbalized and demonstrated knowledge of precautions. Recommend return home with intermittent supervision and no PT follow up due to high level of function and knowledge of deficits/precautions.    Follow Up Recommendations Supervision - Intermittent;No PT follow up    Equipment Recommendations  None recommended by PT    Recommendations for Other Services       Precautions / Restrictions Precautions Precautions: Cervical Precaution Comments: Gave handout and reviewed precautions with patient.   Required Braces or Orthoses: Cervical Brace Cervical Brace: Soft collar;At all times Restrictions Weight Bearing Restrictions: No      Mobility  Bed Mobility Overal bed mobility: Modified Independent             General bed mobility comments: Demonstrated proper log roll technique, no v/c's needed  Transfers Overall transfer level: Independent Equipment used: None             General transfer comment: Pt demonstrated safe transfer techniques with no v/c's needed  Ambulation/Gait Ambulation/Gait assistance: Min guard Ambulation Distance (Feet): 550 Feet Assistive device: None Gait Pattern/deviations: Step-through pattern Gait velocity: Average   General Gait Details: Able to ambulate without any guarding, still required min v/c's for maintaining neck precautions with ambulation and talking    Stairs Stairs: Yes Stairs assistance: Supervision Stair Management: One rail Right Number of Stairs: 2 General stair comments: Demonstrated safe and independent stair navigation  Wheelchair Mobility    Modified Rankin (Stroke Patients Only)       Balance Overall balance assessment: No apparent balance deficits (not formally assessed)                                           Pertinent Vitals/Pain Pain Assessment: 0-10 Pain Score: 3  Pain Location: Throat Pain Descriptors / Indicators: Sore (Described pain as a"sore throat") Pain Intervention(s): Monitored during session    Home Living Family/patient expects to be discharged to:: Private residence Living Arrangements: Spouse/significant other Available Help at Discharge: Family Type of Home: House Home Access: Stairs to enter Entrance Stairs-Rails: Can reach both Secretary/administratorntrance Stairs-Number of Steps: 2 Home Layout: One level Home Equipment: None      Prior Function Level of Independence: Independent               Hand Dominance        Extremity/Trunk Assessment   Upper Extremity Assessment: Overall WFL for tasks assessed           Lower Extremity Assessment: Overall WFL for tasks assessed      Cervical / Trunk Assessment: Normal  Communication   Communication: No difficulties  Cognition Arousal/Alertness: Awake/alert Behavior During Therapy: WFL for tasks assessed/performed Overall Cognitive Status: Within Functional Limits for tasks assessed                      General Comments General comments (skin integrity, edema, etc.): Bandage was changed at the beginning  of session    Exercises        Assessment/Plan    PT Assessment Patient needs continued PT services  PT Diagnosis Acute pain   PT Problem List Decreased strength;Decreased balance;Decreased activity tolerance;Decreased mobility  PT Treatment Interventions Gait training;Stair training;Functional  mobility training;Therapeutic activities;Therapeutic exercise;Balance training   PT Goals (Current goals can be found in the Care Plan section) Acute Rehab PT Goals Patient Stated Goal: Eat breakfast and return home PT Goal Formulation: With patient/family Time For Goal Achievement: 03/29/15 Potential to Achieve Goals: Good    Frequency Min 3X/week   Barriers to discharge        Co-evaluation               End of Session Equipment Utilized During Treatment: Gait belt Activity Tolerance: Patient tolerated treatment well Patient left: in chair;with call bell/phone within reach;with family/visitor present Nurse Communication: Mobility status         Time: 6962-9528 PT Time Calculation (min) (ACUTE ONLY): 22 min   Charges:   PT Evaluation $Initial PT Evaluation Tier I: 1 Procedure     PT G Codes:        Donald Roberts Mar 29, 2015, 10:47 AM  Donald Roberts, SPT (student physical therapist) Office phone: [phone]

## 2015-03-15 NOTE — Op Note (Signed)
NAMKris Mouton:  Luger, Dshaun                 ACCOUNT NO.:  [id number]  MEDICAL RECORD NO.:  [id number]  LOCATION:  [id number]                        FACILITY:  [facility]  PHYSICIAN:  Hilda LiasErnesto Merl Bommarito, M.D.   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  PROCEDURE:  Anterior C2-C3 diskectomy, decompression of the spinal cord, bilateral foraminotomy, interbody fusion with allograft of 9-mm.  Plate. Microscope.  SURGEON:  Hilda LiasErnesto Serah Nicoletti, M.D.  ASSISTANT:  Reinaldo Meekerandy O. Kritzer, M.D.  CLINICAL HISTORY:  Ms. Yetta BarreJones in the past had surgery in the lower cervical area.  For the past 2 years, he had been complaining of pain going to the left shoulder associated with weakness, mild of the left deltoid pain, going to the base of the skull.  He had epidural injection with some mild relief.  X-ray and CT scan showed that he has a bad case of cervical spondylosis with severe stenosis at the level of 2-3 to the left.  We talked about doing surgery posteriorly, but he want to proceed anteriorly.  He knows the risks and benefits.  DESCRIPTION OF PROCEDURE:  The patient was taken to the OR, and after intubation, the left side of the neck was cleaned with DuraPrep. Incision was made in the upper part of the neck through the skin, subcutaneous tissue, platysma, and we dissected all our way around the oropharynx to get into the cervical area.  The x-ray showed that the needles were one at the level of 3-4 and the other one at the 2-3.  From then on, we opened the anterior ligament of 2-3.  With the microcurette, we did a total gross diskectomy.  We brought the microscope into the area.  We decompressed the posterior ligament with opening the foramen. In the left  side, the patient had quite a bit of adhesion between the nerve and the osteophyte with quite a bit of fibrosis.  Lysis was accomplished.  I was able to introduce a hook into the foramen.  From then on, the endplates were drilled and I inserted allograft of 9-mm, lordotic without any problem at the level of 2-3.  This was followed by a plate with 4 screws.  Lateral cervical spine x-ray showed good position of the plate.  From then on, the area was irrigated.  I did wait 10 minutes just to be sure the patient was absolutely dry.  A drain was left and the wound was closed with Vicryl and Steri-Strip.          ______________________________ Hilda LiasErnesto Shaquila Sigman, M.D.    EB/MEDQ  D:  03/14/2015  T:  03/15/2015  Job:  [id number]

## 2015-03-15 NOTE — Progress Notes (Signed)
D/C orders received. Pt educated on d/c instructions and verbalized understanding. Pt handed d/c packet. IV removed. Pt dressed and belongings packed. Pt taken downstairs by RN via wheelchair.

## 2015-03-15 NOTE — Progress Notes (Signed)
UR complete.  Larcenia Holaday RN, MSN 

## 2015-03-15 NOTE — Discharge Summary (Signed)
Physician Discharge Summary  Patient ID: Donald RogersRonny O Bartl MRN: [id number] DOB/AGE: 63/08/1951 63 y.o.  Admit date: 03/14/2015 Discharge date: 03/15/2015  Admission Diagnoses:cervical spondylosis 2-3  Discharge Diagnoses:  Active Problems:   Cervical spondylosis   Discharged Condition: no pain  Hospital Course: surgery  Consults: none  Significant Diagnostic Studies: ct   Treatments: c2-3 anterior fusion  Discharge Exam: Blood pressure 136/84, pulse 70, temperature 98 F (36.7 C), temperature source Oral, resp. rate 18, height 5\' 10"  (1.778 m), weight 74.39 kg (164 lb), SpO2 98 %. No pain or weakness  Disposition: 01-Home or Self Care     Medication List    TAKE these medications        diazepam 5 MG tablet  Commonly known as:  VALIUM  Take 5 mg by mouth as needed for anxiety.     famotidine-calcium carbonate-magnesium hydroxide 10-800-165 MG Chew chewable tablet  Commonly known as:  PEPCID COMPLETE  Chew 1 tablet by mouth daily.     methylPREDNIsolone 4 MG tablet  Commonly known as:  MEDROL DOSPACK  follow package directions     nitroGLYCERIN 0.4 MG SL tablet  Commonly known as:  NITROSTAT  Place 0.4 mg under the tongue every 5 (five) minutes as needed for chest pain.     oxyCODONE-acetaminophen 10-325 MG per tablet  Commonly known as:  PERCOCET  Take 1 tablet by mouth every 4 (four) hours as needed for pain.     oxymorphone 10 MG tablet  Commonly known as:  OPANA  Take 10 mg by mouth every 8 (eight) hours as needed for pain.     pravastatin 40 MG tablet  Commonly known as:  PRAVACHOL  Take 40 mg by mouth daily.     ramipril 5 MG capsule  Commonly known as:  ALTACE  Take 5 mg by mouth daily.         Signed: Karn CassisBOTERO,Johnnisha Forton M 03/15/2015, 2:19 PM

## 2015-03-18 ENCOUNTER — Encounter (HOSPITAL_COMMUNITY): Payer: Self-pay | Admitting: Neurosurgery

## 2015-04-20 ENCOUNTER — Other Ambulatory Visit: Payer: Self-pay | Admitting: Otolaryngology

## 2015-04-20 DIAGNOSIS — R1310 Dysphagia, unspecified: Secondary | ICD-10-CM

## 2015-05-05 ENCOUNTER — Ambulatory Visit (HOSPITAL_COMMUNITY)
Admission: RE | Admit: 2015-05-05 | Discharge: 2015-05-05 | Disposition: A | Discharge status: Home / Self Care | Payer: BLUE CROSS/BLUE SHIELD | Source: Ambulatory Visit | Attending: Otolaryngology | Admitting: Otolaryngology

## 2015-05-05 DIAGNOSIS — K228 Other specified diseases of esophagus: Secondary | ICD-10-CM | POA: Insufficient documentation

## 2015-05-05 DIAGNOSIS — R1310 Dysphagia, unspecified: Secondary | ICD-10-CM | POA: Diagnosis present

## 2015-12-11 ENCOUNTER — Encounter: Payer: Self-pay | Admitting: Cardiology

## 2016-10-28 ENCOUNTER — Telehealth: Payer: Self-pay | Admitting: Internal Medicine

## 2016-10-28 NOTE — Telephone Encounter (Signed)
OK with me.

## 2016-10-28 NOTE — Telephone Encounter (Signed)
Pt on several controlled substances.. No concern in past for doctor shopping or  narcotic misuse/abuse? Concerns? Who prescribes the Opana?

## 2016-10-28 NOTE — Telephone Encounter (Signed)
Pt called wanting to schedule new patient appointment with dr [name].  Transfer from dr Jonny Ruizjohn Is this ok to schedule  The are closer to this location

## 2016-10-29 NOTE — Telephone Encounter (Signed)
No issues that I am aware  I believe pt is seen per pain management since I do not prescribe these

## 2016-10-30 NOTE — Telephone Encounter (Signed)
Okay to transfer care. Make sure pt aware he would continue to need to see pain center as I do not prescribe opana regularly.

## 2016-10-30 NOTE — Telephone Encounter (Signed)
Appointment 12/19 Pt aware Pt also aware that you do not prescribe opana He stated he goes to pain clinic for his pain meds

## 2016-11-19 ENCOUNTER — Ambulatory Visit: Payer: BLUE CROSS/BLUE SHIELD | Admitting: Family Medicine

## 2016-12-13 ENCOUNTER — Ambulatory Visit (INDEPENDENT_AMBULATORY_CARE_PROVIDER_SITE_OTHER): Payer: BLUE CROSS/BLUE SHIELD | Admitting: Family Medicine

## 2016-12-13 ENCOUNTER — Encounter: Payer: Self-pay | Admitting: Family Medicine

## 2016-12-13 DIAGNOSIS — M549 Dorsalgia, unspecified: Secondary | ICD-10-CM | POA: Diagnosis not present

## 2016-12-13 DIAGNOSIS — I2510 Atherosclerotic heart disease of native coronary artery without angina pectoris: Secondary | ICD-10-CM

## 2016-12-13 DIAGNOSIS — E782 Mixed hyperlipidemia: Secondary | ICD-10-CM | POA: Diagnosis not present

## 2016-12-13 DIAGNOSIS — G8929 Other chronic pain: Secondary | ICD-10-CM | POA: Diagnosis not present

## 2016-12-13 NOTE — Assessment & Plan Note (Signed)
Moderate intenstiy statin per cardiology.

## 2016-12-13 NOTE — Assessment & Plan Note (Signed)
Followed by Dr. Donnie Ahoilley

## 2016-12-13 NOTE — Patient Instructions (Signed)
Quit smoking.. Call if interested in medicaiton to help screen.

## 2016-12-13 NOTE — Progress Notes (Signed)
   Subjective:    Patient ID: Donald Roberts, male    DOB: 05/24/1951, 66 y.o.   MRN: [id number]  HPI  66 year old male pt  presents  establish and transfer care.  Previous  PCP Dr. Jonny RuizJohn.  Last OV 2014, due for CPX.  He has a history of multiple neck and lumbar surgeries. He has chronic back pain.  Dr. Jeral FruitBotero is neurosurgeon. Followed by pain management  Dr. Ollen BowlHarkins. Followed there every three months.   Currently using opana or percocet for pain.    CAD: followed by Dr. Donnie Ahoilley cardiologist.   High cholesterol : on  Pravastatin 40 mg daily.   Follow chol levels.. Last checked 1 month ago.  Exercise: very active, golf  Diet: low chol diet, heart healthy for the most part.  Hypertension:   well controlled  On ramipril. BP Readings from Last 3 Encounters:  12/13/16 100/64  03/15/15 136/84  10/06/13 (!) 172/82  Using medication without problems or lightheadedness: None Chest pain with exertion:none Edema:None Short of breath:None Average home BPs: not checking Other issues:   Current smoker: 50 pack year history. Working on quitting.. Plan stop date 07/2017. Weaning off with e cig. Tried chantix in past.. Wasn't ready.  Social History /Family History/Past Medical History reviewed and updated if needed.   Review of Systems  Constitutional: Negative for fatigue and fever.  HENT: Negative for ear pain.   Eyes: Negative for pain.  Respiratory: Negative for cough and shortness of breath.   Cardiovascular: Negative for chest pain, palpitations and leg swelling.  Gastrointestinal: Negative for abdominal pain.  Genitourinary: Negative for dysuria.  Musculoskeletal: Negative for arthralgias.  Neurological: Negative for syncope, light-headedness and headaches.  Psychiatric/Behavioral: Negative for dysphoric mood.       Objective:   Physical Exam  Constitutional: Vital signs are normal. He appears well-developed and well-nourished.  HENT:  Head: Normocephalic.  Right Ear:  Hearing normal.  Left Ear: Hearing normal.  Nose: Nose normal.  Mouth/Throat: Oropharynx is clear and moist and mucous membranes are normal.  Neck: Trachea normal. Carotid bruit is not present. No thyroid mass and no thyromegaly present.  Cardiovascular: Normal rate, regular rhythm and normal pulses.  Exam reveals no gallop, no distant heart sounds and no friction rub.   No murmur heard. No peripheral edema  Pulmonary/Chest: Effort normal and breath sounds normal. No respiratory distress.  Skin: Skin is warm, dry and intact. No rash noted.  Psychiatric: He has a normal mood and affect. His speech is normal and behavior is normal. Thought content normal.          Assessment & Plan:

## 2016-12-13 NOTE — Assessment & Plan Note (Signed)
Followed by pain management with percocet or opana.. Does not take at same time. Followed q3 months.  Worse in cold weather.

## 2016-12-13 NOTE — Progress Notes (Signed)
Pre visit review using our clinic review tool, if applicable. No additional management support is needed unless otherwise documented below in the visit note. 

## 2016-12-16 DIAGNOSIS — M542 Cervicalgia: Secondary | ICD-10-CM | POA: Diagnosis not present

## 2016-12-16 DIAGNOSIS — I10 Essential (primary) hypertension: Secondary | ICD-10-CM | POA: Diagnosis not present

## 2016-12-16 DIAGNOSIS — M5416 Radiculopathy, lumbar region: Secondary | ICD-10-CM | POA: Diagnosis not present

## 2016-12-16 DIAGNOSIS — M545 Low back pain: Secondary | ICD-10-CM | POA: Diagnosis not present

## 2016-12-16 DIAGNOSIS — M5412 Radiculopathy, cervical region: Secondary | ICD-10-CM | POA: Diagnosis not present

## 2016-12-30 ENCOUNTER — Ambulatory Visit (INDEPENDENT_AMBULATORY_CARE_PROVIDER_SITE_OTHER): Payer: BLUE CROSS/BLUE SHIELD

## 2016-12-30 VITALS — BP 130/80 | HR 84 | Temp 97.9°F | Ht 69.0 in | Wt 166.5 lb

## 2016-12-30 DIAGNOSIS — Z114 Encounter for screening for human immunodeficiency virus [HIV]: Secondary | ICD-10-CM

## 2016-12-30 DIAGNOSIS — I2510 Atherosclerotic heart disease of native coronary artery without angina pectoris: Secondary | ICD-10-CM

## 2016-12-30 DIAGNOSIS — Z0000 Encounter for general adult medical examination without abnormal findings: Secondary | ICD-10-CM

## 2016-12-30 DIAGNOSIS — E784 Other hyperlipidemia: Secondary | ICD-10-CM

## 2016-12-30 DIAGNOSIS — Z1159 Encounter for screening for other viral diseases: Secondary | ICD-10-CM | POA: Diagnosis not present

## 2016-12-30 DIAGNOSIS — Z125 Encounter for screening for malignant neoplasm of prostate: Secondary | ICD-10-CM

## 2016-12-30 DIAGNOSIS — E7849 Other hyperlipidemia: Secondary | ICD-10-CM

## 2016-12-30 LAB — COMPREHENSIVE METABOLIC PANEL
ALT: 10 U/L (ref 0–53)
AST: 15 U/L (ref 0–37)
Albumin: 4.2 g/dL (ref 3.5–5.2)
Alkaline Phosphatase: 55 U/L (ref 39–117)
BUN: 11 mg/dL (ref 6–23)
CO2: 30 mEq/L (ref 19–32)
Calcium: 9.6 mg/dL (ref 8.4–10.5)
Chloride: 101 mEq/L (ref 96–112)
Creatinine, Ser: 0.89 mg/dL (ref 0.40–1.50)
GFR: 91.33 mL/min (ref >60.00)
Glucose, Bld: 94 mg/dL (ref 70–99)
Potassium: 4.3 mEq/L (ref 3.5–5.1)
Sodium: 138 mEq/L (ref 135–145)
Total Bilirubin: 0.5 mg/dL (ref 0.2–1.2)
Total Protein: 7.5 g/dL (ref 6.0–8.3)

## 2016-12-30 LAB — CBC WITH DIFFERENTIAL/PLATELET
Basophils Absolute: 0.1 10*3/uL (ref 0.0–0.1)
Basophils Relative: 0.9 % (ref 0.0–3.0)
Eosinophils Absolute: 0.1 10*3/uL (ref 0.0–0.7)
Eosinophils Relative: 1.3 % (ref 0.0–5.0)
HCT: 42.2 % (ref 39.0–52.0)
Hemoglobin: 14.3 g/dL (ref 13.0–17.0)
Lymphocytes Relative: 24.9 % (ref 12.0–46.0)
Lymphs Abs: 2.1 10*3/uL (ref 0.7–4.0)
MCHC: 33.8 g/dL (ref 30.0–36.0)
MCV: 85.7 fl (ref 78.0–100.0)
Monocytes Absolute: 0.7 10*3/uL (ref 0.1–1.0)
Monocytes Relative: 8.6 % (ref 3.0–12.0)
Neutro Abs: 5.5 10*3/uL (ref 1.4–7.7)
Neutrophils Relative %: 64.3 % (ref 43.0–77.0)
Platelets: 263 10*3/uL (ref 150.0–400.0)
RBC: 4.93 Mil/uL (ref 4.22–5.81)
RDW: 13.4 % (ref 11.5–15.5)
WBC: 8.5 10*3/uL (ref 4.0–10.5)

## 2016-12-30 LAB — LIPID PANEL
Cholesterol: 145 mg/dL (ref 0–200)
HDL: 45.2 mg/dL (ref >39.00)
LDL Cholesterol: 84 mg/dL (ref 0–99)
NonHDL: 99.69
Total CHOL/HDL Ratio: 3
Triglycerides: 77 mg/dL (ref 0.0–149.0)
VLDL: 15.4 mg/dL (ref 0.0–40.0)

## 2016-12-30 LAB — TSH: TSH: 1.03 u[IU]/mL (ref 0.35–4.50)

## 2016-12-30 LAB — PSA, MEDICARE: PSA: 0.68 ng/ml (ref 0.10–4.00)

## 2016-12-30 NOTE — Patient Instructions (Signed)
[name] , Thank you for taking time to come for your Medicare Wellness Visit. I appreciate your ongoing commitment to your health goals. Please review the following plan we discussed and let me know if I can assist you in the future.   These are the goals we discussed: Goals    . Increase physical activity          Starting 12/30/16, I will continue to walk at least 30-45 min daily.        This is a list of the screening recommended for you and due dates:  Health Maintenance  Topic Date Due  . Colon Cancer Screening  12/01/2018*  . Flu Shot  12/30/2025*  . Shingles Vaccine  12/30/2025*  . Tetanus Vaccine  12/02/2017  .  Hepatitis C: One time screening is recommended by Center for Disease Control  (CDC) for  adults born from [birth year] through 1965.   Completed  . HIV Screening  Completed  *Topic was postponed. The date shown is not the original due date.   Preventive Care for Adults  A healthy lifestyle and preventive care can promote health and wellness. Preventive health guidelines for adults include the following key practices.  . A routine yearly physical is a good way to check with your health care provider about your health and preventive screening. It is a chance to share any concerns and updates on your health and to receive a thorough exam.  . Visit your dentist for a routine exam and preventive care every 6 months. Brush your teeth twice a day and floss once a day. Good oral hygiene prevents tooth decay and gum disease.  . The frequency of eye exams is based on your age, health, family medical history, use  of contact lenses, and other factors. Follow your health care provider's ecommendations for frequency of eye exams.  . Eat a healthy diet. Foods like vegetables, fruits, whole grains, low-fat dairy products, and lean protein foods contain the nutrients you need without too many calories. Decrease your intake of foods high in solid fats, added sugars, and salt. Eat the right  amount of calories for you. Get information about a proper diet from your health care provider, if necessary.  . Regular physical exercise is one of the most important things you can do for your health. Most adults should get at least 150 minutes of moderate-intensity exercise (any activity that increases your heart rate and causes you to sweat) each week. In addition, most adults need muscle-strengthening exercises on 2 or more days a week.  Silver Sneakers may be a benefit available to you. To determine eligibility, you may visit the website: www.silversneakers.com or contact program at 716-517-95851-718-670-3174 Mon-Fri between 8AM-8PM.   . Maintain a healthy weight. The body mass index (BMI) is a screening tool to identify possible weight problems. It provides an estimate of body fat based on height and weight. Your health care provider can find your BMI and can help you achieve or maintain a healthy weight.   For adults 20 years and older: ? A BMI below 18.5 is considered underweight. ? A BMI of 18.5 to 24.9 is normal. ? A BMI of 25 to 29.9 is considered overweight. ? A BMI of 30 and above is considered obese.   . Maintain normal blood lipids and cholesterol levels by exercising and minimizing your intake of saturated fat. Eat a balanced diet with plenty of fruit and vegetables. Blood tests for lipids and cholesterol should begin at  age 17 and be repeated every 5 years. If your lipid or cholesterol levels are high, you are over 50, or you are at high risk for heart disease, you may need your cholesterol levels checked more frequently. Ongoing high lipid and cholesterol levels should be treated with medicines if diet and exercise are not working.  . If you smoke, find out from your health care provider how to quit. If you do not use tobacco, please do not start.  . If you choose to drink alcohol, please do not consume more than 2 drinks per day. One drink is considered to be 12 ounces (355 mL) of beer, 5  ounces (148 mL) of wine, or 1.5 ounces (44 mL) of liquor.  . If you are 52-13 years old, ask your health care provider if you should take aspirin to prevent strokes.  . Use sunscreen. Apply sunscreen liberally and repeatedly throughout the day. You should seek shade when your shadow is shorter than you. Protect yourself by wearing long sleeves, pants, a wide-brimmed hat, and sunglasses year round, whenever you are outdoors.  . Once a month, do a whole body skin exam, using a mirror to look at the skin on your back. Tell your health care provider of new moles, moles that have irregular borders, moles that are larger than a pencil eraser, or moles that have changed in shape or color.

## 2016-12-30 NOTE — Progress Notes (Signed)
PCP notes:   Health maintenance:  HIV screening - completed Hep C screening - completed Colonoscopy - PCP will order at [facility] Flu vaccine - declined Shingles vaccine - declined but pt stated he would get vaccine if PCP orders it  Abnormal screenings:   None  Patient concerns:   None  Nurse concerns:  None  Next PCP appt:   I reviewed health advisor's note, was available for consultation, and agree with documentation and plan. Roxy MannsMarne Tower MD   01/23/17 @ 0900

## 2016-12-30 NOTE — Progress Notes (Signed)
Pre visit review using our clinic review tool, if applicable. No additional management support is needed unless otherwise documented below in the visit note. 

## 2016-12-30 NOTE — Progress Notes (Signed)
Subjective:   Donald Roberts is a 66 y.o. male who presents for an Initial Medicare Annual Wellness Visit.  Review of Systems  N/A Cardiac Risk Factors include: advanced age (>28men, >59 women);smoking/ tobacco exposure;male gender;dyslipidemia    Objective:    Today's Vitals   12/30/16 0951  BP: 130/80  Pulse: 84  Temp: 97.9 F (36.6 C)  TempSrc: Oral  SpO2: 95%  Weight: 166 lb 8 oz (75.5 kg)  Height: 5\' 9"  (1.753 m)  PainSc: 3   PainLoc: Back   Body mass index is 24.59 kg/m.  Current Medications (verified) Outpatient Encounter Prescriptions as of 12/30/2016  Medication Sig  . aspirin EC 81 MG tablet Take 81 mg by mouth daily.   . diazepam (VALIUM) 5 MG tablet Take 5 mg by mouth as needed for anxiety.  . famotidine-calcium carbonate-magnesium hydroxide (PEPCID COMPLETE) 10-800-165 MG CHEW chewable tablet Chew 1 tablet by mouth daily.   . nitroGLYCERIN (NITROSTAT) 0.4 MG SL tablet Place 0.4 mg under the tongue every 5 (five) minutes as needed for chest pain.  Marland Kitchen oxymorphone (OPANA) 10 MG tablet Take 10 mg by mouth every 8 (eight) hours as needed for pain.  . pravastatin (PRAVACHOL) 40 MG tablet Take 40 mg by mouth daily.  . ramipril (ALTACE) 5 MG capsule Take 5 mg by mouth daily.  Marland Kitchen oxyCODONE-acetaminophen (PERCOCET) 10-325 MG per tablet Take 1 tablet by mouth every 4 (four) hours as needed for pain.   No facility-administered encounter medications on file as of 12/30/2016.     Allergies (verified) Patient has no known allergies.   History: Past Medical History:  Diagnosis Date  . Abdominal pain, generalized 07/18/2008  . ALLERGIC RHINITIS 07/18/2008  . Back pain, chronic 10/01/2011  . Carpal tunnel syndrome on left   . Chronic low back pain 12/25/2012  . COLONIC POLYPS, HX OF 07/18/2008  . CORONARY ARTERY DISEASE 07/18/2008  . Degenerative arthritis of right knee 10/01/2011  . DIVERTICULOSIS, COLON 07/18/2008  . Gross hematuria 07/18/2008  . Headache(784.0)    Hx:  of Migraines as a child  . Heart murmur   . HYPERLIPIDEMIA 07/18/2008  . Hypertension   . TOBACCO ABUSE 04/06/2009   Past Surgical History:  Procedure Laterality Date  . ANTERIOR CERVICAL DECOMP/DISCECTOMY FUSION N/A 09/10/2013   Procedure: ANTERIOR CERVICAL DECOMPRESSION/DISCECTOMY FUSION CERVICAL FOUR-FIVE,CERVICAL FIVE-SIX WITH PLATING AND BONE GRAFT;  Surgeon: Karn Cassis, MD;  Location: [facility];  Service: Neurosurgery;  Laterality: N/A;  . ANTERIOR CERVICAL DECOMP/DISCECTOMY FUSION N/A 03/14/2015   Procedure: ANTERIOR CERVICAL DECOMPRESSION/DISCECTOMY FUSION  CERVICAL TWO-THREE;  Surgeon: Hilda Lias, MD;  Location: [facility];  Service: Neurosurgery;  Laterality: N/A;  . CARDIAC CATHETERIZATION    . COLONOSCOPY W/ BIOPSIES AND POLYPECTOMY     Hx: of  . lumbar surgury     dr botero/ns; 3 level fusion per pt  . s/p cervical disease x 2     Family History  Problem Relation Age of Onset  . Heart disease Brother   . Heart disease Mother   . Heart disease Other    Social History   Occupational History  . Surveyor, minerals and Midwife    Social History Main Topics  . Smoking status: Former Smoker    Packs/day: 1.00    Types: Cigarettes    Quit date: 12/23/2016  . Smokeless tobacco: Never Used  . Alcohol use Yes     Comment: occasional  . Drug use: No  . Sexual activity: Not  Currently   Tobacco Counseling Counseling given: No   Activities of Daily Living In your present state of health, do you have any difficulty performing the following activities: 12/30/2016  Hearing? N  Vision? N  Difficulty concentrating or making decisions? N  Walking or climbing stairs? N  Dressing or bathing? N  Doing errands, shopping? N  Preparing Food and eating ? N  Using the Toilet? N  In the past six months, have you accidently leaked urine? N  Do you have problems with loss of bowel control? N  Managing your Medications? N  Managing your Finances? N  Housekeeping or  managing your Housekeeping? N  Some recent data might be hidden    Immunizations and Health Maintenance  There is no immunization history on file for this patient. There are no preventive care reminders to display for this patient.  Patient Care Team: Excell SeltzerAmy E Bedsole, MD as PCP - General (Family Medicine)    Assessment:   This is a routine wellness examination for Donald Roberts.   Hearing/Vision screen  Visual Acuity Screening   Right eye Left eye Both eyes  Without correction: 20/30-1 20/30 20/30-1  With correction:     Hearing Screening Comments: Wears bilateral hearing aids  Dietary issues and exercise activities discussed: Current Exercise Habits: Home exercise routine, Type of exercise: walking, Time (Minutes): 45, Frequency (Times/Week): 7, Weekly Exercise (Minutes/Week): 315, Intensity: Moderate, Exercise limited by: None identified  Goals    . Increase physical activity          Starting 12/30/16, I will continue to walk at least 30-45 min daily.       Depression Screen PHQ 2/9 Scores 12/30/2016  PHQ - 2 Score 0    Fall Risk Fall Risk  12/30/2016  Falls in the past year? No    Cognitive Function: MMSE - Mini Mental State Exam 12/30/2016  Orientation to time 5  Orientation to Place 5  Registration 3  Attention/ Calculation 0  Recall 3  Language- name 2 objects 0  Language- repeat 1  Language- follow 3 step command 3  Language- read & follow direction 0  Write a sentence 0  Copy design 0  Total score 20     PLEASE NOTE: A Mini-Cog screen was completed. Maximum score is 20. A value of 0 denotes this part of Folstein MMSE was not completed or the patient failed this part of the Mini-Cog screening.   Mini-Cog Screening Orientation to Time - Max 5 pts Orientation to Place - Max 5 pts Registration - Max 3 pts Recall - Max 3 pts Language Repeat - Max 1 pts Language Follow 3 Step Command - Max 3 pts     Screening Tests Health Maintenance  Topic Date Due  .  COLONOSCOPY  12/01/2018 (Originally 03/05/2016)  . INFLUENZA VACCINE  12/30/2025 (Originally 07/02/2016)  . ZOSTAVAX  12/30/2025 (Originally 07/10/2012)  . TETANUS/TDAP  12/02/2017  . Hepatitis C Screening  Completed  . HIV Screening  Completed        Plan:     I have personally reviewed and addressed the Medicare Annual Wellness questionnaire and have noted the following in the patient's chart:  A. Medical and social history B. Use of alcohol, tobacco or illicit drugs  C. Current medications and supplements D. Functional ability and status E.  Nutritional status F.  Physical activity G. Advance directives H. List of other physicians I.  Hospitalizations, surgeries, and ER visits in previous 12 months J.  Vitals  K. Screenings to include hearing, vision, cognitive, depression L. Referrals and appointments - none  In addition, I have reviewed and discussed with patient certain preventive protocols, quality metrics, and best practice recommendations. A written personalized care plan for preventive services as well as general preventive health recommendations were provided to patient.  See attached scanned questionnaire for additional information.   Signed,   Randa Evens, MHA, BS, LPN Health Coach

## 2016-12-31 LAB — HIV ANTIBODY (ROUTINE TESTING W REFLEX): HIV 1&2 Ab, 4th Generation: NONREACTIVE

## 2016-12-31 LAB — HEPATITIS C ANTIBODY: HCV Ab: NEGATIVE

## 2017-01-23 ENCOUNTER — Ambulatory Visit (INDEPENDENT_AMBULATORY_CARE_PROVIDER_SITE_OTHER): Payer: BLUE CROSS/BLUE SHIELD | Admitting: Family Medicine

## 2017-01-23 ENCOUNTER — Encounter: Payer: Self-pay | Admitting: Family Medicine

## 2017-01-23 VITALS — BP 98/60 | HR 60 | Temp 97.7°F | Ht 69.0 in | Wt 165.5 lb

## 2017-01-23 DIAGNOSIS — Z87891 Personal history of nicotine dependence: Secondary | ICD-10-CM | POA: Diagnosis not present

## 2017-01-23 DIAGNOSIS — M549 Dorsalgia, unspecified: Secondary | ICD-10-CM | POA: Diagnosis not present

## 2017-01-23 DIAGNOSIS — G8929 Other chronic pain: Secondary | ICD-10-CM

## 2017-01-23 DIAGNOSIS — E782 Mixed hyperlipidemia: Secondary | ICD-10-CM | POA: Diagnosis not present

## 2017-01-23 DIAGNOSIS — I2510 Atherosclerotic heart disease of native coronary artery without angina pectoris: Secondary | ICD-10-CM

## 2017-01-23 DIAGNOSIS — Z86010 Personal history of colonic polyps: Secondary | ICD-10-CM

## 2017-01-23 NOTE — Assessment & Plan Note (Signed)
Asymptomatic. Followed by Cards. ON ASA ans ACEI.

## 2017-01-23 NOTE — Progress Notes (Signed)
Pre visit review using our clinic review tool, if applicable. No additional management support is needed unless otherwise documented below in the visit note. 

## 2017-01-23 NOTE — Progress Notes (Signed)
Subjective:    Patient ID: Donald Roberts, male    DOB: 10/09/1951, 66 y.o.   MRN: [id number]  HPI  66 year old male presents for PART AMW.  The patient saw Lu Duffelarlesia Pinson, LPN for medicare wellness on 12/30/2016 Note reviewed in detail and important notes copied below.  Health maintenance:  HIV screening - completed Hep C screening - completed Colonoscopy - PCP will order at [facility] Flu vaccine - declined Shingles vaccine - declined but pt stated he would get vaccine if PCP orders it  Abnormal screenings:  None  Patient concerns:  None  Nurse concerns: None  Elevated Cholesterol:  Excellent control on pravastatin 40 mg daily  Lab Results  Component Value Date   CHOL 145 12/30/2016   HDL 45.20 12/30/2016   LDLCALC 84 12/30/2016   TRIG 77.0 12/30/2016   CHOLHDL 3 12/30/2016  Using medications without problems: none Muscle aches:  None Diet compliance: Moderate Exercise: walks daily Other complaints:   HX of CAD, cardiologist  Dr. Donnie Ahoilley  On ASA, ACEI   BP Readings from Last 3 Encounters:  01/23/17 98/60  12/30/16 130/80  12/13/16 100/64    Chronic neck pain, back pain: Followed pain MD Dr. Ollen BowlHarkins  He is on opana.. Is working on decreasing dose.  Rarely using valium for insomnia and muscle spasm.. Using about 3 times a month.  Social History /Family History/Past Medical History reviewed and updated if needed.  Review of Systems  Constitutional: Negative for fatigue and fever.  HENT: Negative for ear pain.   Eyes: Negative for pain.  Respiratory: Negative for cough and shortness of breath.   Cardiovascular: Negative for chest pain, palpitations and leg swelling.  Gastrointestinal: Negative for abdominal pain.  Genitourinary: Negative for dysuria.  Musculoskeletal: Negative for arthralgias.  Neurological: Negative for syncope, light-headedness and headaches.  Psychiatric/Behavioral: Negative for dysphoric mood.   Blood pressure 98/60, pulse 60,  temperature 97.7 F (36.5 C), temperature source Oral, height 5\' 9"  (1.753 m), weight 165 lb 8 oz (75.1 kg).     Objective:   Physical Exam  Constitutional: He appears well-developed and well-nourished.  Non-toxic appearance. He does not appear ill. No distress.  HENT:  Head: Normocephalic and atraumatic.  Right Ear: Hearing, tympanic membrane, external ear and ear canal normal.  Left Ear: Hearing, tympanic membrane, external ear and ear canal normal.  Nose: Nose normal.  Mouth/Throat: Uvula is midline, oropharynx is clear and moist and mucous membranes are normal.  Eyes: Conjunctivae, EOM and lids are normal. Pupils are equal, round, and reactive to light. Lids are everted and swept, no foreign bodies found.  Neck: Trachea normal, normal range of motion and phonation normal. Neck supple. Carotid bruit is not present. No thyroid mass and no thyromegaly present.  Cardiovascular: Normal rate, regular rhythm, S1 normal, S2 normal, intact distal pulses and normal pulses.  Exam reveals no gallop.   No murmur heard. Pulmonary/Chest: Breath sounds normal. He has no wheezes. He has no rhonchi. He has no rales.  Abdominal: Soft. Normal appearance and bowel sounds are normal. There is no hepatosplenomegaly. There is no tenderness. There is no rebound, no guarding and no CVA tenderness. No hernia.  Lymphadenopathy:    He has no cervical adenopathy.  Neurological: He is alert. He has normal strength and normal reflexes. No cranial nerve deficit or sensory deficit. Gait normal.  Skin: Skin is warm, dry and intact. No rash noted.  Psychiatric: He has a normal mood and affect. His speech  is normal and behavior is normal. Judgment normal.          Assessment & Plan:  The patient's preventative maintenance and recommended screening tests for an annual wellness exam were reviewed in full today. Brought up to date unless services declined.  Counselled on the importance of diet, exercise, and its role  in overall health and mortality. The patient's FH and SH was reviewed, including their home life, tobacco status, and drug and alcohol status.    Vaccines: refused flu and shingles vaccine Prostate Cancer Screen: Lab Results  Component Value Date   PSA 0.68 12/30/2016   PSA 0.71 12/25/2012   PSA 0.32 09/30/2011  Colon Cancer Screen:  due      Smoking Status: former smoker, he is using the Ecig with 0 nicotine,  44 year history, quit 1/1/ 2018, candidate for lung cancer screening program. ETOH/ drug use: none/none  Hep C:  done  HIV screen:   done

## 2017-01-23 NOTE — Assessment & Plan Note (Signed)
Almost at goal LDL < 70 on moderate dose statin. Pt will discuss with cardiologist possibly increasing dose or changing to stronger statin.

## 2017-01-23 NOTE — Patient Instructions (Addendum)
Consider shingles vaccine.. Look into cost.  Please stop at the front desk or lab to set up referral or to have labs drawn.

## 2017-01-23 NOTE — Assessment & Plan Note (Addendum)
Followed by [facility]. Trying to reduce opana  Dose and valium use.

## 2017-01-28 ENCOUNTER — Telehealth: Payer: Self-pay | Admitting: *Deleted

## 2017-01-28 DIAGNOSIS — Z87891 Personal history of nicotine dependence: Secondary | ICD-10-CM

## 2017-01-28 NOTE — Telephone Encounter (Signed)
Received referral for initial lung cancer screening scan. Contacted patient and obtained smoking history,(former, quit 1 month ago, 40 pack year) as well as answering questions related to screening process. Patient denies signs of lung cancer such as weight loss or hemoptysis. Patient denies comorbidity that would prevent curative treatment if lung cancer were found. Patient is tentatively scheduled for shared decision making visit and CT scan on 02/03/17, pending insurance approval from business office.

## 2017-02-03 ENCOUNTER — Ambulatory Visit
Admission: RE | Admit: 2017-02-03 | Discharge: 2017-02-03 | Disposition: A | Discharge status: Home / Self Care | Payer: BLUE CROSS/BLUE SHIELD | Source: Ambulatory Visit | Attending: Oncology | Admitting: Oncology

## 2017-02-03 ENCOUNTER — Encounter: Payer: Self-pay | Admitting: Oncology

## 2017-02-03 ENCOUNTER — Inpatient Hospital Stay: Payer: Medicare Other | Attending: Oncology | Admitting: Oncology

## 2017-02-03 DIAGNOSIS — Z87891 Personal history of nicotine dependence: Secondary | ICD-10-CM

## 2017-02-03 DIAGNOSIS — I700 Atherosclerosis of aorta: Secondary | ICD-10-CM | POA: Diagnosis not present

## 2017-02-03 DIAGNOSIS — I77810 Thoracic aortic ectasia: Secondary | ICD-10-CM | POA: Insufficient documentation

## 2017-02-03 DIAGNOSIS — Z122 Encounter for screening for malignant neoplasm of respiratory organs: Secondary | ICD-10-CM

## 2017-02-03 DIAGNOSIS — I2510 Atherosclerotic heart disease of native coronary artery without angina pectoris: Secondary | ICD-10-CM | POA: Diagnosis not present

## 2017-02-03 DIAGNOSIS — R918 Other nonspecific abnormal finding of lung field: Secondary | ICD-10-CM | POA: Insufficient documentation

## 2017-02-03 NOTE — Progress Notes (Signed)
Personal history of tobacco use, presenting hazards to health In accordance with CMS guidelines, patient has met eligibility criteria including age, absence of signs or symptoms of lung cancer.  Social History  Substance Use Topics  . Smoking status: Former Smoker    Packs/day: 1.00    Years: 40.00    Types: Cigarettes    Quit date: 12/23/2016  . Smokeless tobacco: Never Used  . Alcohol use Yes     Comment: occasional     A shared decision-making session was conducted prior to the performance of CT scan. This includes one or more decision aids, includes benefits and harms of screening, follow-up diagnostic testing, over-diagnosis, false positive rate, and total radiation exposure.  Counseling on the importance of adherence to annual lung cancer LDCT screening, impact of co-morbidities, and ability or willingness to undergo diagnosis and treatment is imperative for compliance of the program.  Counseling on the importance of continued smoking cessation for former smokers; the importance of smoking cessation for current smokers, and information about tobacco cessation interventions have been given to patient including [place] and 1800 quit [place] programs.  Written order for lung cancer screening with LDCT has been given to the patient and any and all questions have been answered to the best of my abilities.   Yearly follow up will be coordinated by Burgess Estelle, Thoracic Navigator.   Lucendia Herrlich, NP  02/03/17 3:12 PM

## 2017-02-03 NOTE — Assessment & Plan Note (Signed)
In accordance with CMS guidelines, patient has met eligibility criteria including age, absence of signs or symptoms of lung cancer.  Social History  Substance Use Topics  . Smoking status: Former Smoker    Packs/day: 1.00    Years: 40.00    Types: Cigarettes    Quit date: 12/23/2016  . Smokeless tobacco: Never Used  . Alcohol use Yes     Comment: occasional     A shared decision-making session was conducted prior to the performance of CT scan. This includes one or more decision aids, includes benefits and harms of screening, follow-up diagnostic testing, over-diagnosis, false positive rate, and total radiation exposure.  Counseling on the importance of adherence to annual lung cancer LDCT screening, impact of co-morbidities, and ability or willingness to undergo diagnosis and treatment is imperative for compliance of the program.  Counseling on the importance of continued smoking cessation for former smokers; the importance of smoking cessation for current smokers, and information about tobacco cessation interventions have been given to patient including [place] and 1800 quit [place] programs.  Written order for lung cancer screening with LDCT has been given to the patient and any and all questions have been answered to the best of my abilities.   Yearly follow up will be coordinated by Burgess Estelle, Thoracic Navigator.

## 2017-02-06 ENCOUNTER — Encounter: Payer: Self-pay | Admitting: Internal Medicine

## 2017-02-06 ENCOUNTER — Telehealth: Payer: Self-pay | Admitting: *Deleted

## 2017-02-06 NOTE — Telephone Encounter (Signed)
Notified patient of LDCT lung cancer screening results with recommendation for 12 month follow up imaging. Also notified of incidental finding noted below and is encouraged to discuss with PCP who will receive a copy of this note. Patient verbalizes understanding.   IMPRESSION: Lung-RADS Category 2, benign appearance or behavior. Continue annual screening with low-dose chest CT without contrast in 12 months.  Three vessel coronary atherosclerosis.  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 Mild atherosclerotic calcifications aortic arch.

## 2017-03-13 DIAGNOSIS — M545 Low back pain: Secondary | ICD-10-CM | POA: Diagnosis not present

## 2017-03-13 DIAGNOSIS — M5416 Radiculopathy, lumbar region: Secondary | ICD-10-CM | POA: Diagnosis not present

## 2017-03-13 DIAGNOSIS — M5412 Radiculopathy, cervical region: Secondary | ICD-10-CM | POA: Diagnosis not present

## 2017-03-13 DIAGNOSIS — M542 Cervicalgia: Secondary | ICD-10-CM | POA: Diagnosis not present

## 2017-04-03 ENCOUNTER — Ambulatory Visit (AMBULATORY_SURGERY_CENTER): Payer: Self-pay | Admitting: *Deleted

## 2017-04-03 VITALS — Ht 70.0 in | Wt 168.0 lb

## 2017-04-03 DIAGNOSIS — Z86010 Personal history of colonic polyps: Secondary | ICD-10-CM

## 2017-04-03 MED ORDER — NA SULFATE-K SULFATE-MG SULF 17.5-3.13-1.6 GM/177ML PO SOLN: 1.0000 | Freq: Once | ORAL | 0 refills | Status: AC

## 2017-04-03 NOTE — Progress Notes (Signed)
No egg or soy allergy known to patient  No issues with past sedation with any surgeries  or procedures, no intubation problems  No diet pills per patient No home 02 use per patient  No blood thinners per patient  Pt denies issues with constipation  No A fib or A flutter  EMMI video sent to pt's e mail -[email or web address]

## 2017-04-04 ENCOUNTER — Encounter: Payer: Self-pay | Admitting: Internal Medicine

## 2017-04-17 ENCOUNTER — Ambulatory Visit (AMBULATORY_SURGERY_CENTER): Payer: BLUE CROSS/BLUE SHIELD | Admitting: Internal Medicine

## 2017-04-17 ENCOUNTER — Encounter: Payer: Self-pay | Admitting: Internal Medicine

## 2017-04-17 VITALS — BP 128/79 | HR 49 | Temp 97.5°F | Resp 14 | Ht 70.0 in | Wt 168.0 lb

## 2017-04-17 DIAGNOSIS — I2510 Atherosclerotic heart disease of native coronary artery without angina pectoris: Secondary | ICD-10-CM | POA: Diagnosis not present

## 2017-04-17 DIAGNOSIS — I10 Essential (primary) hypertension: Secondary | ICD-10-CM | POA: Diagnosis not present

## 2017-04-17 DIAGNOSIS — Z86010 Personal history of colonic polyps: Secondary | ICD-10-CM | POA: Diagnosis not present

## 2017-04-17 MED ORDER — SODIUM CHLORIDE 0.9 % IV SOLN: 500.0000 mL | INTRAVENOUS | Status: DC

## 2017-04-17 NOTE — Progress Notes (Signed)
TO PACU pt awake and alert. Report to RN 

## 2017-04-17 NOTE — Op Note (Signed)
[facility] Patient Name: Donald Roberts Procedure Date: 04/17/2017 1:18 PM MRN: [id number] Endoscopist: Wilhemina BonitoJohn N. Marina GoodellPerry , MD Age: [age] Referring MD:  Date of Birth: 03/10/1951 Gender: Male Account #: [id number] Procedure:                Colonoscopy Indications:              High risk colon cancer surveillance: Personal                            history of multiple (3 or more) adenomas; index                            exam 2007 with small tubular adenomas Medicines:                Monitored Anesthesia Care Procedure:                Pre-Anesthesia Assessment:                           - Prior to the procedure, a History and Physical                            was performed, and patient medications and                            allergies were reviewed. The patient's tolerance of                            previous anesthesia was also reviewed. The risks                            and benefits of the procedure and the sedation                            options and risks were discussed with the patient.                            All questions were answered, and informed consent                            was obtained. Prior Anticoagulants: The patient has                            taken no previous anticoagulant or antiplatelet                            agents. ASA Grade Assessment: II - A patient with                            mild systemic disease. After reviewing the risks                            and benefits, the patient was deemed in  satisfactory condition to undergo the procedure.                           After obtaining informed consent, the colonoscope                            was passed under direct vision. Throughout the                            procedure, the patient's blood pressure, pulse, and                            oxygen saturations were monitored continuously. The                            Colonoscope was introduced through  the anus and                            advanced to the the cecum, identified by                            appendiceal orifice and ileocecal valve. The                            ileocecal valve, appendiceal orifice, and rectum                            were photographed. The quality of the bowel                            preparation was good. The colonoscopy was performed                            without difficulty. The patient tolerated the                            procedure well. The bowel preparation used was                            SUPREP. Scope In: 1:23:24 PM Scope Out: 1:41:07 PM Scope Withdrawal Time: 0 hours 13 minutes 2 seconds  Total Procedure Duration: 0 hours 17 minutes 43 seconds  Findings:                 Multiple diverticula were found in the sigmoid                            colon.                           The exam was otherwise without abnormality on                            direct and retroflexion views. Complications:            No immediate complications. Estimated blood loss:  None. Estimated Blood Loss:     Estimated blood loss: none. Impression:               - Diverticulosis in the sigmoid colon.                           - The examination was otherwise normal on direct                            and retroflexion views.                           - No specimens collected. Recommendation:           - Repeat colonoscopy in 5 years for surveillance                            (personal history of multiple adenomas).                           - Patient has a contact number available for                            emergencies. The signs and symptoms of potential                            delayed complications were discussed with the                            patient. Return to normal activities tomorrow.                            Written discharge instructions were provided to the                            patient.                            - Resume previous diet.                           - Continue present medications. Wilhemina Bonito. Marina Goodell, MD 04/17/2017 1:49:18 PM This report has been signed electronically.

## 2017-04-17 NOTE — Patient Instructions (Signed)
Handout given on divertiulosis  YOU HAD AN ENDOSCOPIC PROCEDURE TODAY: Refer to the procedure report and other information in the discharge instructions given to you for any specific questions about what was found during the examination. If this information does not answer your questions, please call  office at [phone] to clarify.   YOU SHOULD EXPECT: Some feelings of bloating in the abdomen. Passage of more gas than usual. Walking can help get rid of the air that was put into your GI tract during the procedure and reduce the bloating. If you had a lower endoscopy (such as a colonoscopy or flexible sigmoidoscopy) you may notice spotting of blood in your stool or on the toilet paper. Some abdominal soreness may be present for a day or two, also.  DIET: Your first meal following the procedure should be a light meal and then it is ok to progress to your normal diet. A half-sandwich or bowl of soup is an example of a good first meal. Heavy or fried foods are harder to digest and may make you feel nauseous or bloated. Drink plenty of fluids but you should avoid alcoholic beverages for 24 hours. If you had a esophageal dilation, please see attached instructions for diet.    ACTIVITY: Your care partner should take you home directly after the procedure. You should plan to take it easy, moving slowly for the rest of the day. You can resume normal activity the day after the procedure however YOU SHOULD NOT DRIVE, use power tools, machinery or perform tasks that involve climbing or major physical exertion for 24 hours (because of the sedation medicines used during the test).   SYMPTOMS TO REPORT IMMEDIATELY: A gastroenterologist can be reached at any hour. Please call [phone]  for any of the following symptoms:  Following lower endoscopy (colonoscopy, flexible sigmoidoscopy) Excessive amounts of blood in the stool  Significant tenderness, worsening of abdominal pains  Swelling of the abdomen  that is new, acute  Fever of 100 or higher    FOLLOW UP:  If any biopsies were taken you will be contacted by phone or by letter within the next 1-3 weeks. Call [phone]  if you have not heard about the biopsies in 3 weeks.  Please also call with any specific questions about appointments or follow up tests.

## 2017-04-18 ENCOUNTER — Telehealth: Payer: Self-pay | Admitting: *Deleted

## 2017-04-18 NOTE — Telephone Encounter (Signed)
  Follow up Call-  Call back number 04/17/2017  Post procedure Call Back phone  # [phone]  Permission to leave phone message Yes  Some recent data might be hidden     Patient questions:  Do you have a fever, pain , or abdominal swelling? No. Pain Score  0 *  Have you tolerated food without any problems? Yes.    Have you been able to return to your normal activities? Yes.    Do you have any questions about your discharge instructions: Diet   No. Medications  No. Follow up visit  No.  Do you have questions or concerns about your Care? No.  Actions: * If pain score is 4 or above: No action needed, pain <4.

## 2017-04-21 ENCOUNTER — Telehealth: Payer: Self-pay | Admitting: *Deleted

## 2017-04-21 NOTE — Telephone Encounter (Signed)
Spoke to pt's wife who states pt is biting the inside of his jaw at night to the point that it is becoming raw. She is wanting to know if this is something he needs to schedule an OV with Dr [name] for if he is needing to see a dentist. pls advise

## 2017-04-21 NOTE — Telephone Encounter (Signed)
See dentist

## 2017-04-21 NOTE — Telephone Encounter (Signed)
Left message for [name] that Dr. [name] recommends [name] make an appointment with his dentist.

## 2017-05-22 ENCOUNTER — Ambulatory Visit (INDEPENDENT_AMBULATORY_CARE_PROVIDER_SITE_OTHER): Payer: BLUE CROSS/BLUE SHIELD | Admitting: Family Medicine

## 2017-05-22 ENCOUNTER — Encounter: Payer: Self-pay | Admitting: *Deleted

## 2017-05-22 ENCOUNTER — Encounter: Payer: Self-pay | Admitting: Family Medicine

## 2017-05-22 VITALS — BP 110/64 | HR 72 | Temp 97.9°F | Ht 69.0 in | Wt 166.5 lb

## 2017-05-22 DIAGNOSIS — M7022 Olecranon bursitis, left elbow: Secondary | ICD-10-CM

## 2017-05-22 DIAGNOSIS — I2510 Atherosclerotic heart disease of native coronary artery without angina pectoris: Secondary | ICD-10-CM

## 2017-05-22 MED ORDER — DOXYCYCLINE HYCLATE 100 MG PO TABS: 100.0000 mg | ORAL_TABLET | Freq: Two times a day (BID) | ORAL | 0 refills | Status: AC

## 2017-05-22 MED ORDER — METHYLPREDNISOLONE ACETATE 40 MG/ML IJ SUSP
20.0000 mg | Freq: Once | INTRAMUSCULAR | Status: AC
  Administered 2017-05-22: 20 mg via INTRA_ARTICULAR

## 2017-05-22 NOTE — Progress Notes (Signed)
Dr. Karleen Hampshire T. Kehinde Bowdish, MD, CAQ Sports Medicine Primary Care and Sports Medicine [address] [place], [address] Phone: [phone] Fax: [phone]  05/22/2017  Patient: Donald Roberts, MRN: [id number], DOB: [birth year], 66 y.o.  Primary Physician:  Excell Seltzer, MD   Chief Complaint  Patient presents with  . Swollen Left elbow   Subjective:   Donald Roberts is a 66 y.o. very pleasant male patient who presents with the following:  Very pleasant gentleman with a history of some chronic back problems and chronic pain as well as coronary disease who presents with left-sided olecranon bursitis is been ongoing for approximately for 5 days. He doesn't have any known incident, trauma or other event. It is not red or hot. He doesn't have a history is gout.  L elbow. Asp.  Past Medical History, Surgical History, Social History, Family History, Problem List, Medications, and Allergies have been reviewed and updated if relevant.  Patient Active Problem List   Diagnosis Date Noted  . Personal history of tobacco use, presenting hazards to health 02/03/2017  . Cervical spondylosis 03/14/2015  . Back pain, chronic   . Degenerative arthritis of right knee   . Hyperlipidemia   . CAD (coronary artery disease)   . Personal history of colonic polyps     Past Medical History:  Diagnosis Date  . Abdominal pain, generalized 07/18/2008  . ALLERGIC RHINITIS 07/18/2008  . Allergy   . Back pain, chronic 10/01/2011  . Carpal tunnel syndrome on left   . Chronic low back pain 12/25/2012  . COLONIC POLYPS, HX OF 07/18/2008  . CORONARY ARTERY DISEASE 07/18/2008  . Degenerative arthritis of right knee 10/01/2011  . DIVERTICULOSIS, COLON 07/18/2008  . Gross hematuria 07/18/2008  . Headache(784.0)    Hx: of Migraines as a child  . Heart murmur   . HYPERLIPIDEMIA 07/18/2008  . Hypertension   . TOBACCO ABUSE 04/06/2009    Past Surgical History:  Procedure Laterality Date  . ANTERIOR CERVICAL  DECOMP/DISCECTOMY FUSION N/A 09/10/2013   Procedure: ANTERIOR CERVICAL DECOMPRESSION/DISCECTOMY FUSION CERVICAL FOUR-FIVE,CERVICAL FIVE-SIX WITH PLATING AND BONE GRAFT;  Surgeon: Karn Cassis, MD;  Location: [facility];  Service: Neurosurgery;  Laterality: N/A;  . ANTERIOR CERVICAL DECOMP/DISCECTOMY FUSION N/A 03/14/2015   Procedure: ANTERIOR CERVICAL DECOMPRESSION/DISCECTOMY FUSION  CERVICAL TWO-THREE;  Surgeon: Hilda Lias, MD;  Location: [facility];  Service: Neurosurgery;  Laterality: N/A;  . CARDIAC CATHETERIZATION    . COLONOSCOPY    . COLONOSCOPY W/ BIOPSIES AND POLYPECTOMY     Hx: of  . lumbar surgury     dr botero/ns; 3 level fusion per pt  . POLYPECTOMY    . s/p cervical disease x 2      Social History   Social History  . Marital status: Married    Spouse name: N/A  . Number of children: N/A  . Years of education: N/A   Occupational History  . Surveyor, minerals and Midwife    Social History Main Topics  . Smoking status: Former Smoker    Packs/day: 1.00    Years: 40.00    Types: E-cigarettes    Quit date: 12/23/2016  . Smokeless tobacco: Never Used     Comment: currently uses e cigarettes   . Alcohol use Yes     Comment: occasional  . Drug use: No  . Sexual activity: Not Currently   Other Topics Concern  . Not on file   Social History Narrative   Married,  1 daughter lives close by.   Spends time at [employer] [place].    Family History  Problem Relation Age of Onset  . Heart disease Brother   . Heart disease Mother   . Heart disease Other   . Colon cancer Neg Hx   . Colon polyps Neg Hx   . Esophageal cancer Neg Hx   . Rectal cancer Neg Hx   . Stomach cancer Neg Hx     No Known Allergies  Medication list reviewed and updated in full in [facility].  GEN: No fevers, chills. Nontoxic. Primarily MSK c/o today. MSK: Detailed in the HPI GI: tolerating PO intake without difficulty Neuro: No numbness, parasthesias, or tingling  associated. Otherwise the pertinent positives of the ROS are noted above.   Objective:   BP 110/64   Pulse 72   Temp 97.9 F (36.6 C) (Oral)   Ht 5\' 9"  (1.753 m)   Wt 166 lb 8 oz (75.5 kg)   BMI 24.59 kg/m    GEN: WDWN, NAD, Non-toxic, Alert & Oriented x 3 HEENT: Atraumatic, Normocephalic.  Ears and Nose: No external deformity. EXTR: No clubbing/cyanosis/edema NEURO: Normal gait.  PSYCH: Normally interactive. Conversant. Not depressed or anxious appearing.  Calm demeanor.   L elbow Ecchymosis or edema: neg ROM: full flexion, extension, pronation, supination Shoulder ROM: Full Flexion: 5/5 Extension: 5/5 Supination: 5/5  Pronation: 5/5 Wrist ext: 5/5 Wrist flexion: 5/5 No gross bony abnormality The olecranon bursa is boggy and nontender in appearance. There is no redness or warmth. Varus and Valgus stress: stable ECRB tenderness: neg Medial epicondyle: NT Lateral epicondyle, resisted wrist extension from wrist full pronation and flexion: NT grip: 5/5  sensation intact Tinel's, Elbow: negative   Radiology: No results found.  Assessment and Plan:   Olecranon bursitis, left elbow - Plan: methylPREDNISolone acetate (DEPO-MEDROL) injection 20 mg  Consideration of aspiration is reasonable. Discussed with the patient that aspiration and injection has risk of infection. There is also a significant failure rate. Bursectomy is the definitive surgical treatment if the patient does poorly long term.  I'm going to use prophylactic antibiotics to decrease risk of infection.  ACE bandage for compression over the next week.  Olecranon Bursa Aspiration, L Verbal consent was obtained. Risks, benefits, and alternatives discussed. Discussed with the patient that risk of infection is higher with aspiration and injection of corticosteroid into this superficial bursa. Prepped with Chloraprep and Ethyl Chloride used for anesthesia. Under sterile conditions, the effected olecranon  bursa was aspirated with an 18 gauge needle. 8 cc amount of serosanguinous fluid was aspirated. 20 mg of depo-medrol was injected.   A compression bandage was applied and the patient was instructed to continue for 72 hours.   Follow-up: prn only  Meds ordered this encounter  Medications  . methylPREDNISolone acetate (DEPO-MEDROL) injection 20 mg  . doxycycline (VIBRA-TABS) 100 MG tablet    Sig: Take 1 tablet (100 mg total) by mouth 2 (two) times daily.    Dispense:  14 tablet    Refill:  0   Signed,  Jomo Forand T. Zendaya Groseclose, MD   Allergies as of 05/22/2017   No Known Allergies     Medication List       Accurate as of 05/22/17  4:23 PM. Always use your most recent med list.          aspirin EC 81 MG tablet Take 81 mg by mouth daily.   diazepam 5 MG tablet Commonly known as:  VALIUM Take 5 mg by mouth as needed for anxiety.   doxycycline 100 MG tablet Commonly known as:  VIBRA-TABS Take 1 tablet (100 mg total) by mouth 2 (two) times daily.   famotidine-calcium carbonate-magnesium hydroxide 10-800-165 MG chewable tablet Commonly known as:  PEPCID COMPLETE Chew 1 tablet by mouth daily.   nitroGLYCERIN 0.4 MG SL tablet Commonly known as:  NITROSTAT Place 0.4 mg under the tongue every 5 (five) minutes as needed for chest pain.   oxymorphone 10 MG tablet Commonly known as:  OPANA Take 10 mg by mouth every 8 (eight) hours as needed for pain.   pravastatin 40 MG tablet Commonly known as:  PRAVACHOL Take 40 mg by mouth daily.   ramipril 5 MG capsule Commonly known as:  ALTACE Take 5 mg by mouth daily.

## 2017-06-10 DIAGNOSIS — M545 Low back pain: Secondary | ICD-10-CM | POA: Diagnosis not present

## 2017-06-10 DIAGNOSIS — M5412 Radiculopathy, cervical region: Secondary | ICD-10-CM | POA: Diagnosis not present

## 2017-06-10 DIAGNOSIS — M542 Cervicalgia: Secondary | ICD-10-CM | POA: Diagnosis not present

## 2017-06-10 DIAGNOSIS — R030 Elevated blood-pressure reading, without diagnosis of hypertension: Secondary | ICD-10-CM | POA: Diagnosis not present

## 2017-06-10 DIAGNOSIS — M5416 Radiculopathy, lumbar region: Secondary | ICD-10-CM | POA: Diagnosis not present

## 2017-07-10 DIAGNOSIS — I10 Essential (primary) hypertension: Secondary | ICD-10-CM | POA: Diagnosis not present

## 2017-07-10 DIAGNOSIS — M542 Cervicalgia: Secondary | ICD-10-CM | POA: Diagnosis not present

## 2017-07-10 DIAGNOSIS — M5412 Radiculopathy, cervical region: Secondary | ICD-10-CM | POA: Diagnosis not present

## 2017-07-10 DIAGNOSIS — M5416 Radiculopathy, lumbar region: Secondary | ICD-10-CM | POA: Diagnosis not present

## 2017-07-10 DIAGNOSIS — M545 Low back pain: Secondary | ICD-10-CM | POA: Diagnosis not present

## 2017-08-25 DIAGNOSIS — I2510 Atherosclerotic heart disease of native coronary artery without angina pectoris: Secondary | ICD-10-CM | POA: Diagnosis not present

## 2017-08-25 DIAGNOSIS — E785 Hyperlipidemia, unspecified: Secondary | ICD-10-CM | POA: Diagnosis not present

## 2017-10-01 DIAGNOSIS — M961 Postlaminectomy syndrome, not elsewhere classified: Secondary | ICD-10-CM | POA: Diagnosis not present

## 2017-11-07 DIAGNOSIS — M5412 Radiculopathy, cervical region: Secondary | ICD-10-CM | POA: Diagnosis not present

## 2017-11-07 DIAGNOSIS — M961 Postlaminectomy syndrome, not elsewhere classified: Secondary | ICD-10-CM | POA: Diagnosis not present

## 2017-11-07 DIAGNOSIS — M5416 Radiculopathy, lumbar region: Secondary | ICD-10-CM | POA: Diagnosis not present

## 2017-11-07 DIAGNOSIS — M545 Low back pain: Secondary | ICD-10-CM | POA: Diagnosis not present

## 2018-01-13 DIAGNOSIS — G894 Chronic pain syndrome: Secondary | ICD-10-CM | POA: Diagnosis not present

## 2018-02-02 ENCOUNTER — Telehealth: Payer: Self-pay | Admitting: *Deleted

## 2018-02-02 NOTE — Telephone Encounter (Signed)
Notified patient that annual lung cancer screening low dose CT scan is due currently or will be in near future. Patient is out of town until May and requests that I call back at that time.

## 2018-02-10 DIAGNOSIS — S92344A Nondisplaced fracture of fourth metatarsal bone, right foot, initial encounter for closed fracture: Secondary | ICD-10-CM | POA: Diagnosis not present

## 2018-02-10 DIAGNOSIS — S92354A Nondisplaced fracture of fifth metatarsal bone, right foot, initial encounter for closed fracture: Secondary | ICD-10-CM | POA: Diagnosis not present

## 2018-02-23 DIAGNOSIS — S92344A Nondisplaced fracture of fourth metatarsal bone, right foot, initial encounter for closed fracture: Secondary | ICD-10-CM | POA: Diagnosis not present

## 2018-02-23 DIAGNOSIS — S92354A Nondisplaced fracture of fifth metatarsal bone, right foot, initial encounter for closed fracture: Secondary | ICD-10-CM | POA: Diagnosis not present

## 2018-03-10 ENCOUNTER — Ambulatory Visit (INDEPENDENT_AMBULATORY_CARE_PROVIDER_SITE_OTHER): Payer: BLUE CROSS/BLUE SHIELD | Admitting: Family Medicine

## 2018-03-10 ENCOUNTER — Other Ambulatory Visit: Payer: Self-pay

## 2018-03-10 ENCOUNTER — Encounter: Payer: Self-pay | Admitting: Family Medicine

## 2018-03-10 VITALS — BP 130/60 | HR 54 | Temp 97.8°F | Ht 69.0 in | Wt 174.0 lb

## 2018-03-10 DIAGNOSIS — G8929 Other chronic pain: Secondary | ICD-10-CM

## 2018-03-10 DIAGNOSIS — M5442 Lumbago with sciatica, left side: Secondary | ICD-10-CM

## 2018-03-10 DIAGNOSIS — M5412 Radiculopathy, cervical region: Secondary | ICD-10-CM | POA: Diagnosis not present

## 2018-03-10 MED ORDER — PREGABALIN 75 MG PO CAPS: 75.0000 mg | ORAL_CAPSULE | Freq: Two times a day (BID) | ORAL | 1 refills | Status: DC

## 2018-03-10 NOTE — Progress Notes (Signed)
Subjective:    Patient ID: Donald Roberts, Donald Roberts    DOB: February 28, 1951, 67 y.o.   MRN: [id number]  HPI   67 year old Donald Roberts with chronic neck and back pain presents for referral to pain management, Previously treated by Dr. Ollen Bowl and Dr Ethelene Hal, [facility] & R  Per pt: He reports he requested daughter to pick up pain med from Dr. Ollen Bowl 2 days early for him given he was  At [facility] out of the country.  He stated to pain management that this was unacceptable. He wishes to change to change pain med  Doctor.  Last had Opana 03/03/2018 Has been using tylenol and ibuprofen constantly since.  He is wearing a boot on right foot for metatarsal fracture. Dr. Darrelyn Hillock.  Summary of history copied below for information. Donald Roberts is a 67 year old, right-handed, Caucasian Donald Roberts, who reported experiencing chronic upper neck and lower back pain that began circa 1996 following an accident at work wherein he was hit in the head by a piece of lumber. He has been followed by Dr. Ollen Bowl for pain management that has not responded well to conservative methods. The patient would purportedly like a trial for both cervical and lumbar systems.  Review of Treatments attempted: 1) 5 surgical procedures (4 on the neck and one on the lower back: very helpful, 2) medications (Percocet and Opana): Very helpful but they reportedly keep decreasing the dose so it is not as effective, and 3) epidural steroid injections x 4: not helpful since the first one.    In discussion.. He has not tried lyrica.  Gabapentin made leg jerk. Several years ago.  Amitrypiptiline, cymbalta. He says he has use a full bottle of 150 ibuprofen and tylenol in last 2 weeks.    Tramadol does not help.  Review of Systems  Constitutional: Negative for fatigue and fever.  HENT: Negative for ear pain.   Eyes: Negative for pain.  Respiratory: Negative for cough and shortness of breath.   Cardiovascular: Negative for chest pain, palpitations and leg swelling.    Gastrointestinal: Negative for abdominal pain.  Genitourinary: Negative for dysuria.  Musculoskeletal: Positive for back pain. Negative for arthralgias.  Neurological: Negative for syncope, light-headedness and headaches.  Psychiatric/Behavioral: Negative for dysphoric mood.       Objective:   Physical Exam  Constitutional: Vital signs are normal. He appears well-developed and well-nourished.  HENT:  Head: Normocephalic.  Right Ear: Hearing normal.  Left Ear: Hearing normal.  Nose: Nose normal.  Mouth/Throat: Oropharynx is clear and moist and mucous membranes are normal.  Neck: Trachea normal. Carotid bruit is not present. No thyroid mass and no thyromegaly present.  Cardiovascular: Normal rate, regular rhythm and normal pulses. Exam reveals no gallop, no distant heart sounds and no friction rub.  No murmur heard. No peripheral edema  Pulmonary/Chest: Effort normal and breath sounds normal. No respiratory distress.  Musculoskeletal:       Right shoulder: Normal.       Left shoulder: Normal.       Cervical back: He exhibits decreased range of motion and tenderness. He exhibits no bony tenderness.       Lumbar back: He exhibits decreased range of motion and tenderness. He exhibits no bony tenderness.  Neg spurling's bilaterally   ttp diffuse across lower back and in bialteral buttocks. Neg SLR, neg faber's  Neurological: He has normal strength. No cranial nerve deficit or sensory deficit. He displays a negative Romberg sign. Coordination and gait  normal.  Skin: Skin is warm, dry and intact. No rash noted.  Psychiatric: He has a normal mood and affect. His speech is normal and behavior is normal. Thought content normal.          Assessment & Plan:   Chronic upper and lower back pain Discharged from previous pain center for unclear reason.  Pt requests a referral to a different pain centre...  He feels that narcotics are the only thing that has helped in past for pain, but is  willing to try other pain treatment. He states at pain level currently he cannot fucntion. Pain is always worse [place] given  Colder climate worsens it. Will try a trial of lyricaa nd will refer to PMR/ pain center for further recommnedations.  In past tramadol not helpful, gabapentin caused SE.  He has not tried cymbalta or amytriptiline.  NSAIDS contraindicated in CAD

## 2018-03-10 NOTE — Patient Instructions (Addendum)
Start lyrica two  times daily for pain. Can use ibuprofen 800 mg every eight hours for pain  For now. No mpore  Please stop at the front desk to set up referral.

## 2018-03-16 DIAGNOSIS — S92344A Nondisplaced fracture of fourth metatarsal bone, right foot, initial encounter for closed fracture: Secondary | ICD-10-CM | POA: Diagnosis not present

## 2018-03-16 DIAGNOSIS — S92354A Nondisplaced fracture of fifth metatarsal bone, right foot, initial encounter for closed fracture: Secondary | ICD-10-CM | POA: Diagnosis not present

## 2018-03-17 ENCOUNTER — Telehealth: Payer: Self-pay | Admitting: Family Medicine

## 2018-03-17 DIAGNOSIS — M5412 Radiculopathy, cervical region: Secondary | ICD-10-CM | POA: Insufficient documentation

## 2018-03-17 NOTE — Telephone Encounter (Signed)
Copied from CRM [id number]. Topic: Quick Communication - See Telephone Encounter >> Mar 17, 2018  9:12 AM Diana EvesHoyt, Maryann B wrote: CRM for notification. See Telephone encounter for: 03/17/18.  Pt's wife calling stating pt cant take the lyrica. She states he has taken it 4 times and has fallen 4 times.

## 2018-03-17 NOTE — Telephone Encounter (Signed)
I was told tramadol did not help in past... Donald Roberts  He still want to try this? Has he used lidoderm patches in past?   [name] his note is completed

## 2018-03-17 NOTE — Telephone Encounter (Signed)
[name] pts wife (DPR signed) said pt has fallen x 4 since starting lyrica on 03/10/18. Pt would prefer a different med. Pt cannot take gabapentin due to making pt woozy and off balance. [facility]. [name] request cb.

## 2018-03-17 NOTE — Addendum Note (Signed)
Addended by: Kerby NoraBEDSOLE, Pershing Skidmore E on: 03/17/2018 04:46 PM   Modules accepted: Level of Service

## 2018-03-17 NOTE — Telephone Encounter (Signed)
Copied from CRM [id number]. Topic: Quick Communication - See Telephone Encounter >> Mar 17, 2018  9:12 AM Diana EvesHoyt, Maryann B wrote: CRM for notification. See Telephone encounter for: 03/17/18.  Pt's wife calling stating pt cant take the lyrica. She states he has taken it 4 times and has fallen 4 times.  >> Mar 17, 2018 11:16 AM Gerrianne ScalePayne, Angela L wrote: Pt wife [name] calling to speak with Artelia LarocheRena stating that her husband would like to get Tramadol 50 mg instead of the lyrica >> Mar 17, 2018 11:30 AM Patience MuscaIsley, Harald Quevedo M, LPN wrote: I did speak with pts wife (DPR signed) and [name] did say pt wanted a different med from Lyrica; pt cannot take Gabapentin but did not request Tramadol while I was speaking with [name] who was [facility] to me [facility].

## 2018-03-18 NOTE — Telephone Encounter (Signed)
Donald AbrahamsMary Roberts with [facility] transferred Donald Roberts to Donald Roberts; Donald Roberts spoke with Donald Roberts about pain mgt appt and I spoke with Donald Roberts to ask questions of Dr Donald Roberts. Pt has used lidoderm patches previously without relief and does not want patches; pt does want to try tramadol again. Donald Roberts thinks something has come loose in neck and/ or back;[name] wants pt to go for second opinion from [facility]. Donald Roberts said pt is willing to get second opinion.[facility]. Donald Roberts request cb after Dr Donald Roberts reviews. Donald Roberts is aware Dr Donald Roberts will be back in office on 03/19/18. Donald [facility]

## 2018-03-19 MED ORDER — TRAMADOL HCL 50 MG PO TABS: 50.0000 mg | ORAL_TABLET | Freq: Three times a day (TID) | ORAL | 0 refills | Status: DC | PRN

## 2018-03-23 ENCOUNTER — Telehealth: Payer: Self-pay | Admitting: Family Medicine

## 2018-03-23 DIAGNOSIS — M549 Dorsalgia, unspecified: Principal | ICD-10-CM

## 2018-03-23 DIAGNOSIS — M5412 Radiculopathy, cervical region: Secondary | ICD-10-CM

## 2018-03-23 DIAGNOSIS — G8929 Other chronic pain: Secondary | ICD-10-CM

## 2018-03-23 NOTE — Telephone Encounter (Signed)
Pt last seen 03/10/18 and pain mgt referral being worked on; please see 03/17/18 phone note.

## 2018-03-23 NOTE — Telephone Encounter (Signed)
Copied from CRM [id number]. Topic: Quick Communication - See Telephone Encounter >> Mar 23, 2018 11:40 AM Cipriano BunkerLambe, Annette S wrote: CRM for notification.   Pt. Wife, said she has asked for [facility] Please give them a call   See Telephone encounter for: 03/23/18.

## 2018-03-24 NOTE — Telephone Encounter (Signed)
Spoke with [name].  They would like a referral for a new [facility] Sur[facility] at [facility].  Since the pain management center he was discharged from is affiliated [facility] in [place], he can't be seen by his neuro surgeon there anymore as well,  according to the letter he received from them.    [name] also wanted to let Dr. [name] know they appreciate the referral to the new pain clinic.  Donald Roberts is scheduled to see them tomorrow.

## 2018-03-24 NOTE — Telephone Encounter (Signed)
Please call pt... He was supposed to have referral already in process.. What has changed?

## 2018-03-25 ENCOUNTER — Telehealth: Payer: Self-pay

## 2018-03-25 DIAGNOSIS — M542 Cervicalgia: Secondary | ICD-10-CM | POA: Diagnosis not present

## 2018-03-25 DIAGNOSIS — M960 Pseudarthrosis after fusion or arthrodesis: Secondary | ICD-10-CM | POA: Diagnosis not present

## 2018-03-25 DIAGNOSIS — M79602 Pain in left arm: Secondary | ICD-10-CM | POA: Diagnosis not present

## 2018-03-25 DIAGNOSIS — M961 Postlaminectomy syndrome, not elsewhere classified: Secondary | ICD-10-CM | POA: Diagnosis not present

## 2018-03-25 NOTE — Telephone Encounter (Signed)
Left message for patient to call Marenda Accardi back in regards to a referral-Artavious Trebilcock V Zyire Eidson, RMA   

## 2018-04-01 ENCOUNTER — Ambulatory Visit: Payer: BLUE CROSS/BLUE SHIELD | Attending: Nurse Practitioner | Admitting: Nurse Practitioner

## 2018-04-01 ENCOUNTER — Encounter: Payer: Self-pay | Admitting: Nurse Practitioner

## 2018-04-01 DIAGNOSIS — M899 Disorder of bone, unspecified: Secondary | ICD-10-CM | POA: Insufficient documentation

## 2018-04-01 DIAGNOSIS — Z9889 Other specified postprocedural states: Secondary | ICD-10-CM | POA: Diagnosis not present

## 2018-04-01 DIAGNOSIS — M79671 Pain in right foot: Secondary | ICD-10-CM | POA: Diagnosis not present

## 2018-04-01 DIAGNOSIS — M545 Low back pain: Secondary | ICD-10-CM | POA: Diagnosis not present

## 2018-04-01 DIAGNOSIS — M79672 Pain in left foot: Secondary | ICD-10-CM | POA: Diagnosis not present

## 2018-04-01 DIAGNOSIS — Z789 Other specified health status: Secondary | ICD-10-CM

## 2018-04-01 DIAGNOSIS — G894 Chronic pain syndrome: Secondary | ICD-10-CM

## 2018-04-01 DIAGNOSIS — Z86010 Personal history of colonic polyps: Secondary | ICD-10-CM | POA: Insufficient documentation

## 2018-04-01 DIAGNOSIS — Z7982 Long term (current) use of aspirin: Secondary | ICD-10-CM | POA: Diagnosis not present

## 2018-04-01 DIAGNOSIS — I2510 Atherosclerotic heart disease of native coronary artery without angina pectoris: Secondary | ICD-10-CM | POA: Diagnosis not present

## 2018-04-01 DIAGNOSIS — Z79899 Other long term (current) drug therapy: Secondary | ICD-10-CM | POA: Diagnosis not present

## 2018-04-01 DIAGNOSIS — Z87891 Personal history of nicotine dependence: Secondary | ICD-10-CM | POA: Diagnosis not present

## 2018-04-01 DIAGNOSIS — M1711 Unilateral primary osteoarthritis, right knee: Secondary | ICD-10-CM | POA: Diagnosis not present

## 2018-04-01 DIAGNOSIS — Z8249 Family history of ischemic heart disease and other diseases of the circulatory system: Secondary | ICD-10-CM | POA: Insufficient documentation

## 2018-04-01 DIAGNOSIS — M542 Cervicalgia: Secondary | ICD-10-CM | POA: Diagnosis not present

## 2018-04-01 DIAGNOSIS — M25512 Pain in left shoulder: Secondary | ICD-10-CM | POA: Insufficient documentation

## 2018-04-01 DIAGNOSIS — M4722 Other spondylosis with radiculopathy, cervical region: Secondary | ICD-10-CM | POA: Insufficient documentation

## 2018-04-01 DIAGNOSIS — Z79891 Long term (current) use of opiate analgesic: Secondary | ICD-10-CM | POA: Diagnosis not present

## 2018-04-01 DIAGNOSIS — E785 Hyperlipidemia, unspecified: Secondary | ICD-10-CM | POA: Insufficient documentation

## 2018-04-01 DIAGNOSIS — G8929 Other chronic pain: Secondary | ICD-10-CM | POA: Insufficient documentation

## 2018-04-01 NOTE — Patient Instructions (Signed)

## 2018-04-01 NOTE — Progress Notes (Signed)
Nursing Pain Medication Assessment:  Safety precautions to be maintained throughout the outpatient stay will include: orient to surroundings, keep bed in low position, maintain call bell within reach at all times, provide assistance with transfer out of bed and ambulation.  Medication Inspection Compliance: Pill count conducted under aseptic conditions, in front of the patient. Neither the pills nor the bottle was removed from the patient's sight at any time. Once count was completed pills were immediately returned to the patient in their original bottle.  Medication: Oxymorphone (Opana) Pill/Patch Count: 3 of 180 pills remain Pill/Patch Appearance: Markings consistent with prescribed medication Bottle Appearance: Standard pharmacy container. Clearly labeled. Filled Date: 03 / 04 / 2019  Last Medication intake:  Today.

## 2018-04-01 NOTE — Progress Notes (Signed)
Patient's Name: Donald Roberts  MRN: [id number]  Referring Provider: Jinny Sanders, MD  DOB: 23-Dec-1950  PCP: Jinny Sanders, MD  DOS: 04/01/2018  Note by: Dionisio David NP  Service setting: Ambulatory outpatient  Specialty: Interventional Pain Management  Location: [facility] (AMB) Pain Management Facility    Patient type: New Patient    Primary Reason(s) for Visit: Initial Patient Evaluation CC: Back Pain (lower s/p surgery 2004); Neck Pain (left side s/p surgery2016, 2015, 2009, 2008); Shoulder Pain (left ); and Foot Pain (bilateral)  HPI  [name] is a 67 y.o. year old, male patient, who comes today for an initial evaluation. He has Hyperlipidemia; CAD (coronary artery disease); Personal history of colonic polyps; Back pain, chronic; Degenerative arthritis of right knee; Cervical spondylosis; Personal history of tobacco use, presenting hazards to health; Cervical radiculopathy, chronic; Chronic neck pain (Primary Area of Pain) (L>R); Chronic left shoulder pain (Secondary Area of Pain); Chronic bilateral low back pain without sciatica [facility] Area of Pain); Chronic pain syndrome; Long term current use of opiate analgesic; Pharmacologic therapy; Disorder of skeletal system; and Problems influencing health status on their problem list.. His primarily concern today is the Back Pain (lower s/p surgery 2004); Neck Pain (left side s/p surgery2016, 2015, 2009, 2008); Shoulder Pain (left ); and Foot Pain (bilateral)  Pain Assessment: Location: Left Neck(back lower bilateral ) Radiating: into left shoulder.  occassionally into the legs  Onset: More than a month ago Duration: Chronic pain Quality: Discomfort, Nagging, Constant, Sharp, Stabbing Severity: 4 /10 (self-reported pain score)  Note: Reported level is compatible with observation.                          Effect on ADL: just goes on and does what he needs to do.  Timing: Constant Modifying factors: medications   Onset and Duration: Gradual and  Present longer than 3 months Cause of pain: Work related accident or event Severity: Getting worse, NAS-11 at its worse: 9/10, NAS-11 at its best: 4/10, NAS-11 now: 6/10 and NAS-11 on the average: 6/10 Timing: 6 - 7 hours with no meds Aggravating Factors: Lifiting, Prolonged sitting, Prolonged standing and Surgery made it worse Alleviating Factors: Medications and Warm showers or baths Associated Problems: Inability to concentrate, Weakness, Pain that wakes patient up and Pain that does not allow patient to sleep Quality of Pain: Sharp, Stabbing and Throbbing Previous Examinations or Tests: Ct-Myelogram, MRI scan, X-rays, Nerve conduction test and Psychiatric evaluation Previous Treatments: Epidural steroid injections and Narcotic medications  The patient comes into the clinics today for the first time for a chronic pain management evaluation. According to the patient's primary area of pain is in his neck. He admits that this neck pain is related to an injury that he sustained in 2001 when a dropped board fell on his head. He admits that 6 months later he had his first surgery; a discectomy. He admits that he has had 4 additional surgeries last in May 2006. He admits that he has had interventional therapy in the past and that the first one was successful however the proceeding ones were not. He admits that this was after his surgery. He does not feel that physical therapy was effective.   His second area of pain is in his left shoulder. He admits that this pain does come from his neck. He denies any previous surgery, interventional therapy or physical therapy. He denies any numbness tingling or weakness in his  arm. He admits that he has had several nerve conduction studies.   His third area of pain is in his lower back. He describes as being midline. He denies any pain going down the legs He admits that he has had surgery on multiple disc in 2008. He did have one injection after surgery which was  effective. He admits that he didn't had physical therapy which was not effective. He denies any recent images.  He admits that he was supposed to have a spinal cord stimulator placed before being dismissed from previous pain clinic.  Today I took the time to provide the patient with information regarding this pain practice. The patient was informed that the practice is divided into two sections: an interventional pain management section, as well as a completely separate and distinct medication management section. I explained that there are procedure days for interventional therapies, and evaluation days for follow-ups and medication management. Because of the amount of documentation required during both, they are kept separated. This means that there is the possibility that he may be scheduled for a procedure on one day, and medication management the next. I have also informed him that because of staffing and facility limitations, this practice will no longer take patients for medication management only. To illustrate the reasons for this, I gave the patient the example of surgeons, and how inappropriate it would be to refer a patient to his/her care, just to write for the post-surgical antibiotics on a surgery done by a different surgeon.   Because interventional pain management is part of the board-certified specialty for the doctors, the patient was informed that joining this practice means that they are open to any and all interventional therapies. I made it clear that this does not mean that they will be forced to have any procedures done. What this means is that I believe interventional therapies to be essential part of the diagnosis and proper management of chronic pain conditions. Therefore, patients not interested in these interventional alternatives will be better served under the care of a different practitioner.  The patient was also made aware of my Comprehensive Pain Management Safety  Guidelines where by joining this practice, they limit all of their nerve blocks and joint injections to those done by our practice, for as long as we are retained to manage their care. Historic Controlled Substance Pharmacotherapy Review  PMP and historical list of controlled substances:oxycodone 10 mg,  tramadol 50 mg, diazepam 5 mg, Lyrica 75 mg, oxymorphone 10 mg, diazepam 10 mg, oxycodone/acetaminophen 10/325 mg, alprazolam 0.25 mg, hydromorphone 4 mg,  Highest opioid analgesic regimen found: oxymorphone 10 mg 2 tablets 6 times daily (fill date 11/04/2013) oxymorphone 120 mg per day Most recent opioid analgesic: oxymorphone 10 mg 6 times daily (fill date 02/02/2018) oxymorphone 60 mg per day (Per patient)  Current opioid analgesics: oxycodone 10 mg 4 times daily (fill date 03/25/2018) oxycodone 40 mg per day (Per PMP) Highest recorded MME/day: 360 mg/day MME/day: 60 mg/day Medications: The patient did not bring the medication(s) to the appointment, as requested in our "New Patient Package" Pharmacodynamics: Desired effects: Analgesia: The patient reports >50% benefit. Reported improvement in function: The patient reports medication allows him to accomplish basic ADLs. Clinically meaningful improvement in function (CMIF): Sustained CMIF goals met Perceived effectiveness: Described as relatively effective, allowing for increase in activities of daily living (ADL) Undesirable effects: Side-effects or Adverse reactions: None reported Historical Monitoring: The patient  reports that he does not use drugs. List of  all UDS Test(s): No results found for: MDMA, COCAINSCRNUR, PCPSCRNUR, PCPQUANT, CANNABQUANT, THCU, ETH List of all Serum Drug Screening Test(s):  No results found for: AMPHSCRSER, BARBSCRSER, BENZOSCRSER, COCAINSCRSER, PCPSCRSER, PCPQUANT, THCSCRSER, CANNABQUANT, OPIATESCRSER, OXYSCRSER, PROPOXSCRSER Historical Background Evaluation: [place] PDMP: Six (6) year initial data search  conducted.             [place] Department of public safety, offender search: Editor, commissioning Information) Non-contributory Risk Assessment Profile: Aberrant behavior: None observed or detected today Risk factors for fatal opioid overdose: None identified today Fatal overdose hazard ratio (HR): 2.04 for doses equal to, or higher than 100 MME/day Non-fatal overdose hazard ratio (HR): 2.88 for doses equal to, or higher than 200 MME/day Risk of opioid abuse or dependence: 0.7-3.0% with doses ? 36 MME/day and 6.1-26% with doses ? 120 MME/day. Substance use disorder (SUD) risk level: Pending results of Medical Psychology Evaluation for SUD Opioid risk tool (ORT) (Total Score):    ORT Scoring interpretation table:  Score <3 = Low Risk for SUD  Score between 4-7 = Moderate Risk for SUD  Score >8 = High Risk for Opioid Abuse   PHQ-2 Depression Scale:  Total score:    PHQ-2 Scoring interpretation table: (Score and probability of major depressive disorder)  Score 0 = No depression  Score 1 = 15.4% Probability  Score 2 = 21.1% Probability  Score 3 = 38.4% Probability  Score 4 = 45.5% Probability  Score 5 = 56.4% Probability  Score 6 = 78.6% Probability   PHQ-9 Depression Scale:  Total score:    PHQ-9 Scoring interpretation table:  Score 0-4 = No depression  Score 5-9 = Mild depression  Score 10-14 = Moderate depression  Score 15-19 = Moderately severe depression  Score 20-27 = Severe depression (2.4 times higher risk of SUD and 2.89 times higher risk of overuse)   Pharmacologic Plan: Pending ordered tests and/or consults  Meds  The patient has a current medication list which includes the following prescription(s): aspirin ec, diazepam, famotidine-calcium carbonate-magnesium hydroxide, nitroglycerin, oxymorphone, pravastatin, ramipril, and tramadol, and the following Facility-Administered Medications: sodium chloride.  Current Outpatient Medications on File Prior to Visit  Medication Sig  . aspirin  EC 81 MG tablet Take 81 mg by mouth daily.   . diazepam (VALIUM) 5 MG tablet Take 5 mg by mouth as needed for anxiety.  . famotidine-calcium carbonate-magnesium hydroxide (PEPCID COMPLETE) 10-800-165 MG CHEW chewable tablet Chew 1 tablet by mouth daily.   . nitroGLYCERIN (NITROSTAT) 0.4 MG SL tablet Place 0.4 mg under the tongue every 5 (five) minutes as needed for chest pain.  Marland Kitchen oxymorphone (OPANA) 10 MG tablet Take 20 mg by mouth every 8 (eight) hours as needed for pain.   . pravastatin (PRAVACHOL) 40 MG tablet Take 40 mg by mouth daily.  . ramipril (ALTACE) 5 MG capsule Take 5 mg by mouth daily.  . traMADol (ULTRAM) 50 MG tablet Take 1 tablet (50 mg total) by mouth every 8 (eight) hours as needed.   Current Facility-Administered Medications on File Prior to Visit  Medication  . 0.9 %  sodium chloride infusion   Imaging Review  Cervical Imaging: Cervical MR wo contrast:  Results for orders placed during the hospital encounter of 06/26/13  MR Cervical Spine Wo Contrast   Narrative *RADIOLOGY REPORT*  Clinical Data: Neck pain.  Bilateral arm pain.  Duration of symptoms 5 weeks.  Previous fusion.  MRI CERVICAL SPINE WITHOUT CONTRAST  Technique:  Multiplanar and multiecho pulse sequences of the cervical  spine, to include the craniocervical junction and cervicothoracic junction, were obtained according to standard protocol without intravenous contrast.  Comparison: Myelography 04/04/2008  Findings: The foramen magnum is widely patent.  There is mild osteoarthritis at the C1-2 articulation.  No encroachment upon the neural spaces.  C2-3:  No disc pathology.  There is mild facet degeneration on the left.  There is mild foraminal narrowing on the left without definite neural compression.  C3-4:  Spondylosis with endplate osteophytes and shallow broad- based herniation of disc material.  Effacement of the ventral subarachnoid space but no compression of the cord.  Mild  foraminal narrowing bilaterally.  C4-5:  Spondylosis with endplate osteophytes and bulging of the disc.  Uncovertebral degeneration on the right.  Foraminal stenosis on the right probably due to a combination of osteophyte and disc material.  This would likely effect the right C5 nerve root. Narrowing of ventral subarachnoid space but no compression of the cord.  C5-6:  Spondylosis with endplate osteophytes.  Narrowing of the canal.  Cord atrophy and gliosis at this level without evidence of ongoing cord compression.  Foraminal stenosis bilaterally because of osteophytic encroachment could effect either or both C6 nerve roots.  C6-7:  Previous anterior cervical discectomy and fusion.  Wide patency of the canal and foramina.  C7-T1:  Mild facet degeneration.  Minimal uncovertebral prominence bilaterally.  No compressive stenosis.  IMPRESSION: Good appearance of the fusion level of C6-7.  Spondylosis at C5-6, probably with the previous surgical discectomy.  Osteophytic encroachment upon the canal.  The cord shows atrophy and gliosis at this level presumably related to the previous compressive pathology.  No apparent ongoing compression at this level related to the cord.  There is neural foraminal stenosis bilaterally that could effect either C6 nerve root.  C2-3:  Facet arthropathy on the left.  Foraminal narrowing on the left could effect the C3 nerve root.  The joint could be painful.  C3-4:  Spondylosis with broad-based disc herniation.  Effacement of the ventral subarachnoid space without frank compression of the cord.  Mild foraminal narrowing bilaterally.  C4-5:  Foraminal stenosis on the right probably due to osteophyte and disc material.  C5 nerve root compression is likely.   Original Report Authenticated By: Nelson Chimes, M.D.   Cervical CT wo contrast:  Results for orders placed during the hospital encounter of 03/14/15  CT Cervical Spine Wo Contrast    Narrative CLINICAL DATA:  Intervertebral disc displacement. Cervical fusion. LEFT-sided neck and shoulder pain for 2 years.  EXAM: CT CERVICAL SPINE WITHOUT CONTRAST  TECHNIQUE: Multidetector CT imaging of the cervical spine was performed without intravenous contrast. Multiplanar CT image reconstructions were also generated.  COMPARISON:  CT myelogram 10/06/2013.  FINDINGS: Alignment: Trace anterolisthesis of C2 on C3 associated with facet arthrosis. Mild dextroconvex torticollis.  Craniocervical junction: The odontoid is intact. Occipital condyles intact. C1 ring intact. Atlantodental degenerative disease.  Vertebrae: No aggressive osseous lesions. Negative for fracture. C4 through C7 ACDF.  Paraspinal soft tissues: Carotid atherosclerosis.  Lung apices: Emphysema.  C2-C3: Central canal appears patent. Severe LEFT facet arthrosis with LEFT foraminal stenosis. Marked overgrowth of the LEFT facet joint with both anterior and posterior osteophytes. RIGHT facet joint appears normal. Central canal and RIGHT foramen patent.  C3-C4: Mild to moderate central stenosis. Calcified broad-based disc protrusion. Short pedicles are present with bilateral right-greater-than-left uncovertebral spurring and mild RIGHT foraminal stenosis.  C4-C5: Pseudoarthrosis. The C4 ACDF screws are loose and abut the superior aspect of the  C4-C5 cage graft. This may represent subsidence into the inferior C4 endplate or mechanical loosening. Bilateral foraminal stenosis due to uncovertebral spurring and short pedicles.  C5-C6: C5 ACDF screws show good purchase without evidence of loosening. Despite the intact hardware, there is no bridging bone across C5-C6 compatible with a delayed union. No significant change from prior CT myelogram 2014. Findings are consistent with ongoing motion. There has been no bridging bone development and only a small amount of incorporation of the interbody cage graft  which is placed eccentrically to the LEFT. Left-greater-than-right uncovertebral spurring.  C6-C7: Partial incorporation of interbody bone graft that appears similar to the prior exam. C6-C7 ACDF. There may be a tiny amount of bridging bone centrally however the amount of bridging bone is less than expected when comparing to the prior CT myelogram.  C7-T1: Disc degeneration. Central canal appears patent.  IMPRESSION: 1. Disc contiguous fusion plates from C4 through C6 and at C6-C7. 2. C4-C5 pseudoarthrosis. 3. Delayed union at C5-C6.  No hardware loosening. 4. C6-C7 persistent nonunion. Less than expected bridging bone at C6-C7 without significant interval change from CT myelogram from 2014.   Electronically Signed   By: Dereck Ligas M.D.   On: 03/14/2015 11:05    Cervical CT w contrast:  Results for orders placed during the hospital encounter of 10/06/13  CT Cervical Spine W Contrast   Narrative CLINICAL DATA:  Recent operation with new left upper extremity weakness. Evaluate recent cervical fusion.  TECHNIQUE: Contiguous axial images were obtained through the Cervical spine without infusion. Coronal and sagittal reconstructions were obtained of the axial image sets.  FLUOROSCOPY TIME:  1 min 2 seconds  PROCEDURE: LUMBAR PUNCTURE FOR CERVICAL MYELOGRAM  After thorough discussion of risks and benefits of the procedure including bleeding, infection, injury to nerves, blood vessels, adjacent structures as well as headache and CSF leak, written and oral informed consent was obtained. Consent was obtained by Dr. Dereck Ligas. We discussed the high likelihood of obtaining a diagnostic study.  Patient was positioned prone on the fluoroscopy table. Local anesthesia was provided with 1% lidocaine without epinephrine after prepped and draped in the usual sterile fashion. Puncture was performed at L4-L5 using a 3 1/2 inch 22-gauge spinal needle via midline approach.  Using a single pass through the dura, the needle was placed within the thecal sac, with return of clear CSF. 8 mL of Omnipaque-300 was injected into the thecal sac, with normal opacification of the nerve roots and cauda equina consistent with free flow within the subarachnoid space. The patient was then moved to the trendelenburg position and contrast flowed into the Cervical spine region.  I personally performed the lumbar puncture and administered the intrathecal contrast. I also personally supervised acquisition of the myelogram images.  FINDINGS: CERVICAL MYELOGRAM FINDINGS:  Cervical spinal alignment appears anatomic aside from trace anterolisthesis of C2 on C3. The prevertebral soft tissues are within normal limits. There is a new C4 through C7 ACDF with discectomy at C4-C5 and C5-C6. Existing C6-C7 ACDF plate. Interbody bone graft is present at C4-C5 and C5-C6. There is a shallow anterior extradural impression at C3-C4 associated with disc bulging that appears unchanged from the prior MRI 06/26/2013. There is no pathologic motion identified with flexion and extension maneuvers. No hardware movement is seen. Due to the long segment fusion, flexion and extension range of motion are suboptimal.  CT CERVICAL MYELOGRAM FINDINGS:  There is a mild dextro convex torticollis at the time of CT scanning. Trace anterolisthesis  of C2 on C3 remains present. 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 Atlantodental degenerative disease. Craniocervical alignment appears normal.  C2-C3: Severe left facet arthrosis with left foraminal stenosis potentially affecting the left C3 nerve. Right foramen and central canal patent. Left posterior facet spurring and overgrowth is present along with subchondral cystic changes in the inferior C2 articular process.  C3-C4: Broad-based central disc protrusion is  present with mild central stenosis. AP diameter of the thecal sac is 9 mm. There is flattening of the ventral cervical cord. There is right greater than left mild bilateral foraminal stenosis potentially affecting the C4 nerves.  C4-C5: Discectomy with ACDF. The C4 ACDF screws are intact however the right-sided screw breaches the inferior cortex of the vertebral body of C4. Central canal has been decompressed through discectomy. 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 This is greater on the right than left. Small residual lateral osteophyte is present in the lateral anterior left foramen potentially affecting the left C5 nerve.  C5-C6: Discectomy with central canal decompression. Mild residual disc osteophyte complex with mild central stenosis. Short pedicles are again noted. The cervical cord shows myelomalacia with thinning of the cord. Residual bilateral foraminal stenosis is present associated with uncovertebral spurring. The C5 and C6 pedicle screws have good position.  C6-C7: Unchanged C6-C7 ACDF plate. There is no bridging bone across the disk space compatible with delayed union. No convincing evidence of pseudoarthrosis. The central canal is decompressed. Residual/ recurrent bilateral foraminal stenosis, greater on the left than right associated with uncovertebral spurring.  C7-T1:  Central canal patent. Mild congenital foraminal stenosis.  IMPRESSION: 1. Technically successful lumbar puncture for cervical myelogram. 2. Recent C4 through C6 ACDF. The C4 right pedicle screw breeches the inferior endplate of the C4 vertebra. Right-greater-than-left residual foraminal narrowing associated with short pedicles and uncovertebral spurring. Small lateral spur on the left at C4-C5. 3. C5-C6 residual bilateral foraminal stenosis associated with short pedicles uncovertebral spurring. C5-C6  cervical cord myelomalacia. Central canal is decompressed with mild residual osteophyte. 4. C6-C7 unchanged ACDF plate with delayed union across the disk space. No bridging bone is identified however there is no hardware complication at K7-Q2. Bilateral foraminal stenosis due to uncovertebral spurring. 5. Unchanged C2-C3 and C3-C4 degenerative disease.   Electronically Signed   By: Dereck Ligas M.D.   On: 10/06/2013 09:18   Cervical DG 2-3 views:  Results for orders placed during the hospital encounter of 03/14/15  DG Cervical Spine 2-3 Views   Narrative CLINICAL DATA:  67 year old male for C2-3 fusion. Initial encounter.  EXAM: CERVICAL SPINE - 2-3 VIEW  COMPARISON:  03/14/2015.  FINDINGS: Two intraoperative C-arm views of the cervical spine submitted for review after surgery.  Patient is post fusion C4 through C7 as on prior CT.  Inferior needle is directed superiorly projecting over the C2-3 disc space.  Superior needle is directed over the C3-4 disc space.  Patient is intubated with with nasogastric tube with pH probe in place. Surgical sponge in place.  IMPRESSION: Metallic probes directed to the C2-3 and C3-4 disc space as detailed above.   Electronically Signed   By: Genia Del M.D.   On: 03/14/2015 14:45    Cervical DG complete:  Results for orders placed in visit on 02/15/00  DG Cervical Spine Complete   Narrative FINDINGS CLINICAL DATA:  SILVER TRAUMA. TWO VIEW  CHEST: TWO VIEWS OF THE CHEST COMPARED TO THE PRIOR STUDY FROM 1992.  THE CARDIAC SILHOUETTE IS UPPER LIMITS OF NORMAL AND STABLE.  MEDIASTINAL AND HILAR CONTOURS ARE WITHIN NORMAL LIMITS AND UNCHANGED.  THE LUNGS ARE CLEAR.  NO EFFUSIONS ARE SEEN.  NO PNEUMOTHORAX.  THE BONY STRUCTURES ARE INTACT. IMPRESSION NO EVIDENCE OF ACUTE CARDIOPULMONARY PROCESS. PELVIS: THERE IS NO EVIDENCE OF FRACTURE OR DIASTASIS. NO OTHER SIGNIFICANT BONE OR SOFT TISSUE ABNORMALITIES ARE  IDENTIFIED. IMPRESSION: NORMAL STUDY. CERVICAL SPINE SERIES: MULTIPLE VIEWS OF THE CERVICAL SPINE DEMONSTRATE GOOD ALIGNMENT ON THE LATERAL FILM.  THERE IS SEVERE DEGENERATIVE CHANGES AT C5-6.  IT IS POSSIBLE THE PATIENT COULD HAVE HAD A PREVIOUS FUSION AT THIS LEVEL WITH AN INTERBODY BONE PLUG.  NO ACUTE BONY ABNORMALITY.  THE OBLIQUE FILMS DEMONSTRATE MILD FORAMINAL NARROWING AT ALL LEVELS.  THE FACET JOINTS APPEAR NORMAL.  OPEN-MOUTH ODONTOID SHOWS NORMAL DENS AND C1-C2 ARTICULATIONS. IMPRESSION: NO ACUTE BONY ABNORMALITY OF THE CERVICAL SPINE. SEVERE DEGENERATIVE DISC DISEASE C5-6 VERSUS INTERBODY FUSION.   Cervical DG Myelogram views:  Results for orders placed during the hospital encounter of 10/06/13  DG Myelogram Cervical   Narrative CLINICAL DATA:  Recent operation with new left upper extremity weakness. Evaluate recent cervical fusion.  TECHNIQUE: Contiguous axial images were obtained through the Cervical spine without infusion. Coronal and sagittal reconstructions were obtained of the axial image sets.  FLUOROSCOPY TIME:  1 min 2 seconds  PROCEDURE: LUMBAR PUNCTURE FOR CERVICAL MYELOGRAM  After thorough discussion of risks and benefits of the procedure including bleeding, infection, injury to nerves, blood vessels, adjacent structures as well as headache and CSF leak, written and oral informed consent was obtained. Consent was obtained by Dr. Dereck Ligas. We discussed the high likelihood of obtaining a diagnostic study.  Patient was positioned prone on the fluoroscopy table. Local anesthesia was provided with 1% lidocaine without epinephrine after prepped and draped in the usual sterile fashion. Puncture was performed at L4-L5 using a 3 1/2 inch 22-gauge spinal needle via midline approach. Using a single pass through the dura, the needle was placed within the thecal sac, with return of clear CSF. 8 mL of Omnipaque-300 was injected into the thecal sac, with  normal opacification of the nerve roots and cauda equina consistent with free flow within the subarachnoid space. The patient was then moved to the trendelenburg position and contrast flowed into the Cervical spine region.  I personally performed the lumbar puncture and administered the intrathecal contrast. I also personally supervised acquisition of the myelogram images.  FINDINGS: CERVICAL MYELOGRAM FINDINGS:  Cervical spinal alignment appears anatomic aside from trace anterolisthesis of C2 on C3. The prevertebral soft tissues are within normal limits. There is a new C4 through C7 ACDF with discectomy at C4-C5 and C5-C6. Existing C6-C7 ACDF plate. Interbody bone graft is present at C4-C5 and C5-C6. There is a shallow anterior extradural impression at C3-C4 associated with disc bulging that appears unchanged from the prior MRI 06/26/2013. There is no pathologic motion identified with flexion and extension maneuvers. No hardware movement is seen. Due to the long segment fusion, flexion and extension range of motion are suboptimal.  CT CERVICAL MYELOGRAM FINDINGS:  There is a mild dextro convex torticollis at the time of CT scanning. Trace anterolisthesis of C2 on C3 remains present. 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 Atlantodental degenerative disease. Craniocervical alignment appears normal.  C2-C3: Severe left facet  arthrosis with left foraminal stenosis potentially affecting the left C3 nerve. Right foramen and central canal patent. Left posterior facet spurring and overgrowth is present along with subchondral cystic changes in the inferior C2 articular process.  C3-C4: Broad-based central disc protrusion is present with mild central stenosis. AP diameter of the thecal sac is 9 mm. There is flattening of the ventral cervical cord. There is right greater than left mild  bilateral foraminal stenosis potentially affecting the C4 nerves.  C4-C5: Discectomy with ACDF. The C4 ACDF screws are intact however the right-sided screw breaches the inferior cortex of the vertebral body of C4. Central canal has been decompressed through discectomy. 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 This is greater on the right than left. Small residual lateral osteophyte is present in the lateral anterior left foramen potentially affecting the left C5 nerve.  C5-C6: Discectomy with central canal decompression. Mild residual disc osteophyte complex with mild central stenosis. Short pedicles are again noted. The cervical cord shows myelomalacia with thinning of the cord. Residual bilateral foraminal stenosis is present associated with uncovertebral spurring. The C5 and C6 pedicle screws have good position.  C6-C7: Unchanged C6-C7 ACDF plate. There is no bridging bone across the disk space compatible with delayed union. No convincing evidence of pseudoarthrosis. The central canal is decompressed. Residual/ recurrent bilateral foraminal stenosis, greater on the left than right associated with uncovertebral spurring.  C7-T1:  Central canal patent. Mild congenital foraminal stenosis.  IMPRESSION: 1. Technically successful lumbar puncture for cervical myelogram. 2. Recent C4 through C6 ACDF. The C4 right pedicle screw breeches the inferior endplate of the C4 vertebra. Right-greater-than-left residual foraminal narrowing associated with short pedicles and uncovertebral spurring. Small lateral spur on the left at C4-C5. 3. C5-C6 residual bilateral foraminal stenosis associated with short pedicles uncovertebral spurring. C5-C6 cervical cord myelomalacia. Central canal is decompressed with mild residual osteophyte. 4. C6-C7 unchanged ACDF plate with delayed union across the disk space. No  bridging bone is identified however there is no hardware complication at Z6-X0. Bilateral foraminal stenosis due to uncovertebral spurring. 5. Unchanged C2-C3 and C3-C4 degenerative disease.   Electronically Signed   By: Dereck Ligas M.D.   On: 10/06/2013 09:18   Shoulder Imaging:  Results for orders placed during the hospital encounter of 12/25/12  DG Shoulder Right   Narrative *RADIOLOGY REPORT*  Clinical Data: Right shoulder pain  RIGHT SHOULDER - 2+ VIEW  Comparison: None.  Findings: No fracture dislocation of the right shoulder.  Mild spurring of the acromioclavicular joint.  IMPRESSION: No acute osseous abnormality.   Original Report Authenticated By: Suzy Bouchard, M.D.    Shoulder-L DG:  Results for orders placed during the hospital encounter of 04/07/05  DG Shoulder Left   Narrative Clinical Data:  Patient fell of a ladder and has left shoulder pain.  LEFT SHOULDER - THREE VIEW:  Comparison:  No prior studies. Findings:  There is an appearance of subchondral cyst formation anteriorly at the base of the greater tuberosity.  No malalignment at the acromion clavicular joint.  No visible fracture.  There is irregularity along the superior cortex of the posterolateral portion of the left 2nd rib.  This was not visible on a prior chest radiograph of 04/20/04.  If the patient has new pain immediately along the course of the posterolateral 2nd rib then this may well represent a post-traumatic finding.  Also consider bone scan for further work up.  IMPRESSION: 1.  Subchondral cyst is present anteriorly in the humeral head.  No visible fracture.  Consider MRI if the patient's shoulder pain persists.     2.  Irregularity along the left 2nd rib, cannot exclude post-traumatic or neoplastic etiology.  Correlate with any new tenderness directly over the left 2nd rib posterolaterally and consider further imaging such as bone scan.  Provider: Burnett Sheng  Lumbosacral  Imaging: Lumbar MR wo contrast:  Results for orders placed during the hospital encounter of 06/09/07  MR Lumbar Spine Wo Contrast   Narrative Clinical Data: 67 year old male with low back pain radiating to left hip and leg for one year.  MRI LUMBAR SPINE WITHOUT CONTRAST:  Technique: Multiplanar and multiecho pulse sequences of the lumbar spine, to include the lower thoracic and upper sacral regions, were obtained according to standard protocol without IV contrast.  Comparison: 04/10/06.  Findings: Normal signal is present in the conus medullaris which terminates at L1 . Endplate marrow changes are more pronounced, particularly at L2-3 where type I Modic changes have progressed. There is also some progression of osteophyte formation at this level. Vertebral body heights are maintained. Alignment is anatomic.   Small cyst at the lower pole of the left kidney is not significantly changed in the interval. Soft tissues are otherwise unremarkable. Individual disk levels are as follows:  T12-L1: Negative.  L1-2: Negative.  L2-3: There is progression of moderate biforaminal narrowing. Central canal stenosis similar to the prior exam.   L3-4: Moderate right and mild left foraminal narrowing is similar to the prior exam. Broad-based disk bulge and facet hypertrophy contribute.  L4-5: Broad-based disk bulge with central protrusion is redemonstrated. There is lateral recess narrowing, left greater than right. This may have progressed some in the interval. Mild to moderate biforaminal narrowing is stable.  L5-S1: Disk bulging is eccentric to the left. No significant stenosis.  IMPRESSION:  1. Progression of endplate marrow changes at L2-3.  2. Slight progression of biforaminal narrowing, L2-3.  3. Stable disk disease at L3-4.  4. Stable disk disease at L4-5, including central protrusion.  Provider: Sammie Bench  Lumbar CT w contrast:  Results for orders placed during the hospital encounter of 07/12/09  CT  Lumbar Spine W Contrast   Narrative MYELOGRAM  INJECTION   Technique:  Informed consent was obtained from the patient prior to the procedure, including potential complications of headache, allergy, infection and pain. Specific instructions were given regarding 24 hour bedrest post procedure to prevent post-LP headache.  A timeout procedure was performed.  With the patient prone, the lower back was prepped with Betadine.  1% Lidocaine was used for local anesthesia.  Lumbar puncture was performed by the radiologist at the L3 level using a 22 gauge needle with return of clear CSF.  15 cc of Omnipaque 180 was injected into the subarachnoid space .   IMPRESSION: Successful injection of  intrathecal contrast for myelography.   MYELOGRAM LUMBAR   Clinical Data:  Low back and bilateral leg pain.  Previous lumbar surgeries status post L2-L5 fusion.   Comparison: Most recent CT myelogram 04/04/2008.   Findings: Anatomic alignment.  Hardware intact although bilateral L5 screw loosening noted.  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  No dynamic instability.  No significant adjacent segment disease.  No nerve root cut off or spinal stenosis.   Fluoroscopy Time: 2.12 minutes   IMPRESSION: As above.   CT MYELOGRAPHY LUMBAR SPINE   Technique: Multidetector CT imaging of the lumbar spine was performed  following myelography.  Multiplanar CT image reconstructions were also generated.   Findings:  No prevertebral or paraspinous masses.   L1-2: Normal.   L2-3: Satisfactory post fusion appearance.  Hardware intact.  Mild left neural foraminal narrowing due to bony overgrowth but no definite L2 compression.   L3-4: Solid fusion.  No neural compression.  Hardware intact and appropriately placed.   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  The interbody spacer is solidly incorporated into the L5 vertebral body but there is lucency across its superior aspect with  nonunion with respect to the L4 vertebral body.  This was noted previously.   L5-S1: Mild facet arthropathy. Mild annular bulging. No stenosis or disc protrusion. Mild neural foraminal narrowing appears non compressive.   Compared to the prior CT myelogram there is little change.   IMPRESSION: No significant compressive pathology at any level.   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  Dynamic standing flexion/extension views do not demonstrate any significant movement at the L4-5 level.   No significant adjacent segment disease at L1-2 or L5-S1.  Provider: Curtis Sites  Lumbar DG 2-3 views:  Results for orders placed during the hospital encounter of 06/24/07  DG Lumbar Spine 2-3 Views   Narrative History: L2-L5 laminectomies, PLIF   LUMBAR SPINE 2 VIEWS:   Two digital C-arm fluoroscopic images obtained intraoperatively. Preceding MRI lumbar spine of 06/09/2007 labeled with 5 lumbar vertebra.   Bilateral pedicle screws at L2, L3, L4, and L5.  Disc prostheses noted at L2-L3, L3-L4, and L4-L5 discs. No subluxation on lateral view. No fracture or bone destruction.   IMPRESSION: Intraoperative images during posterior fusion L2-L5.  Provider: Mila Palmer   Lumbar DG Myelogram views:  Results for orders placed during the hospital encounter of 07/12/09  DG Myelogram Lumbar   Narrative MYELOGRAM  INJECTION   Technique:  Informed consent was obtained from the patient prior to the procedure, including potential complications of headache, allergy, infection and pain. Specific instructions were given regarding 24 hour bedrest post procedure to prevent post-LP headache.  A timeout procedure was performed.  With the patient prone, the lower back was prepped with Betadine.  1% Lidocaine was used for local anesthesia.  Lumbar puncture was performed by the radiologist at the L3 level using a 22 gauge needle with return of clear CSF.  15 cc of Omnipaque 180 was  injected into the subarachnoid space .   IMPRESSION: Successful injection of  intrathecal contrast for myelography.   MYELOGRAM LUMBAR   Clinical Data:  Low back and bilateral leg pain.  Previous lumbar surgeries status post L2-L5 fusion.   Comparison: Most recent CT myelogram 04/04/2008.   Findings: Anatomic alignment.  Hardware intact although bilateral L5 screw loosening noted.  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  No dynamic instability.  No significant adjacent segment disease.  No nerve root cut off or spinal stenosis.   Fluoroscopy Time: 2.12 minutes   IMPRESSION: As above.   CT MYELOGRAPHY LUMBAR SPINE   Technique: Multidetector CT imaging of the lumbar spine was performed following myelography.  Multiplanar CT image reconstructions were also generated.   Findings:  No prevertebral or paraspinous masses.   L1-2: Normal.   L2-3: Satisfactory post fusion appearance.  Hardware intact.  Mild left neural foraminal narrowing due to bony overgrowth but no definite L2 compression.   L3-4: Solid fusion.  No neural compression.  Hardware intact and appropriately placed.   L4-5: There is  a nonunion with loosening of the L5 screws bilaterally.  The interbody spacer is solidly incorporated into the L5 vertebral body but there is lucency across its superior aspect with nonunion with respect to the L4 vertebral body.  This was noted previously.   L5-S1: Mild facet arthropathy. Mild annular bulging. No stenosis or disc protrusion. Mild neural foraminal narrowing appears non compressive.   Compared to the prior CT myelogram there is little change.   IMPRESSION: No significant compressive pathology at any level.   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  Dynamic standing flexion/extension views do not demonstrate any significant movement at the L4-5 level.   No significant adjacent segment disease at L1-2 or L5-S1.  Provider: Candis Schatz   Note:  Available results from prior imaging studies were reviewed.        ROS  Cardiovascular History: Daily Aspirin intake and Heart murmur Pulmonary or Respiratory History: Smoking Neurological History: No reported neurological signs or symptoms such as seizures, abnormal skin sensations, urinary and/or fecal incontinence, being born with an abnormal open spine and/or a tethered spinal cord Review of Past Neurological Studies:  Results for orders placed or performed in visit on 02/15/00  CT Head Wo Contrast   Narrative   FINDINGS CLINICAL DATA:  SILVER TRAUMA.  MULTIPLE HEAD LACERATIONS. CRANIAL CT: STANDARD CRANIAL CT WAS PERFORMED WITHOUT IV CONTRAST.  THE SCAN IS SUBOPTIMAL BECAUSE WE HAD TO INCREASE OUR SCAN TIME SIGNIFICANTLY BECAUSE WE TRIED THREE TIMES TO SCAN THE PATIENT BUT THERE WAS EXCESSIVE PATIENT MOTION. INTRACRANIALLY, THE VENTRICLES ARE NORMAL IN SIZE, LOCATION AND CONFIGURATION.  NO DEFINITE EXTRA- AXIAL FLUID COLLECTIONS ARE SEEN AND NO ACUTE INTRACRANIAL PROCESS IS IDENTIFIED. SUBTLE FINDINGS COULD BE MISSED SUCH AS SHEAR INJURIES. EXAMINATION OF THE BONY STRUCTURES DEMONSTRATE NO CALVARIAL FRACTURES.  THERE IS A LACERATION AT THE VERTEX WITH WHAT APPEARS TO BE SOME FOREIGN BODY DEBRIS IN THE SCALP.  THE VISUALIZED PARANASAL SINUSES AND MASTOID AIR CELLS ARE CLEAR. IMPRESSION 1.  NO GROSS INTRACRANIAL ABNORMALITY IS IDENTIFIED. 2.  RADIOPAQUE DEBRIS IN A POSTERIOR VERTEX SCALP LACERATION.  NO DEFINITE SKULL FRACTURES ARE SEEN.   Psychological-Psychiatric History: No reported psychological or psychiatric signs or symptoms such as difficulty sleeping, anxiety, depression, delusions or hallucinations (schizophrenial), mood swings (bipolar disorders) or suicidal ideations or attempts Gastrointestinal History: No reported gastrointestinal signs or symptoms such as vomiting or evacuating blood, reflux, heartburn, alternating episodes of diarrhea and constipation, inflamed or  scarred liver, or pancreas or irrregular and/or infrequent bowel movements Genitourinary History: No reported renal or genitourinary signs or symptoms such as difficulty voiding or producing urine, peeing blood, non-functioning kidney, kidney stones, difficulty emptying the bladder, difficulty controlling the flow of urine, or chronic kidney disease Hematological History: No reported hematological signs or symptoms such as prolonged bleeding, low or poor functioning platelets, bruising or bleeding easily, hereditary bleeding problems, low energy levels due to low hemoglobin or being anemic Endocrine History: No reported endocrine signs or symptoms such as high or low blood sugar, rapid heart rate due to high thyroid levels, obesity or weight gain due to slow thyroid or thyroid disease Rheumatologic History: No reported rheumatological signs and symptoms such as fatigue, joint pain, tenderness, swelling, redness, heat, stiffness, decreased range of motion, with or without associated rash Musculoskeletal History: Negative for myasthenia gravis, muscular dystrophy, multiple sclerosis or malignant hyperthermia Work History: Retired  Allergies  Mr. Stayer has No Known Allergies.  Laboratory Chemistry  Inflammation Markers No results found  for: CRP, ESRSEDRATE (CRP: Acute Phase) (ESR: Chronic Phase) Renal Function Markers Lab Results  Component Value Date   BUN 11 12/30/2016   CREATININE 0.89 12/30/2016   GFRAA >90 03/14/2015   GFRNONAA >90 03/14/2015   Hepatic Function Markers Lab Results  Component Value Date   AST 15 12/30/2016   ALT 10 12/30/2016   ALBUMIN 4.2 12/30/2016   ALKPHOS 55 12/30/2016   HCVAB NEGATIVE 12/30/2016   Electrolytes Lab Results  Component Value Date   NA 138 12/30/2016   K 4.3 12/30/2016   CL 101 12/30/2016   CALCIUM 9.6 12/30/2016   Neuropathy Markers No results found for: [id number] Bone Pathology Markers Lab Results  Component Value Date   ALKPHOS 55  12/30/2016   CALCIUM 9.6 12/30/2016   Coagulation Parameters Lab Results  Component Value Date   INR 1.0 ratio 04/06/2009   LABPROT 11.3 04/06/2009   APTT 28.1 04/06/2009   PLT 263.0 12/30/2016   Cardiovascular Markers Lab Results  Component Value Date   HGB 14.3 12/30/2016   HCT 42.2 12/30/2016   Note: Lab results reviewed.  [place]  Drug: Mr. Souter  reports that he does not use drugs. Alcohol:  reports that he drinks alcohol. Tobacco:  reports that he quit smoking about 15 months ago. His smoking use included e-cigarettes. He has a 40.00 pack-year smoking history. He has never used smokeless tobacco. Medical:  has a past medical history of Abdominal pain, generalized (07/18/2008), ALLERGIC RHINITIS (07/18/2008), Allergy, Back pain, chronic (10/01/2011), Carpal tunnel syndrome on left, Chronic low back pain (12/25/2012), COLONIC POLYPS, HX OF (07/18/2008), CORONARY ARTERY DISEASE (07/18/2008), Degenerative arthritis of right knee (10/01/2011), DIVERTICULOSIS, COLON (07/18/2008), Gross hematuria (07/18/2008), Headache(784.0), Heart murmur, HYPERLIPIDEMIA (07/18/2008), Hypertension, and TOBACCO ABUSE (04/06/2009). Family: family history includes Heart disease in his brother, mother, and other.  Past Surgical History:  Procedure Laterality Date  . ANTERIOR CERVICAL DECOMP/DISCECTOMY FUSION N/A 09/10/2013   Procedure: ANTERIOR CERVICAL DECOMPRESSION/DISCECTOMY FUSION CERVICAL FOUR-FIVE,CERVICAL FIVE-SIX WITH PLATING AND BONE GRAFT;  Surgeon: Floyce Stakes, MD;  Location: [facility];  Service: Neurosurgery;  Laterality: N/A;  . ANTERIOR CERVICAL DECOMP/DISCECTOMY FUSION N/A 03/14/2015   Procedure: ANTERIOR CERVICAL DECOMPRESSION/DISCECTOMY FUSION  CERVICAL TWO-THREE;  Surgeon: Leeroy Cha, MD;  Location: [facility];  Service: Neurosurgery;  Laterality: N/A;  . CARDIAC CATHETERIZATION    . COLONOSCOPY    . COLONOSCOPY W/ BIOPSIES AND POLYPECTOMY     Hx: of  . lumbar surgury     dr  botero/ns; 3 level fusion per pt  . POLYPECTOMY    . s/p cervical disease x 2     Active Ambulatory Problems    Diagnosis Date Noted  . Hyperlipidemia   . CAD (coronary artery disease)   . Personal history of colonic polyps   . Back pain, chronic   . Degenerative arthritis of right knee   . Cervical spondylosis 03/14/2015  . Personal history of tobacco use, presenting hazards to health 02/03/2017  . Cervical radiculopathy, chronic 03/17/2018  . Chronic neck pain (Primary Area of Pain) (L>R) 04/01/2018  . Chronic left shoulder pain (Secondary Area of Pain) 04/01/2018  . Chronic bilateral low back pain without sciatica [facility] Area of Pain) 04/01/2018  . Chronic pain syndrome 04/01/2018  . Long term current use of opiate analgesic 04/01/2018  . Pharmacologic therapy 04/01/2018  . Disorder of skeletal system 04/01/2018  . Problems influencing health status 04/01/2018   Resolved Ambulatory Problems    Diagnosis Date Noted  . Tobacco  abuse 04/06/2009  . Allergic rhinitis   . DIVERTICULOSIS, COLON 07/18/2008  . Gross hematuria 07/18/2008  . Abdominal pain, generalized 07/18/2008  . Preventative health care 09/29/2011  . Erectile dysfunction 10/01/2011  . Encounter for long-term (current) use of high-risk medication 10/01/2011  . Finger, open wounds, complicated [id number]/[id number]  . Right knee pain 03/27/2012  . Right shoulder pain 12/25/2012  . Neck pain on left side 04/30/2013   Past Medical History:  Diagnosis Date  . Abdominal pain, generalized 07/18/2008  . ALLERGIC RHINITIS 07/18/2008  . Allergy   . Back pain, chronic 10/01/2011  . Carpal tunnel syndrome on left   . Chronic low back pain 12/25/2012  . COLONIC POLYPS, HX OF 07/18/2008  . CORONARY ARTERY DISEASE 07/18/2008  . Degenerative arthritis of right knee 10/01/2011  . DIVERTICULOSIS, COLON 07/18/2008  . Gross hematuria 07/18/2008  . Headache(784.0)   . Heart murmur   . HYPERLIPIDEMIA 07/18/2008  . Hypertension   .  TOBACCO ABUSE 04/06/2009   Constitutional Exam  General appearance: Well nourished, well developed, and well hydrated. In no apparent acute distress Vitals:   04/01/18 1020  BP: (!) 163/97  Pulse: (!) 56  Resp: 16  Temp: 97.7 F (36.5 C)  TempSrc: Oral  SpO2: 100%  Weight: 160 lb (72.6 kg)  Height: _0  (1.778 m)   BMI Assessment: Estimated body mass index is 22.96 kg/m as calculated from the following:   Height as of this encounter: _1  (1.778 m).   Weight as of this encounter: 160 lb (72.6 kg).  BMI interpretation table: BMI level Category Range association with higher incidence of chronic pain  <18 kg/m2 Underweight   18.5-24.9 kg/m2 Ideal body weight   25-29.9 kg/m2 Overweight Increased incidence by 20%  30-34.9 kg/m2 Obese (Class I) Increased incidence by 68%  35-39.9 kg/m2 Severe obesity (Class II) Increased incidence by 136%  >40 kg/m2 Extreme obesity (Class III) Increased incidence by 254%   BMI Readings from Last 4 Encounters:  04/01/18 22.96 kg/m  03/10/18 25.70 kg/m  05/22/17 24.59 kg/m  04/17/17 24.11 kg/m   Wt Readings from Last 4 Encounters:  04/01/18 160 lb (72.6 kg)  03/10/18 174 lb (78.9 kg)  05/22/17 166 lb 8 oz (75.5 kg)  04/17/17 168 lb (76.2 kg)  Psych/Mental status: Alert, oriented x 3 (person, place, & time)       Eyes: PERLA Respiratory: No evidence of acute respiratory distress  Cervical Spine Exam  Inspection: Well healed scar from previous spine surgery detected Alignment: Symmetrical Functional ROM: Unrestricted ROM      Stability: No instability detected Muscle strength & Tone: Functionally intact Sensory: Unimpaired Palpation: Non-tender              Upper Extremity (UE) Exam    Side: Right upper extremity  Side: Left upper extremity  Inspection: No masses, redness, swelling, or asymmetry. No contractures  Inspection: No masses, redness, swelling, or asymmetry. No contractures  Functional ROM: Adequate ROM           Functional ROM: Adequate ROM          Muscle strength & Tone: Functionally intact  Muscle strength & Tone: Functionally intact  Sensory: Unimpaired  Sensory: Unimpaired  Palpation: No palpable anomalies              Palpation: No palpable anomalies              Specialized Test(s): Deferred         Specialized  Test(s): Deferred          Thoracic Spine Exam  Inspection: No masses, redness, or swelling Alignment: Symmetrical Functional ROM: Unrestricted ROM Stability: No instability detected Sensory: Unimpaired Muscle strength & Tone: No palpable anomalies  Lumbar Spine Exam  Inspection: Well healed scar from previous spine surgery detected Alignment: Symmetrical Functional ROM: Unrestricted ROM      Stability: No instability detected Muscle strength & Tone: Functionally intact Sensory: Unimpaired Palpation: Non-tender       Provocative Tests: Lumbar Hyperextension and rotation test: Positive bilaterally for facet joint pain. Patrick's Maneuver: Negative                    Gait & Posture Assessment  Ambulation: Unassisted Gait: Relatively normal for age and body habitus Posture: WNL   Lower Extremity Exam    Side: Right lower extremity  Side: Left lower extremity  Inspection: No masses, redness, swelling, or asymmetry. No contractures  Inspection: No masses, redness, swelling, or asymmetry. 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  Visit Diagnosis: 1. Chronic neck pain   2. Chronic left shoulder pain (Secondary Area of Pain)   3. Chronic bilateral low back pain without sciatica [facility] Area of Pain)   4. Chronic pain syndrome   5. Long term current use of opiate analgesic   6. Pharmacologic therapy   7. Disorder of skeletal system   8. Problems influencing health status    Plan of Care  Initial treatment plan:  Please be advised that as per protocol, today's visit has been an evaluation only. We have not taken over the patient's controlled substance management.  Problem-specific plan: No problem-specific Assessment & Plan notes found for this encounter.  Ordered Lab-work, Procedure(s), Referral(s), & Consult(s): Orders Placed This Encounter  Procedures  . DG Cervical Spine With Flex & Extend  . DG Lumbar Spine Complete W/Bend  . DG Shoulder Left  . Compliance Drug Analysis, Ur  . Comp. Metabolic Panel (12)  . Magnesium  . Vitamin B12  . Sedimentation rate  . 25-Hydroxyvitamin D Lcms D2+D3  . C-reactive protein  . Ambulatory referral to Psychology   Pharmacotherapy: Medications ordered:  No orders of the defined types were placed in this encounter.  Medications administered during this visit: [name] had no medications administered during this visit.   Pharmacotherapy under consideration:  Opioid Analgesics: The patient was informed that there is no guarantee that he would be a candidate for opioid analgesics. The decision will be made following CDC guidelines. This decision will be based on the results of diagnostic studies, as well as Mr. Prout risk profile.  Membrane stabilizer: To be determined at a later time Muscle relaxant: To be determined at a later time NSAID: To be determined at  a later time Other  analgesic(s): To be determined at a later time   Interventional therapies under consideration: Mr. Matters was informed that there is no guarantee that he would be a candidate for interventional therapies. The decision will be based on the results of diagnostic studies, as well as Mr. Cocozza risk profile.  Possible procedure(s): diagnostic left sided cervical facet nerve block Diagnostic left suprascapular nerve block Possible left sided cervical facet RFA Possible left suprascapular  RFA Diagnostic bilateral lumbar facet nerve block Diagnostic bilateral lumbar facet RFA   Provider-requested follow-up: Return for 2nd Visit, w/ Dr. Dossie Arbour, after [facility] eval, xrays before return for second visit.  Future Appointments  Date Time Provider [place]  04/10/2018  8:30 AM Eustace Pen, LPN [facility]  [id number]  2:00 PM Jinny Sanders, MD [facility]    Primary Care Physician: Jinny Sanders, MD Location: [facility] Outpatient Pain Management Facility Note by:  Date: 04/01/2018; Time: 6:02 PM  Pain Score Disclaimer: We use the NRS-11 scale. This is a self-reported, subjective measurement of pain severity with only modest accuracy. It is used primarily to identify changes within a particular patient. It must be understood that outpatient pain scales are significantly less accurate that those used for research, where they can be applied under ideal controlled circumstances with minimal exposure to variables. In reality, the score is likely to be a combination of pain intensity and pain affect, where pain affect describes the degree of emotional arousal or changes in action readiness caused by the sensory experience of pain. Factors such as social and work situation, setting, emotional state, anxiety levels, expectation, and prior pain experience may influence pain perception and show large inter-individual differences that may also be affected by time variables.  Patient instructions  provided during this appointment: Patient Instructions   ____________________________________________________________________________________________  Appointment Policy Summary  It is our goal and responsibility to provide the medical community with assistance in the evaluation and management of patients with chronic pain. Unfortunately our resources are limited. Because we do not have an unlimited amount of time, or available appointments, we are required to closely monitor and manage their use. The following rules exist to maximize their use:  Patient's responsibilities: 1. Punctuality:  At what time should I arrive? You should be physically present in our office 30 minutes before your scheduled appointment. Your scheduled appointment is with your assigned healthcare provider. However, it takes 5-10 minutes to be "checked-in", and another 15 minutes for the nurses to do the admission. If you arrive to our office at the time you were given for your appointment, you will end up being at least 20-25 minutes late to your appointment with the provider. 2. Tardiness:  What happens if I arrive only a few minutes after my scheduled appointment time? You will need to reschedule your appointment. The cutoff is your appointment time. This is why it is so important that you arrive at least 30 minutes before that appointment. If you have an appointment scheduled for 10:00 AM and you arrive at 10:01, you will be required to reschedule your appointment.  3. Plan ahead:  Always assume that you will encounter traffic on your way in. Plan for it. If you are dependent on a driver, make sure they understand these rules and the need to arrive early. 4. Other appointments and responsibilities:  Avoid scheduling any other appointments before or after your pain clinic appointments.  5. Be prepared:  Write down everything that you need  to discuss with your healthcare provider and give this information to the admitting  nurse. Write down the medications that you will need refilled. Bring your pills and bottles (even the empty ones), to all of your appointments, except for those where a procedure is scheduled. 6. No children or pets:  Find someone to take care of them. It is not appropriate to bring them in. 7. Scheduling changes:  We request "advanced notification" of any changes or cancellations. 8. Advanced notification:  Defined as a time period of more than 24 hours prior to the originally scheduled appointment. This allows for the appointment to be offered to other patients. 9. Rescheduling:  When a visit is rescheduled, it will require the cancellation of the original appointment. For this reason they both fall within the category of "Cancellations".  10. Cancellations:  They require advanced notification. Any cancellation less than 24 hours before the  appointment will be recorded as a "No Show". 11. No Show:  Defined as an unkept appointment where the patient failed to notify or declare to the practice their intention or inability to keep the appointment.  Corrective process for repeat offenders:  1. Tardiness: Three (3) episodes of rescheduling due to late arrivals will be recorded as one (1) "No Show". 2. Cancellation or reschedule: Three (3) cancellations or rescheduling will be recorded as one (1) "No Show". 3. "No Shows": Three (3) "No Shows" within a 12 month period will result in discharge from the practice. ____________________________________________________________________________________________  ____________________________________________________________________________________________  Pain Scale  Introduction: The pain score used by this practice is the Verbal Numerical Rating Scale (VNRS-11). This is an 11-point scale. It is for adults and children 10 years or older. There are significant differences in how the pain score is reported, used, and applied. Forget everything you learned  in the past and learn this scoring system.  General Information: The scale should reflect your current level of pain. Unless you are specifically asked for the level of your worst pain, or your average pain. If you are asked for one of these two, then it should be understood that it is over the past 24 hours.  Basic Activities of Daily Living (ADL): Personal hygiene, dressing, eating, transferring, and using restroom.  Instructions: Most patients tend to report their level of pain as a combination of two factors, their physical pain and their psychosocial pain. This last one is also known as "suffering" and it is reflection of how physical pain affects you socially and psychologically. From now on, report them separately. From this point on, when asked to report your pain level, report only your physical pain. Use the following table for reference.  Pain Clinic Pain Levels (0-5/10)  Pain Level Score  Description  No Pain 0   Mild pain 1 Nagging, annoying, but does not interfere with basic activities of daily living (ADL). Patients are able to eat, bathe, get dressed, toileting (being able to get on and off the toilet and perform personal hygiene functions), transfer (move in and out of bed or a chair without assistance), and maintain continence (able to control bladder and bowel functions). Blood pressure and heart rate are unaffected. A normal heart rate for a healthy adult ranges from 60 to 100 bpm (beats per minute).   Mild to moderate pain 2 Noticeable and distracting. Impossible to hide from other people. More frequent flare-ups. Still possible to adapt and function close to normal. It can be very annoying and may have occasional stronger flare-ups. With discipline,  patients may get used to it and adapt.   Moderate pain 3 Interferes significantly with activities of daily living (ADL). It becomes difficult to feed, bathe, get dressed, get on and off the toilet or to perform personal hygiene  functions. Difficult to get in and out of bed or a chair without assistance. Very distracting. With effort, it can be ignored when deeply involved in activities.   Moderately severe pain 4 Impossible to ignore for more than a few minutes. With effort, patients may still be able to manage work or participate in some social activities. Very difficult to concentrate. Signs of autonomic nervous system discharge are evident: dilated pupils (mydriasis); mild sweating (diaphoresis); sleep interference. Heart rate becomes elevated (>115 bpm). Diastolic blood pressure (lower number) rises above 100 mmHg. Patients find relief in laying down and not moving.   Severe pain 5 Intense and extremely unpleasant. Associated with frowning face and frequent crying. Pain overwhelms the senses.  Ability to do any activity or maintain social relationships becomes significantly limited. Conversation becomes difficult. Pacing back and forth is common, as getting into a comfortable position is nearly impossible. Pain wakes you up from deep sleep. Physical signs will be obvious: pupillary dilation; increased sweating; goosebumps; brisk reflexes; cold, clammy hands and feet; nausea, vomiting or dry heaves; loss of appetite; significant sleep disturbance with inability to fall asleep or to remain asleep. When persistent, significant weight loss is observed due to the complete loss of appetite and sleep deprivation.  Blood pressure and heart rate becomes significantly elevated. Caution: If elevated blood pressure triggers a pounding headache associated with blurred vision, then the patient should immediately seek attention at an urgent or emergency care unit, as these may be signs of an impending stroke.    Emergency Department Pain Levels (6-10/10)  Emergency Room Pain 6 Severely limiting. Requires emergency care and should not be seen or managed at an outpatient pain management facility. Communication becomes difficult and requires  great effort. Assistance to reach the emergency department may be required. Facial flushing and profuse sweating along with potentially dangerous increases in heart rate and blood pressure will be evident.   Distressing pain 7 Self-care is very difficult. Assistance is required to transport, or use restroom. Assistance to reach the emergency department will be required. Tasks requiring coordination, such as bathing and getting dressed become very difficult.   Disabling pain 8 Self-care is no longer possible. At this level, pain is disabling. The individual is unable to do even the most "basic" activities such as walking, eating, bathing, dressing, transferring to a bed, or toileting. Fine motor skills are lost. It is difficult to think clearly.   Incapacitating pain 9 Pain becomes incapacitating. Thought processing is no longer possible. Difficult to remember your own name. Control of movement and coordination are lost.   The worst pain imaginable 10 At this level, most patients pass out from pain. When this level is reached, collapse of the autonomic nervous system occurs, leading to a sudden drop in blood pressure and heart rate. This in turn results in a temporary and dramatic drop in blood flow to the brain, leading to a loss of consciousness. Fainting is one of the body's self defense mechanisms. Passing out puts the brain in a calmed state and causes it to shut down for a while, in order to begin the healing process.    Summary: 1. Refer to this scale when providing [place] with your pain level. 2. Be accurate and careful when reporting  your pain level. This will help with your care. 3. Over-reporting your pain level will lead to loss of credibility. 4. Even a level of 1/10 means that there is pain and will be treated at our facility. 5. High, inaccurate reporting will be documented as "Symptom Exaggeration", leading to loss of credibility and suspicions of possible secondary gains such as obtaining  more narcotics, or wanting to appear disabled, for fraudulent reasons. 6. Only pain levels of 5 or below will be seen at our facility. 7. Pain levels of 6 and above will be sent to the Emergency Department and the appointment cancelled. ____________________________________________________________________________________________

## 2018-04-02 ENCOUNTER — Ambulatory Visit
Admission: RE | Admit: 2018-04-02 | Discharge: 2018-04-02 | Disposition: A | Discharge status: Home / Self Care | Payer: BLUE CROSS/BLUE SHIELD | Source: Ambulatory Visit | Attending: Nurse Practitioner | Admitting: Nurse Practitioner

## 2018-04-02 DIAGNOSIS — M25512 Pain in left shoulder: Secondary | ICD-10-CM | POA: Diagnosis not present

## 2018-04-02 DIAGNOSIS — M545 Low back pain: Secondary | ICD-10-CM | POA: Insufficient documentation

## 2018-04-02 DIAGNOSIS — Z981 Arthrodesis status: Secondary | ICD-10-CM | POA: Insufficient documentation

## 2018-04-02 DIAGNOSIS — I700 Atherosclerosis of aorta: Secondary | ICD-10-CM | POA: Diagnosis not present

## 2018-04-02 DIAGNOSIS — G8929 Other chronic pain: Secondary | ICD-10-CM | POA: Insufficient documentation

## 2018-04-02 DIAGNOSIS — M4322 Fusion of spine, cervical region: Secondary | ICD-10-CM | POA: Diagnosis not present

## 2018-04-02 DIAGNOSIS — M542 Cervicalgia: Principal | ICD-10-CM

## 2018-04-02 DIAGNOSIS — M4326 Fusion of spine, lumbar region: Secondary | ICD-10-CM | POA: Diagnosis not present

## 2018-04-06 LAB — 25-HYDROXY VITAMIN D LCMS D2+D3
25-Hydroxy, Vitamin D-2: <1 ng/mL
25-Hydroxy, Vitamin D-3: 38 ng/mL
25-Hydroxy, Vitamin D: 39 ng/mL

## 2018-04-06 LAB — COMP. METABOLIC PANEL (12)
AST: 20 IU/L (ref 0–40)
Albumin/Globulin Ratio: 1.5 (ref 1.2–2.2)
Albumin: 4.6 g/dL (ref 3.6–4.8)
Alkaline Phosphatase: 58 IU/L (ref 39–117)
BUN/Creatinine Ratio: 8 — ABNORMAL LOW (ref 10–24)
BUN: 6 mg/dL — ABNORMAL LOW (ref 8–27)
Bilirubin Total: 0.3 mg/dL (ref 0.0–1.2)
Calcium: 9.5 mg/dL (ref 8.6–10.2)
Chloride: 101 mmol/L (ref 96–106)
Creatinine, Ser: 0.78 mg/dL (ref 0.76–1.27)
GFR calc Af Amer: 110 mL/min/{1.73_m2} (ref >59)
GFR calc non Af Amer: 95 mL/min/{1.73_m2} (ref >59)
Globulin, Total: 3 g/dL (ref 1.5–4.5)
Glucose: 90 mg/dL (ref 65–99)
Potassium: 5 mmol/L (ref 3.5–5.2)
Sodium: 139 mmol/L (ref 134–144)
Total Protein: 7.6 g/dL (ref 6.0–8.5)

## 2018-04-06 LAB — C-REACTIVE PROTEIN: CRP: 2.1 mg/L (ref 0.0–4.9)

## 2018-04-06 LAB — SEDIMENTATION RATE: Sed Rate: 11 mm/hr (ref 0–30)

## 2018-04-06 LAB — VITAMIN B12: Vitamin B-12: 393 pg/mL (ref 232–1245)

## 2018-04-06 LAB — MAGNESIUM: Magnesium: 2.1 mg/dL (ref 1.6–2.3)

## 2018-04-06 NOTE — Progress Notes (Signed)
Results were reviewed and found to be: mildly abnormal  No acute injury or pathology identified  Review would suggest interventional pain management techniques may be of benefit 

## 2018-04-07 LAB — COMPLIANCE DRUG ANALYSIS, UR

## 2018-04-10 ENCOUNTER — Ambulatory Visit (INDEPENDENT_AMBULATORY_CARE_PROVIDER_SITE_OTHER): Payer: BLUE CROSS/BLUE SHIELD

## 2018-04-10 VITALS — BP 118/76 | HR 54 | Temp 97.9°F | Ht 70.5 in | Wt 168.5 lb

## 2018-04-10 DIAGNOSIS — Z0000 Encounter for general adult medical examination without abnormal findings: Secondary | ICD-10-CM | POA: Diagnosis not present

## 2018-04-10 DIAGNOSIS — Z125 Encounter for screening for malignant neoplasm of prostate: Secondary | ICD-10-CM | POA: Diagnosis not present

## 2018-04-10 DIAGNOSIS — E785 Hyperlipidemia, unspecified: Secondary | ICD-10-CM

## 2018-04-10 LAB — CBC WITH DIFFERENTIAL/PLATELET
Basophils Absolute: 0 10*3/uL (ref 0.0–0.1)
Basophils Relative: 0.4 % (ref 0.0–3.0)
Eosinophils Absolute: 0.2 10*3/uL (ref 0.0–0.7)
Eosinophils Relative: 3.1 % (ref 0.0–5.0)
HCT: 37.4 % — ABNORMAL LOW (ref 39.0–52.0)
Hemoglobin: 12.7 g/dL — ABNORMAL LOW (ref 13.0–17.0)
Lymphocytes Relative: 35.8 % (ref 12.0–46.0)
Lymphs Abs: 2.3 10*3/uL (ref 0.7–4.0)
MCHC: 34.1 g/dL (ref 30.0–36.0)
MCV: 87.9 fl (ref 78.0–100.0)
Monocytes Absolute: 0.7 10*3/uL (ref 0.1–1.0)
Monocytes Relative: 10.4 % (ref 3.0–12.0)
Neutro Abs: 3.2 10*3/uL (ref 1.4–7.7)
Neutrophils Relative %: 50.3 % (ref 43.0–77.0)
Platelets: 294 10*3/uL (ref 150.0–400.0)
RBC: 4.25 Mil/uL (ref 4.22–5.81)
RDW: 15.2 % (ref 11.5–15.5)
WBC: 6.4 10*3/uL (ref 4.0–10.5)

## 2018-04-10 LAB — PSA, MEDICARE: PSA: 0.8 ng/ml (ref 0.10–4.00)

## 2018-04-10 LAB — TSH: TSH: 1.22 u[IU]/mL (ref 0.35–4.50)

## 2018-04-10 LAB — LIPID PANEL
Cholesterol: 146 mg/dL (ref 0–200)
HDL: 41.2 mg/dL (ref >39.00)
LDL Cholesterol: 73 mg/dL (ref 0–99)
NonHDL: 104.89
Total CHOL/HDL Ratio: 4
Triglycerides: 158 mg/dL — ABNORMAL HIGH (ref 0.0–149.0)
VLDL: 31.6 mg/dL (ref 0.0–40.0)

## 2018-04-10 NOTE — Progress Notes (Signed)
Subjective:   Donald Roberts is a 67 y.o. male who presents for Medicare Annual/Subsequent preventive examination.  Review of Systems:  N/A Cardiac Risk Factors include: advanced age (>79men, >17 women);male gender;smoking/ tobacco exposure;dyslipidemia     Objective:    Vitals: BP 118/76 (BP Location: Right Arm, Patient Position: Sitting, Cuff Size: Normal)   Pulse (!) 54   Temp 97.9 F (36.6 C)   Ht 5' 10.5" (1.791 m) Comment: shoes  Wt 168 lb 8 oz (76.4 kg)   SpO2 98%   BMI 23.84 kg/m   Body mass index is 23.84 kg/m.  Advanced Directives 04/10/2018 04/17/2017 04/03/2017 12/30/2016 03/14/2015 09/10/2013 09/08/2013  Does Patient Have a Medical Advance Directive? No No No No No Patient has advance directive, copy not in chart Patient has advance directive, copy not in chart  Type of Advance Directive - - - - - Living will Living will  Copy of Healthcare Power of Attorney in Chart? - - - - - - Copy requested from family  Would patient like information on creating a medical advance directive? No - Patient declined - - - No - patient declined information - -    Tobacco Social History   Tobacco Use  Smoking Status Former Smoker  . Packs/day: 1.00  . Years: 40.00  . Pack years: 40.00  . Types: E-cigarettes  . Last attempt to quit: 12/23/2016  . Years since quitting: 1.2  Smokeless Tobacco Never Used  Tobacco Comment   currently uses e cigarettes      Counseling given: No Comment: currently uses e cigarettes    Clinical Intake:  Pre-visit preparation completed: Yes  Pain : 0-10 Pain Score: 6  Pain Location: Back     Nutritional Risks: None Diabetes: No  How often do you need to have someone help you when you read instructions, pamphlets, or other written materials from your doctor or pharmacy?: 1 - Never What is the last grade level you completed in school?: 12th grade  Interpreter Needed?: No  Comments: pt lives with spouse Information entered by :: LPinson,  LPN  Past Medical History:  Diagnosis Date  . Abdominal pain, generalized 07/18/2008  . ALLERGIC RHINITIS 07/18/2008  . Allergy   . Back pain, chronic 10/01/2011  . Carpal tunnel syndrome on left   . Chronic low back pain 12/25/2012  . COLONIC POLYPS, HX OF 07/18/2008  . CORONARY ARTERY DISEASE 07/18/2008  . Degenerative arthritis of right knee 10/01/2011  . DIVERTICULOSIS, COLON 07/18/2008  . Gross hematuria 07/18/2008  . Headache(784.0)    Hx: of Migraines as a child  . Heart murmur   . HYPERLIPIDEMIA 07/18/2008  . Hypertension   . TOBACCO ABUSE 04/06/2009   Past Surgical History:  Procedure Laterality Date  . ANTERIOR CERVICAL DECOMP/DISCECTOMY FUSION N/A 09/10/2013   Procedure: ANTERIOR CERVICAL DECOMPRESSION/DISCECTOMY FUSION CERVICAL FOUR-FIVE,CERVICAL FIVE-SIX WITH PLATING AND BONE GRAFT;  Surgeon: Karn Cassis, MD;  Location: [facility];  Service: Neurosurgery;  Laterality: N/A;  . ANTERIOR CERVICAL DECOMP/DISCECTOMY FUSION N/A 03/14/2015   Procedure: ANTERIOR CERVICAL DECOMPRESSION/DISCECTOMY FUSION  CERVICAL TWO-THREE;  Surgeon: Hilda Lias, MD;  Location: [facility];  Service: Neurosurgery;  Laterality: N/A;  . CARDIAC CATHETERIZATION    . COLONOSCOPY    . COLONOSCOPY W/ BIOPSIES AND POLYPECTOMY     Hx: of  . lumbar surgury     dr botero/ns; 3 level fusion per pt  . POLYPECTOMY    . s/p cervical disease x 2  Family History  Problem Relation Age of Onset  . Heart disease Brother   . Heart disease Mother   . Heart disease Other   . Colon cancer Neg Hx   . Colon polyps Neg Hx   . Esophageal cancer Neg Hx   . Rectal cancer Neg Hx   . Stomach cancer Neg Hx    Social History   Socioeconomic History  . Marital status: Married    Spouse name: Not on file  . Number of children: Not on file  . Years of education: Not on file  . Highest education level: Not on file  Occupational History  . Occupation: Surveyor, minerals and Midwife  Social Needs  .  Financial resource strain: Not on file  . Food insecurity:    Worry: Not on file    Inability: Not on file  . Transportation needs:    Medical: Not on file    Non-medical: Not on file  Tobacco Use  . Smoking status: Former Smoker    Packs/day: 1.00    Years: 40.00    Pack years: 40.00    Types: E-cigarettes    Last attempt to quit: 12/23/2016    Years since quitting: 1.2  . Smokeless tobacco: Never Used  . Tobacco comment: currently uses e cigarettes   Substance and Sexual Activity  . Alcohol use: Yes    Comment: occasional  . Drug use: No  . Sexual activity: Not Currently  Lifestyle  . Physical activity:    Days per week: Not on file    Minutes per session: Not on file  . Stress: Not on file  Relationships  . Social connections:    Talks on phone: Not on file    Gets together: Not on file    Attends religious service: Not on file    Active member of club or organization: Not on file    Attends meetings of clubs or organizations: Not on file    Relationship status: Not on file  Other Topics Concern  . Not on file  Social History Narrative   Married, 1 daughter lives close by.   Spends time at [employer] in [place].    Outpatient Encounter Medications as of 04/10/2018  Medication Sig  . aspirin EC 81 MG tablet Take 81 mg by mouth daily.   . diazepam (VALIUM) 5 MG tablet Take 5 mg by mouth as needed for anxiety.  . famotidine-calcium carbonate-magnesium hydroxide (PEPCID COMPLETE) [phone] MG CHEW chewable tablet Chew 1 tablet by mouth daily.   . nitroGLYCERIN (NITROSTAT) 0.4 MG SL tablet Place 0.4 mg under the tongue every 5 (five) minutes as needed for chest pain.  . pravastatin (PRAVACHOL) 40 MG tablet Take 40 mg by mouth daily.  . ramipril (ALTACE) 5 MG capsule Take 5 mg by mouth daily.  . traMADol (ULTRAM) 50 MG tablet Take 1 tablet (50 mg total) by mouth every 8 (eight) hours as needed.  . [DISCONTINUED] oxymorphone (OPANA) 10 MG tablet Take 20 mg by mouth every  8 (eight) hours as needed for pain.    Facility-Administered Encounter Medications as of 04/10/2018  Medication  . 0.9 %  sodium chloride infusion    Activities of Daily Living In your present state of health, do you have any difficulty performing the following activities: 04/10/2018  Hearing? N  Vision? N  Difficulty concentrating or making decisions? N  Walking or climbing stairs? Y  Dressing or bathing? N  Doing errands, shopping? N  Preparing  Food and eating ? N  Using the Toilet? N  In the past six months, have you accidently leaked urine? N  Do you have problems with loss of bowel control? N  Managing your Medications? N  Managing your Finances? N  Housekeeping or managing your Housekeeping? N  Some recent data might be hidden    Patient Care Team: Excell Seltzer, MD as PCP - General (Family Medicine) Odette Fraction, MD as Consulting Physician (Anesthesiology) Hilda Lias, MD as Consulting Physician (Neurosurgery)   Assessment:   This is a routine wellness examination for Gracen.   Hearing Screening             Right ear:   40 0 40  0    Left ear:   40 0 0  0      Visual Acuity Screening   Right eye Left eye Both eyes  Without correction: 20/20 20/40-1 20/20-1  With correction:      Exercise Activities and Dietary recommendations Current Exercise Habits: The patient has a physically strenous job, but has no regular exercise apart from work.(still does Holiday representative work part-time), Exercise limited by: None identified  Goals    . Patient Stated     Starting 04/10/2018, I will continue to take medications as prescribed.        Fall Risk Fall Risk  04/10/2018 04/01/2018 12/30/2016  Falls in the past year? No Yes No  Number falls in past yr: - 1 -  Injury with Fall? - No -  Comment - fell after breaking foot and had cast on his foot and walking on crutches with cast fell down stairts -    Depression  Screen PHQ 2/9 Scores 04/10/2018 12/30/2016  PHQ - 2 Score 0 0  PHQ- 9 Score 0 -    Cognitive Function MMSE - Mini Mental State Exam 04/10/2018 12/30/2016  Orientation to time 5 5  Orientation to Place 5 5  Registration 3 3  Attention/ Calculation 0 0  Recall 3 3  Language- name 2 objects 0 0  Language- repeat 1 1  Language- follow 3 step command 3 3  Language- read & follow direction 0 0  Write a sentence 0 0  Copy design 0 0  Total score 20 20     PLEASE NOTE: A Mini-Cog screen was completed. Maximum score is 20. A value of 0 denotes this part of Folstein MMSE was not completed or the patient failed this part of the Mini-Cog screening.   Mini-Cog Screening Orientation to Time - Max 5 pts Orientation to Place - Max 5 pts Registration - Max 3 pts Recall - Max 3 pts Language Repeat - Max 1 pts Language Follow 3 Step Command - Max 3 pts     Immunization History  Administered Date(s) Administered  . Td 02/10/2018    Screening Tests Health Maintenance  Topic Date Due  . INFLUENZA VACCINE  12/30/2025 (Originally 07/02/2018)  . PNA vac Low Risk Adult (1 of 2 - PCV13) 04/10/2049 (Originally 07/10/2017)  . COLONOSCOPY  04/17/2022  . TETANUS/TDAP  02/11/2028  . Hepatitis C Screening  Completed  . HIV Screening  Completed       Plan:     I have personally reviewed, addressed, and noted the following in the patient's chart:  A. Medical and social history B. Use of alcohol, tobacco or illicit drugs  C. Current medications and supplements D. Functional ability and status E.  Nutritional status F.  Physical activity G.  Advance directives H. List of other physicians I.  Hospitalizations, surgeries, and ER visits in previous 12 months J.  Vitals K. Screenings to include hearing, vision, cognitive, depression L. Referrals and appointments - none  In addition, I have reviewed and discussed with patient certain preventive protocols, quality metrics, and best practice  recommendations. A written personalized care plan for preventive services as well as general preventive health recommendations were provided to patient.  See attached scanned questionnaire for additional information.   Signed,   Randa Evens, MHA, BS, LPN Health Coach

## 2018-04-10 NOTE — Patient Instructions (Signed)
Mr. Klemann , Thank you for taking time to come for your Medicare Wellness Visit. I appreciate your ongoing commitment to your health goals. Please review the following plan we discussed and let me know if I can assist you in the future.   These are the goals we discussed: Goals    . Patient Stated     Starting 04/10/2018, I will continue to take medications as prescribed.        This is a list of the screening recommended for you and due dates:  Health Maintenance  Topic Date Due  . Flu Shot  12/30/2025*  . Pneumonia vaccines (1 of 2 - PCV13) 04/10/2049*  . Colon Cancer Screening  04/17/2022  . Tetanus Vaccine  02/11/2028  .  Hepatitis C: One time screening is recommended by Center for Disease Control  (CDC) for  adults born from [birth year] through 1964.   Completed  . HIV Screening  Completed  *Topic was postponed. The date shown is not the original due date.   Preventive Care for Adults  A healthy lifestyle and preventive care can promote health and wellness. Preventive health guidelines for adults include the following key practices.  . A routine yearly physical is a good way to check with your health care provider about your health and preventive screening. It is a chance to share any concerns and updates on your health and to receive a thorough exam.  . Visit your dentist for a routine exam and preventive care every 6 months. Brush your teeth twice a day and floss once a day. Good oral hygiene prevents tooth decay and gum disease.  . The frequency of eye exams is based on your age, health, family medical history, use  of contact lenses, and other factors. Follow your health care provider's recommendations for frequency of eye exams.  . Eat a healthy diet. Foods like vegetables, fruits, whole grains, low-fat dairy products, and lean protein foods contain the nutrients you need without too many calories. Decrease your intake of foods high in solid fats, added sugars, and salt. Eat the  right amount of calories for you. Get information about a proper diet from your health care provider, if necessary.  . Regular physical exercise is one of the most important things you can do for your health. Most adults should get at least 150 minutes of moderate-intensity exercise (any activity that increases your heart rate and causes you to sweat) each week. In addition, most adults need muscle-strengthening exercises on 2 or more days a week.  Silver Sneakers may be a benefit available to you. To determine eligibility, you may visit the website: www.silversneakers.com or contact program at 618-540-6104 Mon-Fri between 8AM-8PM.   . Maintain a healthy weight. The body mass index (BMI) is a screening tool to identify possible weight problems. It provides an estimate of body fat based on height and weight. Your health care provider can find your BMI and can help you achieve or maintain a healthy weight.   For adults 20 years and older: ? A BMI below 18.5 is considered underweight. ? A BMI of 18.5 to 24.9 is normal. ? A BMI of 25 to 29.9 is considered overweight. ? A BMI of 30 and above is considered obese.   . Maintain normal blood lipids and cholesterol levels by exercising and minimizing your intake of saturated fat. Eat a balanced diet with plenty of fruit and vegetables. Blood tests for lipids and cholesterol should begin at age 34 and be  repeated every 5 years. If your lipid or cholesterol levels are high, you are over 50, or you are at high risk for heart disease, you may need your cholesterol levels checked more frequently. Ongoing high lipid and cholesterol levels should be treated with medicines if diet and exercise are not working.  . If you smoke, find out from your health care provider how to quit. If you do not use tobacco, please do not start.  . If you choose to drink alcohol, please do not consume more than 2 drinks per day. One drink is considered to be 12 ounces (355 mL) of  beer, 5 ounces (148 mL) of wine, or 1.5 ounces (44 mL) of liquor.  . If you are 5-65 years old, ask your health care provider if you should take aspirin to prevent strokes.  . Use sunscreen. Apply sunscreen liberally and repeatedly throughout the day. You should seek shade when your shadow is shorter than you. Protect yourself by wearing long sleeves, pants, a wide-brimmed hat, and sunglasses year round, whenever you are outdoors.  . Once a month, do a whole body skin exam, using a mirror to look at the skin on your back. Tell your health care provider of new moles, moles that have irregular borders, moles that are larger than a pencil eraser, or moles that have changed in shape or color.

## 2018-04-10 NOTE — Progress Notes (Signed)
I reviewed health advisor's note, was available for consultation, and agree with documentation and plan.  

## 2018-04-10 NOTE — Progress Notes (Signed)
PCP notes:   Health maintenance:  PCV13 - pt declined  Abnormal screenings:   Hearing - failed  Hearing Screening             Right ear:   40 0 40  0    Left ear:   40 0 0  0     Patient concerns:   Chronic back pain - pt seeing pain mgmt specialist   Nurse concerns:  None  Next PCP appt:   04/17/18 @ 1400

## 2018-04-14 DIAGNOSIS — M5412 Radiculopathy, cervical region: Secondary | ICD-10-CM | POA: Diagnosis not present

## 2018-04-15 DIAGNOSIS — M79671 Pain in right foot: Secondary | ICD-10-CM | POA: Diagnosis not present

## 2018-04-15 DIAGNOSIS — M79672 Pain in left foot: Secondary | ICD-10-CM | POA: Diagnosis not present

## 2018-04-15 DIAGNOSIS — R2689 Other abnormalities of gait and mobility: Secondary | ICD-10-CM | POA: Diagnosis not present

## 2018-04-16 ENCOUNTER — Other Ambulatory Visit: Payer: Self-pay | Admitting: *Deleted

## 2018-04-16 DIAGNOSIS — S92354A Nondisplaced fracture of fifth metatarsal bone, right foot, initial encounter for closed fracture: Secondary | ICD-10-CM | POA: Diagnosis not present

## 2018-04-16 DIAGNOSIS — S92344A Nondisplaced fracture of fourth metatarsal bone, right foot, initial encounter for closed fracture: Secondary | ICD-10-CM | POA: Diagnosis not present

## 2018-04-17 ENCOUNTER — Other Ambulatory Visit: Payer: Self-pay

## 2018-04-17 ENCOUNTER — Encounter: Payer: Self-pay | Admitting: Family Medicine

## 2018-04-17 ENCOUNTER — Ambulatory Visit (INDEPENDENT_AMBULATORY_CARE_PROVIDER_SITE_OTHER): Payer: BLUE CROSS/BLUE SHIELD | Admitting: Family Medicine

## 2018-04-17 VITALS — BP 120/66 | HR 76 | Temp 98.3°F | Ht 70.5 in | Wt 173.0 lb

## 2018-04-17 DIAGNOSIS — M542 Cervicalgia: Secondary | ICD-10-CM

## 2018-04-17 DIAGNOSIS — I2510 Atherosclerotic heart disease of native coronary artery without angina pectoris: Secondary | ICD-10-CM

## 2018-04-17 DIAGNOSIS — E782 Mixed hyperlipidemia: Secondary | ICD-10-CM | POA: Diagnosis not present

## 2018-04-17 DIAGNOSIS — G8929 Other chronic pain: Secondary | ICD-10-CM | POA: Diagnosis not present

## 2018-04-17 DIAGNOSIS — G894 Chronic pain syndrome: Secondary | ICD-10-CM | POA: Diagnosis not present

## 2018-04-17 DIAGNOSIS — Z87891 Personal history of nicotine dependence: Secondary | ICD-10-CM | POA: Diagnosis not present

## 2018-04-17 MED ORDER — PREGABALIN 100 MG PO CAPS: 100.0000 mg | ORAL_CAPSULE | Freq: Two times a day (BID) | ORAL | 2 refills | Status: DC

## 2018-04-17 NOTE — Assessment & Plan Note (Addendum)
Back pain tolerable on lyrica and tramadol! Now off all narcotics!!!  Still some room for improvement.. Trial of lyrica 100 mg BID.

## 2018-04-17 NOTE — Assessment & Plan Note (Signed)
Currently asymptomatic 

## 2018-04-17 NOTE — Assessment & Plan Note (Signed)
In lung ca screening program. Consider aaa screen next year.

## 2018-04-17 NOTE — Progress Notes (Signed)
Subjective:    Patient ID: Donald Roberts, male    DOB: 1951/06/21, 67 y.o.   MRN: [id number]  HPI The patient presents for  complete physical and review of chronic health problems.   The patient saw Lu Duffel, LPN for medicare wellness. Note reviewed in detail and important notes copied below.  Health maintenance:  PCV13 - pt declined  Abnormal screenings:   Hearing - failed             Hearing Screening             Right ear:   40 0 40  0    Left ear:   40 0 0  0     He has hearing aides   04/17/18 today   Weaned off cigarettes with vaping, now off vaping.   Chronic pain: now followed at new pain center and new neurosurgeon.  Has CT neck pending.  He is now off opana... Pain better controlled on Lyrica  twice daily and tramadol . He is on oxycodone 10 mg prn... But able to use rarely.  Rarely using valium for insomnia and muscle spasm.. Using about 3 times a month.  Elevated Cholesterol:  LDL almost at goal < 70 on pravastatin Lab Results  Component Value Date   CHOL 146 04/10/2018   HDL 41.20 04/10/2018   LDLCALC 73 04/10/2018   TRIG 158.0 (H) 04/10/2018   CHOLHDL 4 04/10/2018  Using medications without problems: Muscle aches:  Diet compliance: moderate Exercise: walks daily if pain allows Other complaints: HX of CAD, cardiologist  Dr. Donnie Aho  On ASA, ACEI   Social History /Family History/Past Medical History reviewed in detail and updated in EMR if needed. Blood pressure 120/66, pulse 76, temperature 98.3 F (36.8 C), temperature source Oral, height 5' 10.5" (1.791 m), weight 173 lb (78.5 kg).  Review of Systems  Constitutional: Negative for fatigue and fever.  HENT: Negative for ear pain.   Eyes: Negative for pain.  Respiratory: Negative for cough and shortness of breath.   Cardiovascular: Negative for chest pain, palpitations and leg swelling.  Gastrointestinal: Negative for  abdominal pain.  Genitourinary: Negative for dysuria.  Musculoskeletal: Negative for arthralgias.  Neurological: Negative for syncope, light-headedness and headaches.  Psychiatric/Behavioral: Negative for dysphoric mood.       Objective:   Physical Exam  Constitutional: He appears well-developed and well-nourished.  Non-toxic appearance. He does not appear ill. No distress.  HENT:  Head: Normocephalic and atraumatic.  Right Ear: Hearing, tympanic membrane, external ear and ear canal normal.  Left Ear: Hearing, tympanic membrane, external ear and ear canal normal.  Nose: Nose normal.  Mouth/Throat: Uvula is midline, oropharynx is clear and moist and mucous membranes are normal.  Eyes: Pupils are equal, round, and reactive to light. Conjunctivae, EOM and lids are normal. Lids are everted and swept, no foreign bodies found.  Neck: Trachea normal, normal range of motion and phonation normal. Neck supple. Carotid bruit is not present. No thyroid mass and no thyromegaly present.  Cardiovascular: Normal rate, regular rhythm, S1 normal, S2 normal, intact distal pulses and normal pulses. Exam reveals no gallop.  No murmur heard. Pulmonary/Chest: Breath sounds normal. He has no wheezes. He has no rhonchi. He has no rales.  Abdominal: Soft. Normal appearance and bowel sounds are normal. There is no hepatosplenomegaly. There is no tenderness. There is no rebound, no guarding and no CVA tenderness. No hernia. Hernia confirmed negative in the right inguinal area and confirmed  negative in the left inguinal area.  Genitourinary: Prostate normal, testes normal and penis normal. Rectal exam shows no external hemorrhoid, no internal hemorrhoid, no fissure, no mass, no tenderness, anal tone normal and guaiac negative stool. Prostate is not enlarged and not tender. Right testis shows no mass and no tenderness. Left testis shows no mass and no tenderness. No paraphimosis or penile tenderness.  Musculoskeletal:        Cervical back: He exhibits decreased range of motion and tenderness.       Lumbar back: He exhibits decreased range of motion and tenderness.  Lymphadenopathy:    He has no cervical adenopathy.       Right: No inguinal adenopathy present.       Left: No inguinal adenopathy present.  Neurological: He is alert. He has normal strength and normal reflexes. No cranial nerve deficit or sensory deficit. Gait normal.  Skin: Skin is warm, dry and intact. No rash noted.  Psychiatric: He has a normal mood and affect. His speech is normal and behavior is normal. Judgment normal.          Assessment & Plan:  The patient's preventative maintenance and recommended screening tests for an annual wellness exam were reviewed in full today. Brought up to date unless services declined.  Counselled on the importance of diet, exercise, and its role in overall health and mortality. The patient's FH and SH was reviewed, including their home life, tobacco status, and drug and alcohol status.   Vaccines: refused flu. PNA and shingles vaccine Prostate Cancer Screen: Lab Results  Component Value Date   PSA 0.80 04/10/2018   PSA 0.68 12/30/2016   PSA 0.71 12/25/2012       Colon Cancer Screen:   04/2017      Smoking Status: former smoker, he is using the Ecig with 0 nicotine,  44 year history, quit 1/1/ 2018, in lung cancer screening program. ETOH/ drug use: none/none Hep C:  done HIV screen:  done Consider abd [place] for AAA given past smoker age 61+  next year.

## 2018-04-17 NOTE — Assessment & Plan Note (Signed)
Followoed by neurosurgery

## 2018-04-17 NOTE — Patient Instructions (Signed)
hearyt healthy diet.

## 2018-04-17 NOTE — Assessment & Plan Note (Signed)
Well controlled. Continue current medication. Encouraged exercise, weight loss, healthy eating habits.  

## 2018-04-21 ENCOUNTER — Encounter: Payer: Self-pay | Admitting: Family Medicine

## 2018-04-21 DIAGNOSIS — G629 Polyneuropathy, unspecified: Secondary | ICD-10-CM | POA: Diagnosis not present

## 2018-04-21 DIAGNOSIS — Z79899 Other long term (current) drug therapy: Secondary | ICD-10-CM | POA: Insufficient documentation

## 2018-04-21 DIAGNOSIS — G603 Idiopathic progressive neuropathy: Secondary | ICD-10-CM | POA: Diagnosis not present

## 2018-04-21 DIAGNOSIS — M961 Postlaminectomy syndrome, not elsewhere classified: Secondary | ICD-10-CM | POA: Insufficient documentation

## 2018-04-21 DIAGNOSIS — M4722 Other spondylosis with radiculopathy, cervical region: Secondary | ICD-10-CM | POA: Insufficient documentation

## 2018-04-21 DIAGNOSIS — M1711 Unilateral primary osteoarthritis, right knee: Secondary | ICD-10-CM | POA: Insufficient documentation

## 2018-04-21 DIAGNOSIS — M79671 Pain in right foot: Secondary | ICD-10-CM | POA: Diagnosis not present

## 2018-04-21 DIAGNOSIS — Z9889 Other specified postprocedural states: Secondary | ICD-10-CM | POA: Insufficient documentation

## 2018-04-21 DIAGNOSIS — M79672 Pain in left foot: Secondary | ICD-10-CM | POA: Diagnosis not present

## 2018-04-21 NOTE — Progress Notes (Signed)
Patient's Name: Donald Roberts  MRN: [id number]  Referring Provider: Jinny Sanders, MD  DOB: Jul 20, 1951  PCP: Jinny Sanders, MD  DOS: 04/22/2018  Note by: Gaspar Cola, MD  Service setting: Ambulatory outpatient  Specialty: Interventional Pain Management  Location: [facility] (AMB) Pain Management Facility    Patient type: Established   Primary Reason(s) for Visit: Encounter for evaluation before starting new chronic pain management plan of care (Level of risk: moderate) CC: Neck Pain  HPI  Donald Roberts is a 67 y.o. year old, male patient, who comes today for a follow-up evaluation to review the test results and decide on a treatment plan. He has Hyperlipidemia; CAD (coronary artery disease); Personal history of colonic polyps; Personal history of tobacco use, presenting hazards to health; Chronic cervical radiculopathy; Chronic neck pain (Primary Area of Pain) (Bilateral) (L>R); Chronic shoulder pain (Secondary Area of Pain) (Left); Chronic low back pain [facility] Area of Pain) (Bilateral); Chronic pain syndrome; Long term current use of opiate analgesic; Pharmacologic therapy; Disorder of skeletal system; Problems influencing health status; Stopped smoking with greater than 40 pack year history; Cervical spondylosis with radiculopathy; Osteoarthritis of knee (Right); History of cervical spinal surgery; Failed back surgical syndrome; Long term prescription benzodiazepine use; Spondylosis without myelopathy or radiculopathy, cervical region; Cervical facet syndrome (L); and Neurogenic pain on their problem list. His primarily concern today is the Neck Pain  Pain Assessment: Location: Left Neck Radiating: left shoulder, back pain will radiate down back of upper legs Onset: More than a month ago Duration: Chronic pain Quality: Sharp, Stabbing, Discomfort, Aching Severity: 8 /10 (subjective, self-reported pain score)  Note: Reported level is inconsistent with clinical observations. Clinically the patient  looks like a 3/10 A 3/10 is viewed as "Moderate" and described as significantly interfering with activities of daily living (ADL). It becomes difficult to feed, bathe, get dressed, get on and off the toilet or to perform personal hygiene functions. Difficult to get in and out of bed or a chair without assistance. Very distracting. With effort, it can be ignored when deeply involved in activities. Information on the proper use of the pain scale provided to the patient today. When using our objective Pain Scale, levels between 6 and 10/10 are said to belong in an emergency room, as it progressively worsens from a 6/10, described as severely limiting, requiring emergency care not usually available at an outpatient pain management facility. At a 6/10 level, communication becomes difficult and requires great effort. Assistance to reach the emergency department may be required. Facial flushing and profuse sweating along with potentially dangerous increases in heart rate and blood pressure will be evident. Effect on ADL: cant concentrate, sleep, turning Timing: Constant Modifying factors: medications BP: (!) 141/80  HR: 70  Donald Roberts comes in today for a follow-up visit after his initial evaluation on 04/01/2018. Today we went over the results of his tests. These were explained in "Layman's terms". During today's appointment we went over my diagnostic impression, as well as the proposed treatment plan.  According to the patient's primary area of pain is in his neck. He admits that this neck pain is related to an injury that he sustained in 2001 when a dropped board fell on his head. He admits that 6 months later he had his first surgery; a discectomy. He admits that he has had 4 additional surgeries last in May 2016. He admits that he has had interventional therapy in the past and that the first one was successful  however the proceeding ones were not. He admits that this was after his surgery. He does not feel that  physical therapy (water aerobics helped his lower back) was effective.   His second area of pain is in his left shoulder. He admits that this pain does come from his neck. He denies any previous surgery, interventional therapy or physical therapy. He denies any numbness tingling or weakness in his arm. He admits that he has had several nerve conduction studies (Dr. Shelly Coss office).  His third area of pain is in his lower back. He describes as being midline. He denies any pain going down the legs. He admits that he has had surgery (x1) on multiple disc in 2008. He did have one injection after surgery which was effective. He admits that he didn't had physical therapy which was not effective. He denies any recent images. Back surgery done for back and leg pain. Leg pain is gone.  Oxycodone ER 10 mg qid. Says that it does not work. Use to take opana. Taking pain medicine since 2016, non-stop.   He admits that he was supposed to have a spinal cord stimulator placed before being dismissed from previous pain clinic [facility] - Clydell Hakim, MD).  In considering the treatment plan options, Donald Roberts was reminded that I no longer take patients for medication management only. I asked him to let me know if he had no intention of taking advantage of the interventional therapies, so that we could make arrangements to provide this space to someone interested. I also made it clear that undergoing interventional therapies for the purpose of getting pain medications is very inappropriate on the part of a patient, and it will not be tolerated in this practice. This type of behavior would suggest true addiction and therefore it requires referral to an addiction specialist.   Further details on both, my assessment(s), as well as the proposed treatment plan, please see below.  Controlled Substance Pharmacotherapy Assessment REMS (Risk Evaluation and Mitigation Strategy)  Analgesic: oxycodone 10 mg 4 times  daily (fill date 03/25/2018) oxycodone 40 mg per day (Per PMP) Highest recorded MME/day: 360 mg/day MME/day: 60 mg/day Pill Count: None expected due to no prior prescriptions written by our practice. No notes on file Pharmacokinetics: Liberation and absorption (onset of action): WNL Distribution (time to peak effect): WNL Metabolism and excretion (duration of action): WNL         Pharmacodynamics: Desired effects: Analgesia: Donald Roberts reports >50% benefit. Functional ability: Patient reports that medication allows him to accomplish basic ADLs Clinically meaningful improvement in function (CMIF): Sustained CMIF goals met Perceived effectiveness: Described as relatively effective, allowing for increase in activities of daily living (ADL) Undesirable effects: Side-effects or Adverse reactions: None reported Monitoring: [place] PMP: Online review of the past 63-monthperiod previously conducted. Not applicable at this point since we have not taken over the patient's medication management yet. List of other Serum/Urine Drug Screening Test(s):  No results found. List of all UDS test(s) done:  Lab Results  Component Value Date   SUMMARY FINAL 04/01/2018   Last UDS on record: Summary  Date Value Ref Range Status  04/01/2018 FINAL  Final    Comment:    ==================================================================== TOXASSURE COMP DRUG ANALYSIS,UR ==================================================================== Test                             Result       Flag  Units Drug Present and Declared for Prescription Verification   Desmethyldiazepam              313          EXPECTED   ng/mg creat   Oxazepam                       1000         EXPECTED   ng/mg creat   Temazepam                      461          EXPECTED   ng/mg creat    Desmethyldiazepam, oxazepam, and temazepam are expected    metabolites of diazepam. Desmethyldiazepam and oxazepam are also    expected metabolites of  other drugs, including chlordiazepoxide,    prazepam, clorazepate, and halazepam. Oxazepam is an expected    metabolite of temazepam. Oxazepam and temazepam are also    available as scheduled prescription medications.   Oxymorphone                    11761        EXPECTED   ng/mg creat   Noroxymorphone                 259          EXPECTED   ng/mg creat    Oxymorphone may be administered as a scheduled prescription    medication and is also an expected metabolite of oxycodone.    Noroxymorphone is an expected metabolite of oxycodone and/or    oxymorphone. Oxycodone and/or its metabolites are present in this    specimen.   Tramadol                       5426         EXPECTED   ng/mg creat   O-Desmethyltramadol            >10870       EXPECTED   ng/mg creat   N-Desmethyltramadol            1435         EXPECTED   ng/mg creat    Source of tramadol is a prescription medication.    O-desmethyltramadol and N-desmethyltramadol are expected    metabolites of tramadol. Drug Present not Declared for Prescription Verification   Noroxycodone                   365          UNEXPECTED ng/mg creat    Noroxycodone is an expected metabolite of oxycodone. Sources of    oxycodone include scheduled prescription medications.   Acetaminophen                  PRESENT      UNEXPECTED Drug Absent but Declared for Prescription Verification   Salicylate                     Not Detected UNEXPECTED    Aspirin, as indicated in the declared medication list, is not    always detected even when used as directed. ==================================================================== Test                      Result    Flag   Units      Ref Range   Creatinine  46               mg/dL      >=20 ==================================================================== Declared Medications:  The flagging and interpretation on this report are based on the  following declared medications.  Unexpected results may arise  from  inaccuracies in the declared medications.  **Note: The testing scope of this panel includes these medications:  Diazepam (Valium)  Oxymorphone (Opana)  Tramadol (Ultram)  **Note: The testing scope of this panel does not include small to  moderate amounts of these reported medications:  Aspirin (Aspirin 81)  **Note: The testing scope of this panel does not include following  reported medications:  Famotidine (Pepcid)  Nitroglycerin (Nitrostat)  Pravastatin (Pravachol)  Ramipril (Altace) ==================================================================== For clinical consultation, please call [phone]. ====================================================================    UDS interpretation: Unexpected findings: Patient informed of the CDC guidelines and recommendations to stay away from the concomitant use of benzodiazepines and opioids due to the increased risk of respiratory depression and death. Medication Assessment Form: Patient introduced to form today Treatment compliance: Treatment may start today if patient agrees with proposed plan. Evaluation of compliance is not applicable at this point Risk Assessment Profile: Aberrant behavior: See initial evaluations. None observed or detected today Comorbid factors increasing risk of overdose: Benzodiazepine use, concomitant use of Benzodiazepines, male gender and nicotine dependence Medical Psychology Evaluation: Please see scanned results in medical record. Opioid Risk Tool - 04/22/18 1118      Personal History of Substance Abuse   Alcohol  Negative    Illegal Drugs  Negative    Rx Drugs  Negative      Psychological Disease   Psychological Disease  Negative    Depression  Negative      Total Score   Opioid Risk Tool Scoring  0    Opioid Risk Interpretation  Low Risk      ORT Scoring interpretation table:  Score <3 = Low Risk for SUD  Score between 4-7 = Moderate Risk for SUD  Score >8 = High Risk for Opioid  Abuse   Risk Mitigation Strategies:  Patient opioid safety counseling: Completed today. Counseling provided to patient as per "Patient Counseling Document". Document signed by patient, attesting to counseling and understanding Patient-Prescriber Agreement (PPA): Obtained today.  Controlled substance notification to other providers: Written and sent today.  Pharmacologic Plan: Today we may be taking over the patient's pharmacological regimen. See below.             Laboratory Chemistry  Inflammation Markers (CRP: Acute Phase) (ESR: Chronic Phase) Lab Results  Component Value Date   CRP 2.1 04/01/2018   ESRSEDRATE 11 04/01/2018                         Renal Function Markers Lab Results  Component Value Date   BUN 6 (L) 04/01/2018   CREATININE 0.78 04/01/2018   BCR 8 (L) 04/01/2018   GFRAA 110 04/01/2018   GFRNONAA 95 04/01/2018  NOTE: BUN/Cr ratio <10:1 would suggests renal damage.   Hepatic Function Markers Lab Results  Component Value Date   AST 20 04/01/2018   ALT 10 12/30/2016   ALBUMIN 4.6 04/01/2018   ALKPHOS 58 04/01/2018   HCVAB NEGATIVE 12/30/2016                        Electrolytes Lab Results  Component Value Date   NA 139 04/01/2018   K 5.0 04/01/2018  CL 101 04/01/2018   CALCIUM 9.5 04/01/2018   MG 2.1 04/01/2018                        Neuropathy Markers Lab Results  Component Value Date   IHWTUUEK80 034 04/01/2018   HIV NONREACTIVE 12/30/2016                        Bone Pathology Markers Lab Results  Component Value Date   25OHVITD1 39 04/01/2018   25OHVITD2 <1.0 04/01/2018   25OHVITD3 38 04/01/2018                         Coagulation Parameters Lab Results  Component Value Date   INR 1.0 ratio 04/06/2009   LABPROT 11.3 04/06/2009   APTT 28.1 04/06/2009   PLT 294.0 04/10/2018                        Cardiovascular Markers Lab Results  Component Value Date   HGB 12.7 (L) 04/10/2018   HCT 37.4 (L) 04/10/2018                           Note: Lab results reviewed.  Recent Diagnostic Imaging Review  Cervical Imaging: Cervical MR wo contrast:  Results for orders placed during the hospital encounter of 06/26/13  MR Cervical Spine Wo Contrast   Narrative *RADIOLOGY REPORT*  Clinical Data: Neck pain.  Bilateral arm pain.  Duration of symptoms 5 weeks.  Previous fusion.  MRI CERVICAL SPINE WITHOUT CONTRAST  Technique:  Multiplanar and multiecho pulse sequences of the cervical spine, to include the craniocervical junction and cervicothoracic junction, were obtained according to standard protocol without intravenous contrast.  Comparison: Myelography 04/04/2008  Findings: The foramen magnum is widely patent.  There is mild osteoarthritis at the C1-2 articulation.  No encroachment upon the neural spaces.  C2-3:  No disc pathology.  There is mild facet degeneration on the left.  There is mild foraminal narrowing on the left without definite neural compression.  C3-4:  Spondylosis with endplate osteophytes and shallow broad- based herniation of disc material.  Effacement of the ventral subarachnoid space but no compression of the cord.  Mild foraminal narrowing bilaterally.  C4-5:  Spondylosis with endplate osteophytes and bulging of the disc.  Uncovertebral degeneration on the right.  Foraminal stenosis on the right probably due to a combination of osteophyte and disc material.  This would likely effect the right C5 nerve root. Narrowing of ventral subarachnoid space but no compression of the cord.  C5-6:  Spondylosis with endplate osteophytes.  Narrowing of the canal.  Cord atrophy and gliosis at this level without evidence of ongoing cord compression.  Foraminal stenosis bilaterally because of osteophytic encroachment could effect either or both C6 nerve roots.  C6-7:  Previous anterior cervical discectomy and fusion.  Wide patency of the canal and foramina.  C7-T1:  Mild facet degeneration.  Minimal  uncovertebral prominence bilaterally.  No compressive stenosis.  IMPRESSION: Good appearance of the fusion level of C6-7.  Spondylosis at C5-6, probably with the previous surgical discectomy.  Osteophytic encroachment upon the canal.  The cord shows atrophy and gliosis at this level presumably related to the previous compressive pathology.  No apparent ongoing compression at this level related to the cord.  There is neural foraminal stenosis bilaterally that could effect either C6  nerve root.  C2-3:  Facet arthropathy on the left.  Foraminal narrowing on the left could effect the C3 nerve root.  The joint could be painful.  C3-4:  Spondylosis with broad-based disc herniation.  Effacement of the ventral subarachnoid space without frank compression of the cord.  Mild foraminal narrowing bilaterally.  C4-5:  Foraminal stenosis on the right probably due to osteophyte and disc material.  C5 nerve root compression is likely.   Original Report Authenticated By: Nelson Chimes, M.D.    Cervical CT wo contrast:  Results for orders placed during the hospital encounter of 03/14/15  CT Cervical Spine Wo Contrast   Narrative CLINICAL DATA:  Intervertebral disc displacement. Cervical fusion. LEFT-sided neck and shoulder pain for 2 years.  EXAM: CT CERVICAL SPINE WITHOUT CONTRAST  TECHNIQUE: Multidetector CT imaging of the cervical spine was performed without intravenous contrast. Multiplanar CT image reconstructions were also generated.  COMPARISON:  CT myelogram 10/06/2013.  FINDINGS: Alignment: Trace anterolisthesis of C2 on C3 associated with facet arthrosis. Mild dextroconvex torticollis.  Craniocervical junction: The odontoid is intact. Occipital condyles intact. C1 ring intact. Atlantodental degenerative disease.  Vertebrae: No aggressive osseous lesions. Negative for fracture. C4 through C7 ACDF.  Paraspinal soft tissues: Carotid atherosclerosis.  Lung apices:  Emphysema.  C2-C3: Central canal appears patent. Severe LEFT facet arthrosis with LEFT foraminal stenosis. Marked overgrowth of the LEFT facet joint with both anterior and posterior osteophytes. RIGHT facet joint appears normal. Central canal and RIGHT foramen patent.  C3-C4: Mild to moderate central stenosis. Calcified broad-based disc protrusion. Short pedicles are present with bilateral right-greater-than-left uncovertebral spurring and mild RIGHT foraminal stenosis.  C4-C5: Pseudoarthrosis. The C4 ACDF screws are loose and abut the superior aspect of the C4-C5 cage graft. This may represent subsidence into the inferior C4 endplate or mechanical loosening. Bilateral foraminal stenosis due to uncovertebral spurring and short pedicles.  C5-C6: C5 ACDF screws show good purchase without evidence of loosening. Despite the intact hardware, there is no bridging bone across C5-C6 compatible with a delayed union. No significant change from prior CT myelogram 2014. Findings are consistent with ongoing motion. There has been no bridging bone development and only a small amount of incorporation of the interbody cage graft which is placed eccentrically to the LEFT. Left-greater-than-right uncovertebral spurring.  C6-C7: Partial incorporation of interbody bone graft that appears similar to the prior exam. C6-C7 ACDF. There may be a tiny amount of bridging bone centrally however the amount of bridging bone is less than expected when comparing to the prior CT myelogram.  C7-T1: Disc degeneration. Central canal appears patent.  IMPRESSION: 1. Disc contiguous fusion plates from C4 through C6 and at C6-C7. 2. C4-C5 pseudoarthrosis. 3. Delayed union at C5-C6.  No hardware loosening. 4. C6-C7 persistent nonunion. Less than expected bridging bone at C6-C7 without significant interval change from CT myelogram from 2014.   Electronically Signed   By: Dereck Ligas M.D.   On: 03/14/2015  11:05    Cervical CT w contrast:  Results for orders placed during the hospital encounter of 10/06/13  CT Cervical Spine W Contrast   Narrative CLINICAL DATA:  Recent operation with new left upper extremity weakness. Evaluate recent cervical fusion.  TECHNIQUE: Contiguous axial images were obtained through the Cervical spine without infusion. Coronal and sagittal reconstructions were obtained of the axial image sets.  FLUOROSCOPY TIME:  1 min 2 seconds  PROCEDURE: LUMBAR PUNCTURE FOR CERVICAL MYELOGRAM  After thorough discussion of risks and benefits of the procedure  including bleeding, infection, injury to nerves, blood vessels, adjacent structures as well as headache and CSF leak, written and oral informed consent was obtained. Consent was obtained by Dr. Dereck Ligas. We discussed the high likelihood of obtaining a diagnostic study.  Patient was positioned prone on the fluoroscopy table. Local anesthesia was provided with 1% lidocaine without epinephrine after prepped and draped in the usual sterile fashion. Puncture was performed at L4-L5 using a 3 1/2 inch 22-gauge spinal needle via midline approach. Using a single pass through the dura, the needle was placed within the thecal sac, with return of clear CSF. 8 mL of Omnipaque-300 was injected into the thecal sac, with normal opacification of the nerve roots and cauda equina consistent with free flow within the subarachnoid space. The patient was then moved to the trendelenburg position and contrast flowed into the Cervical spine region.  I personally performed the lumbar puncture and administered the intrathecal contrast. I also personally supervised acquisition of the myelogram images.  FINDINGS: CERVICAL MYELOGRAM FINDINGS:  Cervical spinal alignment appears anatomic aside from trace anterolisthesis of C2 on C3. The prevertebral soft tissues are within normal limits. There is a new C4 through C7 ACDF  with discectomy at C4-C5 and C5-C6. Existing C6-C7 ACDF plate. Interbody bone graft is present at C4-C5 and C5-C6. There is a shallow anterior extradural impression at C3-C4 associated with disc bulging that appears unchanged from the prior MRI 06/26/2013. There is no pathologic motion identified with flexion and extension maneuvers. No hardware movement is seen. Due to the long segment fusion, flexion and extension range of motion are suboptimal.  CT CERVICAL MYELOGRAM FINDINGS:  There is a mild dextro convex torticollis at the time of CT scanning. Trace anterolisthesis of C2 on C3 remains present. 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 Atlantodental degenerative disease. Craniocervical alignment appears normal.  C2-C3: Severe left facet arthrosis with left foraminal stenosis potentially affecting the left C3 nerve. Right foramen and central canal patent. Left posterior facet spurring and overgrowth is present along with subchondral cystic changes in the inferior C2 articular process.  C3-C4: Broad-based central disc protrusion is present with mild central stenosis. AP diameter of the thecal sac is 9 mm. There is flattening of the ventral cervical cord. There is right greater than left mild bilateral foraminal stenosis potentially affecting the C4 nerves.  C4-C5: Discectomy with ACDF. The C4 ACDF screws are intact however the right-sided screw breaches the inferior cortex of the vertebral body of C4. Central canal has been decompressed through discectomy. 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 This is greater on the right than left. Small residual lateral osteophyte is present in the lateral anterior left foramen potentially affecting the left C5 nerve.  C5-C6: Discectomy with central canal  decompression. Mild residual disc osteophyte complex with mild central stenosis. Short pedicles are again noted. The cervical cord shows myelomalacia with thinning of the cord. Residual bilateral foraminal stenosis is present associated with uncovertebral spurring. The C5 and C6 pedicle screws have good position.  C6-C7: Unchanged C6-C7 ACDF plate. There is no bridging bone across the disk space compatible with delayed union. No convincing evidence of pseudoarthrosis. The central canal is decompressed. Residual/ recurrent bilateral foraminal stenosis, greater on the left than right associated with uncovertebral spurring.  C7-T1:  Central canal patent. Mild congenital foraminal stenosis.  IMPRESSION: 1. Technically successful lumbar  puncture for cervical myelogram. 2. Recent C4 through C6 ACDF. The C4 right pedicle screw breeches the inferior endplate of the C4 vertebra. Right-greater-than-left residual foraminal narrowing associated with short pedicles and uncovertebral spurring. Small lateral spur on the left at C4-C5. 3. C5-C6 residual bilateral foraminal stenosis associated with short pedicles uncovertebral spurring. C5-C6 cervical cord myelomalacia. Central canal is decompressed with mild residual osteophyte. 4. C6-C7 unchanged ACDF plate with delayed union across the disk space. No bridging bone is identified however there is no hardware complication at I9-J1. Bilateral foraminal stenosis due to uncovertebral spurring. 5. Unchanged C2-C3 and C3-C4 degenerative disease.   Electronically Signed   By: Dereck Ligas M.D.   On: 10/06/2013 09:18    Cervical DG 2-3 views:  Results for orders placed during the hospital encounter of 03/14/15  DG Cervical Spine 2-3 Views   Narrative CLINICAL DATA:  67 year old male for C2-3 fusion. Initial encounter.  EXAM: CERVICAL SPINE - 2-3 VIEW  COMPARISON:  03/14/2015.  FINDINGS: Two intraoperative C-arm views of the cervical spine  submitted for review after surgery.  Patient is post fusion C4 through C7 as on prior CT.  Inferior needle is directed superiorly projecting over the C2-3 disc space.  Superior needle is directed over the C3-4 disc space.  Patient is intubated with with nasogastric tube with pH probe in place. Surgical sponge in place.  IMPRESSION: Metallic probes directed to the C2-3 and C3-4 disc space as detailed above.   Electronically Signed   By: Genia Del M.D.   On: 03/14/2015 14:45    Cervical DG Bending/F/E views:  Results for orders placed during the hospital encounter of 04/02/18  DG Cervical Spine With Flex & Extend   Narrative CLINICAL DATA:  Chronic back pain, neck pain  EXAM: CERVICAL SPINE COMPLETE WITH FLEXION AND EXTENSION VIEWS  COMPARISON:  Lateral cervical spine film of 03/27/2015 and CT cervical spine of 03/14/2015  FINDINGS: Prior fusion at C4-5 6 and C6-7 appears stable with normal alignment. Anterior fusion is present at C2-3 with interbody fusion plug in good position. Normal alignment is maintained. No prevertebral soft tissue swelling is seen. On oblique views there is some foraminal narrowing at C4-5, C5-6, and C6-7 levels. The odontoid process is intact. The lung apices are clear.  Through flexion and extension there is slightly limited range of motion with no malalignment noted.  IMPRESSION: 1. Anterior fusion at C2-3 and C4-5-6 as well as C6-7 with normal alignment maintained. No complicating features. 2. Slightly limited range of motion through flexion and extension. 3. Some foraminal narrowing bilaterally is noted primarily at C4-5, C5-6 and C6-7 levels.   Electronically Signed   By: Ivar Drape M.D.   On: 04/02/2018 12:15    Cervical DG complete:  Results for orders placed in visit on 02/15/00  DG Cervical Spine Complete   Narrative FINDINGS CLINICAL DATA:  SILVER TRAUMA. TWO VIEW CHEST: TWO VIEWS OF THE CHEST COMPARED TO THE PRIOR  STUDY FROM 1992.  THE CARDIAC SILHOUETTE IS UPPER LIMITS OF NORMAL AND STABLE.  MEDIASTINAL AND HILAR CONTOURS ARE WITHIN NORMAL LIMITS AND UNCHANGED.  THE LUNGS ARE CLEAR.  NO EFFUSIONS ARE SEEN.  NO PNEUMOTHORAX.  THE BONY STRUCTURES ARE INTACT. IMPRESSION NO EVIDENCE OF ACUTE CARDIOPULMONARY PROCESS. PELVIS: THERE IS NO EVIDENCE OF FRACTURE OR DIASTASIS. NO OTHER SIGNIFICANT BONE OR SOFT TISSUE ABNORMALITIES ARE IDENTIFIED. IMPRESSION: NORMAL STUDY. CERVICAL SPINE SERIES: MULTIPLE VIEWS OF THE CERVICAL SPINE DEMONSTRATE GOOD ALIGNMENT ON THE LATERAL FILM.  THERE  IS SEVERE DEGENERATIVE CHANGES AT C5-6.  IT IS POSSIBLE THE PATIENT COULD HAVE HAD A PREVIOUS FUSION AT THIS LEVEL WITH AN INTERBODY BONE PLUG.  NO ACUTE BONY ABNORMALITY.  THE OBLIQUE FILMS DEMONSTRATE MILD FORAMINAL NARROWING AT ALL LEVELS.  THE FACET JOINTS APPEAR NORMAL.  OPEN-MOUTH ODONTOID SHOWS NORMAL DENS AND C1-C2 ARTICULATIONS. IMPRESSION: NO ACUTE BONY ABNORMALITY OF THE CERVICAL SPINE. SEVERE DEGENERATIVE DISC DISEASE C5-6 VERSUS INTERBODY FUSION.   Cervical DG Myelogram views:  Results for orders placed during the hospital encounter of 10/06/13  DG Myelogram Cervical   Narrative CLINICAL DATA:  Recent operation with new left upper extremity weakness. Evaluate recent cervical fusion.  TECHNIQUE: Contiguous axial images were obtained through the Cervical spine without infusion. Coronal and sagittal reconstructions were obtained of the axial image sets.  FLUOROSCOPY TIME:  1 min 2 seconds  PROCEDURE: LUMBAR PUNCTURE FOR CERVICAL MYELOGRAM  After thorough discussion of risks and benefits of the procedure including bleeding, infection, injury to nerves, blood vessels, adjacent structures as well as headache and CSF leak, written and oral informed consent was obtained. Consent was obtained by Dr. Dereck Ligas. We discussed the high likelihood of obtaining a diagnostic study.  Patient was positioned  prone on the fluoroscopy table. Local anesthesia was provided with 1% lidocaine without epinephrine after prepped and draped in the usual sterile fashion. Puncture was performed at L4-L5 using a 3 1/2 inch 22-gauge spinal needle via midline approach. Using a single pass through the dura, the needle was placed within the thecal sac, with return of clear CSF. 8 mL of Omnipaque-300 was injected into the thecal sac, with normal opacification of the nerve roots and cauda equina consistent with free flow within the subarachnoid space. The patient was then moved to the trendelenburg position and contrast flowed into the Cervical spine region.  I personally performed the lumbar puncture and administered the intrathecal contrast. I also personally supervised acquisition of the myelogram images.  FINDINGS: CERVICAL MYELOGRAM FINDINGS:  Cervical spinal alignment appears anatomic aside from trace anterolisthesis of C2 on C3. The prevertebral soft tissues are within normal limits. There is a new C4 through C7 ACDF with discectomy at C4-C5 and C5-C6. Existing C6-C7 ACDF plate. Interbody bone graft is present at C4-C5 and C5-C6. There is a shallow anterior extradural impression at C3-C4 associated with disc bulging that appears unchanged from the prior MRI 06/26/2013. There is no pathologic motion identified with flexion and extension maneuvers. No hardware movement is seen. Due to the long segment fusion, flexion and extension range of motion are suboptimal.  CT CERVICAL MYELOGRAM FINDINGS:  There is a mild dextro convex torticollis at the time of CT scanning. Trace anterolisthesis of C2 on C3 remains present. 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 Atlantodental degenerative disease. Craniocervical alignment appears normal.  C2-C3: Severe left facet arthrosis with left foraminal stenosis potentially  affecting the left C3 nerve. Right foramen and central canal patent. Left posterior facet spurring and overgrowth is present along with subchondral cystic changes in the inferior C2 articular process.  C3-C4: Broad-based central disc protrusion is present with mild central stenosis. AP diameter of the thecal sac is 9 mm. There is flattening of the ventral cervical cord. There is right greater than left mild bilateral foraminal stenosis potentially affecting the C4 nerves.  C4-C5: Discectomy with ACDF. The C4 ACDF screws are intact however the right-sided screw breaches the inferior cortex of the  vertebral body of C4. Central canal has been decompressed through discectomy. 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 This is greater on the right than left. Small residual lateral osteophyte is present in the lateral anterior left foramen potentially affecting the left C5 nerve.  C5-C6: Discectomy with central canal decompression. Mild residual disc osteophyte complex with mild central stenosis. Short pedicles are again noted. The cervical cord shows myelomalacia with thinning of the cord. Residual bilateral foraminal stenosis is present associated with uncovertebral spurring. The C5 and C6 pedicle screws have good position.  C6-C7: Unchanged C6-C7 ACDF plate. There is no bridging bone across the disk space compatible with delayed union. No convincing evidence of pseudoarthrosis. The central canal is decompressed. Residual/ recurrent bilateral foraminal stenosis, greater on the left than right associated with uncovertebral spurring.  C7-T1:  Central canal patent. Mild congenital foraminal stenosis.  IMPRESSION: 1. Technically successful lumbar puncture for cervical myelogram. 2. Recent C4 through C6 ACDF. The C4 right pedicle screw breeches the inferior endplate of the C4 vertebra.  Right-greater-than-left residual foraminal narrowing associated with short pedicles and uncovertebral spurring. Small lateral spur on the left at C4-C5. 3. C5-C6 residual bilateral foraminal stenosis associated with short pedicles uncovertebral spurring. C5-C6 cervical cord myelomalacia. Central canal is decompressed with mild residual osteophyte. 4. C6-C7 unchanged ACDF plate with delayed union across the disk space. No bridging bone is identified however there is no hardware complication at T0-G2. Bilateral foraminal stenosis due to uncovertebral spurring. 5. Unchanged C2-C3 and C3-C4 degenerative disease.   Electronically Signed   By: Dereck Ligas M.D.   On: 10/06/2013 09:18    Shoulder Imaging: Shoulder-R DG:  Results for orders placed during the hospital encounter of 12/25/12  DG Shoulder Right   Narrative *RADIOLOGY REPORT*  Clinical Data: Right shoulder pain  RIGHT SHOULDER - 2+ VIEW  Comparison: None.  Findings: No fracture dislocation of the right shoulder.  Mild spurring of the acromioclavicular joint.  IMPRESSION: No acute osseous abnormality.   Original Report Authenticated By: Suzy Bouchard, M.D.    Shoulder-L DG:  Results for orders placed during the hospital encounter of 04/02/18  DG Shoulder Left   Narrative CLINICAL DATA:  Chronic shoulder, neck and back pain  EXAM: LEFT SHOULDER - 2+ VIEW  COMPARISON:  Left shoulder films of 04/07/2005  FINDINGS: The left humeral head is in normal position and the glenohumeral joint space appears normal. There is slight widening of the left AC joint but no acute abnormality is seen. The left clavicle appears intact. The left ribs that are visualized are intact.  IMPRESSION: Negative left shoulder   Electronically Signed   By: Ivar Drape M.D.   On: 04/02/2018 12:19    Lumbosacral Imaging: Lumbar MR wo contrast:  Results for orders placed during the hospital encounter of 06/09/07  MR Lumbar  Spine Wo Contrast   Narrative Clinical Data: 67 year old male with low back pain radiating to left hip and leg for one year.  MRI LUMBAR SPINE WITHOUT CONTRAST:  Technique: Multiplanar and multiecho pulse sequences of the lumbar spine, to include the lower thoracic and upper sacral regions, were obtained according to standard protocol without IV contrast.  Comparison: 04/10/06.  Findings: Normal signal is present in the conus medullaris which terminates at L1 . Endplate marrow changes are more pronounced, particularly at L2-3 where type I Modic changes have progressed. There is also some progression of osteophyte formation at this level. Vertebral body  heights are maintained. Alignment is anatomic.   Small cyst at the lower pole of the left kidney is not significantly changed in the interval. Soft tissues are otherwise unremarkable. Individual disk levels are as follows:  T12-L1: Negative.  L1-2: Negative.  L2-3: There is progression of moderate biforaminal narrowing. Central canal stenosis similar to the prior exam.   L3-4: Moderate right and mild left foraminal narrowing is similar to the prior exam. Broad-based disk bulge and facet hypertrophy contribute.  L4-5: Broad-based disk bulge with central protrusion is redemonstrated. There is lateral recess narrowing, left greater than right. This may have progressed some in the interval. Mild to moderate biforaminal narrowing is stable.  L5-S1: Disk bulging is eccentric to the left. No significant stenosis.  IMPRESSION:  1. Progression of endplate marrow changes at L2-3.  2. Slight progression of biforaminal narrowing, L2-3.  3. Stable disk disease at L3-4.  4. Stable disk disease at L4-5, including central protrusion.  Provider: Sammie Bench   Lumbar CT w contrast:  Results for orders placed during the hospital encounter of 07/12/09  CT Lumbar Spine W Contrast   Narrative MYELOGRAM  INJECTION   Technique:  Informed consent was obtained from the  patient prior to the procedure, including potential complications of headache, allergy, infection and pain. Specific instructions were given regarding 24 hour bedrest post procedure to prevent post-LP headache.  A timeout procedure was performed.  With the patient prone, the lower back was prepped with Betadine.  1% Lidocaine was used for local anesthesia.  Lumbar puncture was performed by the radiologist at the L3 level using a 22 gauge needle with return of clear CSF.  15 cc of Omnipaque 180 was injected into the subarachnoid space .   IMPRESSION: Successful injection of  intrathecal contrast for myelography.   MYELOGRAM LUMBAR   Clinical Data:  Low back and bilateral leg pain.  Previous lumbar surgeries status post L2-L5 fusion.   Comparison: Most recent CT myelogram 04/04/2008.   Findings: Anatomic alignment.  Hardware intact although bilateral L5 screw loosening noted.  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  No dynamic instability.  No significant adjacent segment disease.  No nerve root cut off or spinal stenosis.   Fluoroscopy Time: 2.12 minutes   IMPRESSION: As above.   CT MYELOGRAPHY LUMBAR SPINE   Technique: Multidetector CT imaging of the lumbar spine was performed following myelography.  Multiplanar CT image reconstructions were also generated.   Findings:  No prevertebral or paraspinous masses.   L1-2: Normal.   L2-3: Satisfactory post fusion appearance.  Hardware intact.  Mild left neural foraminal narrowing due to bony overgrowth but no definite L2 compression.   L3-4: Solid fusion.  No neural compression.  Hardware intact and appropriately placed.   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  The interbody spacer is solidly incorporated into the L5 vertebral body but there is lucency across its superior aspect with nonunion with respect to the L4 vertebral body.  This was noted previously.   L5-S1: Mild facet arthropathy. Mild  annular bulging. No stenosis or disc protrusion. Mild neural foraminal narrowing appears non compressive.   Compared to the prior CT myelogram there is little change.   IMPRESSION: No significant compressive pathology at any level.   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  Dynamic standing flexion/extension views do not demonstrate any significant movement at the L4-5 level.   No significant adjacent segment disease at L1-2 or L5-S1.  Provider: Curtis Sites   Lumbar DG 2-3 views:  Results for orders placed during the hospital encounter of 06/24/07  DG Lumbar Spine 2-3 Views   Narrative History: L2-L5 laminectomies, PLIF   LUMBAR SPINE 2 VIEWS:   Two digital C-arm fluoroscopic images obtained intraoperatively. Preceding MRI lumbar spine of 06/09/2007 labeled with 5 lumbar vertebra.   Bilateral pedicle screws at L2, L3, L4, and L5.  Disc prostheses noted at L2-L3, L3-L4, and L4-L5 discs. No subluxation on lateral view. No fracture or bone destruction.   IMPRESSION: Intraoperative images during posterior fusion L2-L5.  Provider: Mila Palmer   Lumbar DG Bending views:  Results for orders placed during the hospital encounter of 04/02/18  DG Lumbar Spine Complete W/Bend   Narrative CLINICAL DATA:  Chronic neck and back pain  EXAM: LUMBAR SPINE - COMPLETE WITH BENDING VIEWS  COMPARISON:  CT lumbar spine of 07/12/2009  FINDINGS: Posterior hardware for fusion at L2-3, L3-4, and L4-5 levels remains. No complicating features are seen. Normal alignment is maintained. Interbody fusion plugs remain at L2-3, L3-4, and L4-5 levels. Through flexion and extension there is relatively normal range of motion with no malalignment noted. Moderate abdominal aortic atherosclerosis is present. Probable renovascular calcification is noted although small renal calculi cannot be excluded.  IMPRESSION: 1. Stable posterior fusion from L2-L5.  No complicating features. 2.  Relatively normal range of motion through flexion and extension. 3. Moderate abdominal aortic atherosclerosis.   Electronically Signed   By: Ivar Drape M.D.   On: 04/02/2018 12:17    Lumbar DG Myelogram views:  Results for orders placed during the hospital encounter of 07/12/09  DG Myelogram Lumbar   Narrative MYELOGRAM  INJECTION   Technique:  Informed consent was obtained from the patient prior to the procedure, including potential complications of headache, allergy, infection and pain. Specific instructions were given regarding 24 hour bedrest post procedure to prevent post-LP headache.  A timeout procedure was performed.  With the patient prone, the lower back was prepped with Betadine.  1% Lidocaine was used for local anesthesia.  Lumbar puncture was performed by the radiologist at the L3 level using a 22 gauge needle with return of clear CSF.  15 cc of Omnipaque 180 was injected into the subarachnoid space .   IMPRESSION: Successful injection of  intrathecal contrast for myelography.   MYELOGRAM LUMBAR   Clinical Data:  Low back and bilateral leg pain.  Previous lumbar surgeries status post L2-L5 fusion.   Comparison: Most recent CT myelogram 04/04/2008.   Findings: Anatomic alignment.  Hardware intact although bilateral L5 screw loosening noted.  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  No dynamic instability.  No significant adjacent segment disease.  No nerve root cut off or spinal stenosis.   Fluoroscopy Time: 2.12 minutes   IMPRESSION: As above.   CT MYELOGRAPHY LUMBAR SPINE   Technique: Multidetector CT imaging of the lumbar spine was performed following myelography.  Multiplanar CT image reconstructions were also generated.   Findings:  No prevertebral or paraspinous masses.   L1-2: Normal.   L2-3: Satisfactory post fusion appearance.  Hardware intact.  Mild left neural foraminal narrowing due to bony overgrowth but no definite L2 compression.   L3-4:  Solid fusion.  No neural compression.  Hardware intact and appropriately placed.   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  The interbody spacer is solidly incorporated into the L5 vertebral body but there is lucency across its superior aspect with nonunion with  respect to the L4 vertebral body.  This was noted previously.   L5-S1: Mild facet arthropathy. Mild annular bulging. No stenosis or disc protrusion. Mild neural foraminal narrowing appears non compressive.   Compared to the prior CT myelogram there is little change.   IMPRESSION: No significant compressive pathology at any level.   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  Dynamic standing flexion/extension views do not demonstrate any significant movement at the L4-5 level.   No significant adjacent segment disease at L1-2 or L5-S1.  Provider: Candis Schatz     Complexity Note: Imaging results reviewed. Results shared with Donald Roberts, using Layman's terms.                         Meds   Current Outpatient Medications:  .  aspirin EC 81 MG tablet, Take 81 mg by mouth daily. , Disp: , Rfl:  .  famotidine-calcium carbonate-magnesium hydroxide (PEPCID COMPLETE) 10-800-165 MG CHEW chewable tablet, Chew 1 tablet by mouth daily. , Disp: , Rfl:  .  nitroGLYCERIN (NITROSTAT) 0.4 MG SL tablet, Place 0.4 mg under the tongue every 5 (five) minutes as needed for chest pain., Disp: , Rfl:  .  Oxycodone HCl 10 MG TABS, Take 1 tablet (10 mg total) by mouth every 6 (six) hours. Max: 4/day., Disp: 120 tablet, Rfl: 0 .  pravastatin (PRAVACHOL) 40 MG tablet, Take 40 mg by mouth daily., Disp: , Rfl:  .  pregabalin (LYRICA) 100 MG capsule, Take 1 capsule (100 mg total) by mouth 2 (two) times daily., Disp: 60 capsule, Rfl: 2 .  ramipril (ALTACE) 5 MG capsule, Take 5 mg by mouth daily., Disp: , Rfl:   ROS  Constitutional: Denies any fever or chills Gastrointestinal: No reported hemesis,  hematochezia, vomiting, or acute GI distress Musculoskeletal: Denies any acute onset joint swelling, redness, loss of ROM, or weakness Neurological: No reported episodes of acute onset apraxia, aphasia, dysarthria, agnosia, amnesia, paralysis, loss of coordination, or loss of consciousness  Allergies  Donald Roberts has No Known Allergies.  [place]  Drug: Donald Roberts  reports that he does not use drugs. Alcohol:  reports that he drinks alcohol. Tobacco:  reports that he quit smoking about 16 months ago. His smoking use included e-cigarettes. He has a 40.00 pack-year smoking history. He has never used smokeless tobacco. Medical:  has a past medical history of Abdominal pain, generalized (07/18/2008), ALLERGIC RHINITIS (07/18/2008), Allergy, Back pain, chronic (10/01/2011), Carpal tunnel syndrome on left, Chronic low back pain (12/25/2012), COLONIC POLYPS, HX OF (07/18/2008), CORONARY ARTERY DISEASE (07/18/2008), Degenerative arthritis of right knee (10/01/2011), DIVERTICULOSIS, COLON (07/18/2008), Gross hematuria (07/18/2008), Headache(784.0), Heart murmur, HYPERLIPIDEMIA (07/18/2008), Hypertension, and TOBACCO ABUSE (04/06/2009). Surgical: Mr. Klumpp  has a past surgical history that includes s/p cervical disease x 2; lumbar surgury; Cardiac catheterization; Colonoscopy w/ biopsies and polypectomy; Anterior cervical decomp/discectomy fusion (N/A, 09/10/2013); Anterior cervical decomp/discectomy fusion (N/A, 03/14/2015); Colonoscopy; and Polypectomy. Family: family history includes Heart disease in his brother, mother, and other.  Constitutional Exam  General appearance: Well nourished, well developed, and well hydrated. In no apparent acute distress Vitals:   04/22/18 1107  BP: (!) 141/80  Pulse: 70  Resp: 16  Temp: 97.7 F (36.5 C)  SpO2: 97%  Weight: 168 lb (76.2 kg)  Height: 5' 10"  (1.778 m)   BMI Assessment: Estimated body mass index is 24.11 kg/m as calculated from the following:   Height as of this  encounter: 5'  10" (1.778 m).   Weight as of this encounter: 168 lb (76.2 kg).  BMI interpretation table: BMI level Category Range association with higher incidence of chronic pain  <18 kg/m2 Underweight   18.5-24.9 kg/m2 Ideal body weight   25-29.9 kg/m2 Overweight Increased incidence by 20%  30-34.9 kg/m2 Obese (Class I) Increased incidence by 68%  35-39.9 kg/m2 Severe obesity (Class II) Increased incidence by 136%  >40 kg/m2 Extreme obesity (Class III) Increased incidence by 254%   Patient's current BMI Ideal Body weight  Body mass index is 24.11 kg/m. Ideal body weight: 73 kg (160 lb 15 oz) Adjusted ideal body weight: 74.3 kg (163 lb 12.2 oz)   BMI Readings from Last 4 Encounters:  04/22/18 24.11 kg/m  04/17/18 24.47 kg/m  04/10/18 23.84 kg/m  04/01/18 22.96 kg/m   Wt Readings from Last 4 Encounters:  04/22/18 168 lb (76.2 kg)  04/17/18 173 lb (78.5 kg)  04/10/18 168 lb 8 oz (76.4 kg)  04/01/18 160 lb (72.6 kg)  Psych/Mental status: Alert, oriented x 3 (person, place, & time)       Eyes: PERLA Respiratory: No evidence of acute respiratory distress  Cervical Spine Area Exam  Skin & Axial Inspection: No masses, redness, edema, swelling, or associated skin lesions Alignment: Symmetrical Functional ROM: Decreased ROM      Stability: No instability detected Muscle Tone/Strength: Guarding observed Sensory (Neurological): Movement-associated pain Palpation: Complains of area being tender to palpation              Upper Extremity (UE) Exam    Side: Right upper extremity  Side: Left upper extremity  Skin & Extremity Inspection: Skin color, temperature, and hair growth are WNL. No peripheral edema or cyanosis. No masses, redness, swelling, asymmetry, or associated skin lesions. No contractures.  Skin & Extremity Inspection: Skin color, temperature, and hair growth are WNL. No peripheral edema or cyanosis. No masses, redness, swelling, asymmetry, or associated skin lesions. No  contractures.  Functional ROM: Unrestricted ROM          Functional ROM: Unrestricted ROM          Muscle Tone/Strength: Functionally intact. No obvious neuro-muscular anomalies detected.  Muscle Tone/Strength: Functionally intact. No obvious neuro-muscular anomalies detected.  Sensory (Neurological): Unimpaired          Sensory (Neurological): Unimpaired          Palpation: No palpable anomalies              Palpation: No palpable anomalies              Provocative Test(s):  Phalen's test: deferred Tinel's test: deferred Apley's scratch test (touch opposite shoulder):  Action 1 (Across chest): deferred Action 2 (Overhead): deferred Action 3 (LB reach): deferred   Provocative Test(s):  Phalen's test: deferred Tinel's test: deferred Apley's scratch test (touch opposite shoulder):  Action 1 (Across chest): deferred Action 2 (Overhead): deferred Action 3 (LB reach): deferred    Thoracic Spine Area Exam  Skin & Axial Inspection: No masses, redness, or swelling Alignment: Symmetrical Functional ROM: Unrestricted ROM Stability: No instability detected Muscle Tone/Strength: Functionally intact. No obvious neuro-muscular anomalies detected. Sensory (Neurological): Unimpaired Muscle strength & Tone: No palpable anomalies  Lumbar Spine Area Exam  Skin & Axial Inspection: No masses, redness, or swelling Alignment: Symmetrical Functional ROM: Unrestricted ROM       Stability: No instability detected Muscle Tone/Strength: Functionally intact. No obvious neuro-muscular anomalies detected. Sensory (Neurological): Unimpaired Palpation: No palpable anomalies  Provocative Tests: Lumbar Hyperextension/rotation test: deferred today       Lumbar quadrant test (Kemp's test): deferred today       Lumbar Lateral bending test: deferred today       Patrick's Maneuver: deferred today                   FABER test: deferred today       Thigh-thrust test: deferred today       S-I compression  test: deferred today       S-I distraction test: deferred today        Gait & Posture Assessment  Ambulation: Unassisted Gait: Relatively normal for age and body habitus Posture: WNL   Lower Extremity Exam    Side: Right lower extremity  Side: Left lower extremity  Stability: No instability observed          Stability: No instability observed          Skin & Extremity Inspection: Skin color, temperature, and hair growth are WNL. No peripheral edema or cyanosis. No masses, redness, swelling, asymmetry, or associated skin lesions. No contractures.  Skin & Extremity Inspection: Skin color, temperature, and hair growth are WNL. No peripheral edema or cyanosis. No masses, redness, swelling, asymmetry, or associated skin lesions. No contractures.  Functional ROM: Unrestricted ROM                  Functional ROM: Unrestricted ROM                  Muscle Tone/Strength: Functionally intact. No obvious neuro-muscular anomalies detected.  Muscle Tone/Strength: Functionally intact. No obvious neuro-muscular anomalies detected.  Sensory (Neurological): Unimpaired  Sensory (Neurological): Unimpaired  Palpation: No palpable anomalies  Palpation: No palpable anomalies   Assessment & Plan  Primary Diagnosis & Pertinent Problem List: The primary encounter diagnosis was Chronic pain syndrome. 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  Visit Diagnosis: 1. Chronic pain syndrome   2. Chronic neck pain (Primary Area of Pain) (Bilateral) (L>R)   3. Chronic shoulder pain (Secondary Area of Pain) (Left)   4. Chronic low back pain [facility] Area of Pain) (Bilateral)   5. Osteoarthritis of spine with radiculopathy, cervical region   6. Chronic cervical radiculopathy   7. Primary osteoarthritis of right knee   8. History of cervical spinal surgery   9. Failed back surgical syndrome   10. Disorder of skeletal system   11. Problems influencing health status   12. Pharmacologic therapy   13. Long term current use of opiate analgesic   14. Long term prescription benzodiazepine use   15. Spondylosis without myelopathy or radiculopathy, cervical region   16. Cervical facet syndrome (L)   17. Neurogenic pain    Problems updated and reviewed during this visit: No problems updated.  Time Note: Greater than 50% of the 40 minute(s) of face-to-face time spent with Donald Roberts, was spent in counseling/coordination of care regarding: the appropriate use of the pain  scale, Donald Roberts primary cause of pain, the results of his recent test(s), the significance of each one oth the test(s) anomalies and it's corresponding characteristic pain pattern(s), the treatment plan, treatment alternatives, the risks and possible complications of proposed treatment, medication side effects, the opioid analgesic risks and possible complications, realistic expectations, the goals of pain management (increased in functionality), the medication agreement, the patient's responsibilities when it comes to controlled substances and the need to collect and read the AVS material.  Plan of Care  Pharmacotherapy (Medications Ordered): Meds ordered this encounter  Medications  . Oxycodone HCl 10 MG TABS    Sig: Take 1 tablet (10 mg total) by mouth every 6 (six) hours. Max: 4/day.    Dispense:   120 tablet    Refill:  0    Medication for Chronic Pain (G89.4). [place] STOP ACT - Not applicable. Fill one day early if pharmacy is closed on scheduled refill date. Do not take in combination with benzodiazepines. Do not fill until: 04/28/18  To last until: 05/28/18    Procedure Orders     CERVICAL FACET (MEDIAL BRANCH NERVE BLOCK)  Lab Orders  No laboratory test(s) ordered today   Imaging Orders  No imaging studies ordered today   Referral Orders  No referral(s) requested today   Pharmacological management options:  Opioid Analgesics: We'll take over management today. See above orders Membrane stabilizer: We have discussed the possibility of optimizing this mode of therapy, if tolerated Muscle relaxant: We have discussed the possibility of a trial NSAID: We have discussed the possibility of a trial Other analgesic(s): To be determined at a later time   Interventional management options: Planned, scheduled, and/or pending:    Diagnostic left cervical facet block #1 unde fluoro and IV sedation    Considering:   Diagnostic left sided cervical facet nerve block (Refers having had some done by Dr. Maryjean Ka) Diagnostic left suprascapular nerve block  Possible left sided cervical facet RFA  Possible left suprascapular  RFA  Diagnostic bilateral lumbar facet nerve block  Diagnostic bilateral lumbar facet RFA    PRN Procedures:   None at this time   Provider-requested follow-up: Return for Procedure (w/ sedation): (L) C-FCT BLK #1.  Future Appointments  Date Time Provider [place]  05/12/2018  8:00 AM Milinda Pointer, MD [facility] None  04/27/2019  8:30 AM Eustace Pen, LPN [facility]  [id number]/3329  8:45 AM Jinny Sanders, MD [facility]    Primary Care Physician: Jinny Sanders, MD Location: [facility] Note by: Gaspar Cola, MD Date: 04/22/2018; Time: 12:29 PM

## 2018-04-22 ENCOUNTER — Ambulatory Visit: Payer: BLUE CROSS/BLUE SHIELD | Attending: Pain Medicine | Admitting: Pain Medicine

## 2018-04-22 ENCOUNTER — Encounter: Payer: Self-pay | Admitting: Family Medicine

## 2018-04-22 ENCOUNTER — Telehealth: Payer: Self-pay | Admitting: *Deleted

## 2018-04-22 ENCOUNTER — Other Ambulatory Visit: Payer: Self-pay

## 2018-04-22 VITALS — BP 141/80 | HR 70 | Temp 97.7°F | Resp 16 | Ht 70.0 in | Wt 168.0 lb

## 2018-04-22 DIAGNOSIS — M47812 Spondylosis without myelopathy or radiculopathy, cervical region: Secondary | ICD-10-CM | POA: Insufficient documentation

## 2018-04-22 DIAGNOSIS — M25512 Pain in left shoulder: Secondary | ICD-10-CM | POA: Diagnosis not present

## 2018-04-22 DIAGNOSIS — Z79899 Other long term (current) drug therapy: Secondary | ICD-10-CM | POA: Diagnosis not present

## 2018-04-22 DIAGNOSIS — G8929 Other chronic pain: Secondary | ICD-10-CM

## 2018-04-22 DIAGNOSIS — M792 Neuralgia and neuritis, unspecified: Secondary | ICD-10-CM

## 2018-04-22 DIAGNOSIS — M5412 Radiculopathy, cervical region: Secondary | ICD-10-CM

## 2018-04-22 DIAGNOSIS — G9589 Other specified diseases of spinal cord: Secondary | ICD-10-CM | POA: Insufficient documentation

## 2018-04-22 DIAGNOSIS — Z9889 Other specified postprocedural states: Secondary | ICD-10-CM | POA: Diagnosis not present

## 2018-04-22 DIAGNOSIS — Z79891 Long term (current) use of opiate analgesic: Secondary | ICD-10-CM | POA: Insufficient documentation

## 2018-04-22 DIAGNOSIS — M1711 Unilateral primary osteoarthritis, right knee: Secondary | ICD-10-CM

## 2018-04-22 DIAGNOSIS — Z86010 Personal history of colonic polyps: Secondary | ICD-10-CM | POA: Insufficient documentation

## 2018-04-22 DIAGNOSIS — M5010 Cervical disc disorder with radiculopathy, unspecified cervical region: Secondary | ICD-10-CM | POA: Diagnosis not present

## 2018-04-22 DIAGNOSIS — I2510 Atherosclerotic heart disease of native coronary artery without angina pectoris: Secondary | ICD-10-CM | POA: Insufficient documentation

## 2018-04-22 DIAGNOSIS — Z87891 Personal history of nicotine dependence: Secondary | ICD-10-CM | POA: Insufficient documentation

## 2018-04-22 DIAGNOSIS — M79602 Pain in left arm: Secondary | ICD-10-CM | POA: Diagnosis not present

## 2018-04-22 DIAGNOSIS — Z789 Other specified health status: Secondary | ICD-10-CM | POA: Diagnosis not present

## 2018-04-22 DIAGNOSIS — M545 Low back pain: Secondary | ICD-10-CM

## 2018-04-22 DIAGNOSIS — M961 Postlaminectomy syndrome, not elsewhere classified: Secondary | ICD-10-CM

## 2018-04-22 DIAGNOSIS — E785 Hyperlipidemia, unspecified: Secondary | ICD-10-CM | POA: Diagnosis not present

## 2018-04-22 DIAGNOSIS — M899 Disorder of bone, unspecified: Secondary | ICD-10-CM

## 2018-04-22 DIAGNOSIS — M960 Pseudarthrosis after fusion or arthrodesis: Secondary | ICD-10-CM | POA: Diagnosis not present

## 2018-04-22 DIAGNOSIS — Z981 Arthrodesis status: Secondary | ICD-10-CM | POA: Insufficient documentation

## 2018-04-22 DIAGNOSIS — M2578 Osteophyte, vertebrae: Secondary | ICD-10-CM | POA: Diagnosis not present

## 2018-04-22 DIAGNOSIS — G894 Chronic pain syndrome: Secondary | ICD-10-CM | POA: Diagnosis not present

## 2018-04-22 DIAGNOSIS — M542 Cervicalgia: Secondary | ICD-10-CM | POA: Diagnosis not present

## 2018-04-22 DIAGNOSIS — M4722 Other spondylosis with radiculopathy, cervical region: Secondary | ICD-10-CM

## 2018-04-22 MED ORDER — OXYCODONE HCL 10 MG PO TABS
10.0000 mg | ORAL_TABLET | Freq: Four times a day (QID) | ORAL | 0 refills | Status: DC
Start: 2018-04-28 — End: 2018-11-23

## 2018-04-22 NOTE — Telephone Encounter (Signed)
Left message for patient to notify them that it is time to schedule annual low dose lung cancer screening CT scan. Instructed patient to call back to verify information prior to the scan being scheduled.  

## 2018-04-22 NOTE — Patient Instructions (Addendum)
____________________________________________________________________________________________  Pain Scale  Introduction: The pain score used by this practice is the Verbal Numerical Rating Scale (VNRS-11). This is an 11-point scale. It is for adults and children 10 years or older. There are significant differences in how the pain score is reported, used, and applied. Forget everything you learned in the past and learn this scoring system.  General Information: The scale should reflect your current level of pain. Unless you are specifically asked for the level of your worst pain, or your average pain. If you are asked for one of these two, then it should be understood that it is over the past 24 hours.  Basic Activities of Daily Living (ADL): Personal hygiene, dressing, eating, transferring, and using restroom.  Instructions: Most patients tend to report their level of pain as a combination of two factors, their physical pain and their psychosocial pain. This last one is also known as "suffering" and it is reflection of how physical pain affects you socially and psychologically. From now on, report them separately. From this point on, when asked to report your pain level, report only your physical pain. Use the following table for reference.  Pain Clinic Pain Levels (0-5/10)  Pain Level Score  Description  No Pain 0   Mild pain 1 Nagging, annoying, but does not interfere with basic activities of daily living (ADL). Patients are able to eat, bathe, get dressed, toileting (being able to get on and off the toilet and perform personal hygiene functions), transfer (move in and out of bed or a chair without assistance), and maintain continence (able to control bladder and bowel functions). Blood pressure and heart rate are unaffected. A normal heart rate for a healthy adult ranges from 60 to 100 bpm (beats per minute).   Mild to moderate pain 2 Noticeable and distracting. Impossible to hide from other  people. More frequent flare-ups. Still possible to adapt and function close to normal. It can be very annoying and may have occasional stronger flare-ups. With discipline, patients may get used to it and adapt.   Moderate pain 3 Interferes significantly with activities of daily living (ADL). It becomes difficult to feed, bathe, get dressed, get on and off the toilet or to perform personal hygiene functions. Difficult to get in and out of bed or a chair without assistance. Very distracting. With effort, it can be ignored when deeply involved in activities.   Moderately severe pain 4 Impossible to ignore for more than a few minutes. With effort, patients may still be able to manage work or participate in some social activities. Very difficult to concentrate. Signs of autonomic nervous system discharge are evident: dilated pupils (mydriasis); mild sweating (diaphoresis); sleep interference. Heart rate becomes elevated (>115 bpm). Diastolic blood pressure (lower number) rises above 100 mmHg. Patients find relief in laying down and not moving.   Severe pain 5 Intense and extremely unpleasant. Associated with frowning face and frequent crying. Pain overwhelms the senses.  Ability to do any activity or maintain social relationships becomes significantly limited. Conversation becomes difficult. Pacing back and forth is common, as getting into a comfortable position is nearly impossible. Pain wakes you up from deep sleep. Physical signs will be obvious: pupillary dilation; increased sweating; goosebumps; brisk reflexes; cold, clammy hands and feet; nausea, vomiting or dry heaves; loss of appetite; significant sleep disturbance with inability to fall asleep or to remain asleep. When persistent, significant weight loss is observed due to the complete loss of appetite and sleep deprivation.  Blood   pressure and heart rate becomes significantly elevated. Caution: If elevated blood pressure triggers a pounding headache  associated with blurred vision, then the patient should immediately seek attention at an urgent or emergency care unit, as these may be signs of an impending stroke.    Emergency Department Pain Levels (6-10/10)  Emergency Room Pain 6 Severely limiting. Requires emergency care and should not be seen or managed at an outpatient pain management facility. Communication becomes difficult and requires great effort. Assistance to reach the emergency department may be required. Facial flushing and profuse sweating along with potentially dangerous increases in heart rate and blood pressure will be evident.   Distressing pain 7 Self-care is very difficult. Assistance is required to transport, or use restroom. Assistance to reach the emergency department will be required. Tasks requiring coordination, such as bathing and getting dressed become very difficult.   Disabling pain 8 Self-care is no longer possible. At this level, pain is disabling. The individual is unable to do even the most "basic" activities such as walking, eating, bathing, dressing, transferring to a bed, or toileting. Fine motor skills are lost. It is difficult to think clearly.   Incapacitating pain 9 Pain becomes incapacitating. Thought processing is no longer possible. Difficult to remember your own name. Control of movement and coordination are lost.   The worst pain imaginable 10 At this level, most patients pass out from pain. When this level is reached, collapse of the autonomic nervous system occurs, leading to a sudden drop in blood pressure and heart rate. This in turn results in a temporary and dramatic drop in blood flow to the brain, leading to a loss of consciousness. Fainting is one of the body's self defense mechanisms. Passing out puts the brain in a calmed state and causes it to shut down for a while, in order to begin the healing process.    Summary: 1. Refer to this scale when providing [place] with your pain level. 2. Be  accurate and careful when reporting your pain level. This will help with your care. 3. Over-reporting your pain level will lead to loss of credibility. 4. Even a level of 1/10 means that there is pain and will be treated at our facility. 5. High, inaccurate reporting will be documented as "Symptom Exaggeration", leading to loss of credibility and suspicions of possible secondary gains such as obtaining more narcotics, or wanting to appear disabled, for fraudulent reasons. 6. Only pain levels of 5 or below will be seen at our facility. 7. Pain levels of 6 and above will be sent to the Emergency Department and the appointment cancelled. ____________________________________________________________________________________________   ____________________________________________________________________________________________  Medication Rules  Applies to: All patients receiving prescriptions (written or electronic).  Pharmacy of record: Pharmacy where electronic prescriptions will be sent. If written prescriptions are taken to a different pharmacy, please inform the nursing staff. The pharmacy listed in the electronic medical record should be the one where you would like electronic prescriptions to be sent.  Prescription refills: Only during scheduled appointments. Applies to both, written and electronic prescriptions.  NOTE: The following applies primarily to controlled substances (Opioid* Pain Medications).   Patient's responsibilities: 1. Pain Pills: Bring all pain pills to every appointment (except for procedure appointments). 2. Pill Bottles: Bring pills in original pharmacy bottle. Always bring newest bottle. Bring bottle, even if empty. 3. Medication refills: You are responsible for knowing and keeping track of what medications you need refilled. The day before your appointment, write a list of all  prescriptions that need to be refilled. Bring that list to your appointment and give it to the  admitting nurse. Prescriptions will be written only during appointments. If you forget a medication, it will not be "Called in", "Faxed", or "electronically sent". You will need to get another appointment to get these prescribed. 4. Prescription Accuracy: You are responsible for carefully inspecting your prescriptions before leaving our office. Have the discharge nurse carefully go over each prescription with you, before taking them home. Make sure that your name is accurately spelled, that your address is correct. Check the name and dose of your medication to make sure it is accurate. Check the number of pills, and the written instructions to make sure they are clear and accurate. Make sure that you are given enough medication to last until your next medication refill appointment. 5. Taking Medication: Take medication as prescribed. Never take more pills than instructed. Never take medication more frequently than prescribed. Taking less pills or less frequently is permitted and encouraged, when it comes to controlled substances (written prescriptions).  6. Inform other Doctors: Always inform, all of your healthcare providers, of all the medications you take. 7. Pain Medication from other Providers: You are not allowed to accept any additional pain medication from any other Doctor or Healthcare provider. There are two exceptions to this rule. (see below) In the event that you require additional pain medication, you are responsible for notifying us, as stated below. 8. Medication Agreement: You are responsible for carefully reading and following our Medication Agreement. This must be signed before receiving any prescriptions from our practice. Safely store a copy of your signed Agreement. Violations to the Agreement will result in no further prescriptions. (Additional copies of our Medication Agreement are available upon request.) 9. Laws, Rules, & Regulations: All patients are expected to follow all Federal  and [employer], [employer], Rules, [employer]. Ignorance of the Laws does not constitute a valid excuse. The use of any illegal substances is prohibited. 10. Adopted CDC guidelines & recommendations: Target dosing levels will be at or below 60 MME/day. Use of benzodiazepines** is not recommended.  Exceptions: There are only two exceptions to the rule of not receiving pain medications from other Healthcare Providers. 1. Exception #1 (Emergencies): In the event of an emergency (i.e.: accident requiring emergency care), you are allowed to receive additional pain medication. However, you are responsible for: As soon as you are able, call our office [phone], at any time of the day or night, and leave a message stating your name, the date and nature of the emergency, and the name and dose of the medication prescribed. In the event that your call is answered by a member of our staff, make sure to document and save the date, time, and the name of the person that took your information.  2. Exception #2 (Planned Surgery): In the event that you are scheduled by another doctor or dentist to have any type of surgery or procedure, you are allowed (for a period no longer than 30 days), to receive additional pain medication, for the acute post-op pain. However, in this case, you are responsible for picking up a copy of our "Post-op Pain Management for Surgeons" handout, and giving it to your surgeon or dentist. This document is available at our office, and does not require an appointment to obtain it. Simply go to our office during business hours (Monday-Thursday from 8:00 AM to 4:00 PM) (Friday 8:00 AM to 12:00 Noon) or if you  have a scheduled appointment with [place], prior to your surgery, and ask for it by name. In addition, you will need to provide [place] with your name, name of your surgeon, type of surgery, and date of procedure or surgery.  *Opioid medications include: morphine, codeine, oxycodone, oxymorphone,  hydrocodone, hydromorphone, meperidine, tramadol, tapentadol, buprenorphine, fentanyl, methadone. **Benzodiazepine medications include: diazepam (Valium), alprazolam (Xanax), clonazepam (Klonopine), lorazepam (Ativan), clorazepate (Tranxene), chlordiazepoxide (Librium), estazolam (Prosom), oxazepam (Serax), temazepam (Restoril), triazolam (Halcion) (Last updated: 01/29/2018) ____________________________________________________________________________________________   ____________________________________________________________________________________________  Medication Recommendations and Reminders  Applies to: All patients receiving prescriptions (written and/or electronic).  Medication Rules & Regulations: These rules and regulations exist for your safety and that of others. They are not flexible and neither are we. Dismissing or ignoring them will be considered "non-compliance" with medication therapy, resulting in complete and irreversible termination of such therapy. (See document titled "Medication Rules" for more details.) In all conscience, because of safety reasons, we cannot continue providing a therapy where the patient does not follow instructions.  Pharmacy of record:   Definition: This is the pharmacy where your electronic prescriptions will be sent.   We do not endorse any particular pharmacy.  You are not restricted in your choice of pharmacy.  The pharmacy listed in the electronic medical record should be the one where you want electronic prescriptions to be sent.  If you choose to change pharmacy, simply notify our nursing staff of your choice of new pharmacy.  Recommendations:  Keep all of your pain medications in a safe place, under lock and key, even if you live alone.   After you fill your prescription, take 1 week's worth of pills and put them away in a safe place. You should keep a separate, properly labeled bottle for this purpose. The remainder should be kept  in the original bottle. Use this as your primary supply, until it runs out. Once it's gone, then you know that you have 1 week's worth of medicine, and it is time to come in for a prescription refill. If you do this correctly, it is unlikely that you will ever run out of medicine.  To make sure that the above recommendation works, it is very important that you make sure your medication refill appointments are scheduled at least 1 week before you run out of medicine. To do this in an effective manner, make sure that you do not leave the office without scheduling your next medication management appointment. Always ask the nursing staff to show you in your prescription , when your medication will be running out. Then arrange for the receptionist to get you a return appointment, at least 7 days before you run out of medicine. Do not wait until you have 1 or 2 pills left, to come in. This is very poor planning and does not take into consideration that we may need to cancel appointments due to bad weather, sickness, or emergencies affecting our staff.  Prescription refills and/or changes in medication(s):   Prescription refills, and/or changes in dose or medication, will be conducted only during scheduled medication management appointments. (Applies to both, written and electronic prescriptions.)  No refills on procedure days. No medication will be changed or started on procedure days. No changes, adjustments, and/or refills will be conducted on a procedure day. Doing so will interfere with the diagnostic portion of the procedure.  No phone refills. No medications will be "called into the pharmacy".  No Fax refills.  No weekend refills.  No Holliday refills.  No after hours refills.  Remember:  Business hours are:  Monday to Thursday 8:00 AM to 4:00 PM Provider's Schedule: Dionisio David, NP - Appointments are:  Medication management: Monday to Thursday 8:00 AM to 4:00 PM Milinda Pointer, MD -  Appointments are:  Medication management: Monday and Wednesday 8:00 AM to 4:00 PM Procedure day: Tuesday and Thursday 7:30 AM to 4:00 PM Gillis Santa, MD - Appointments are:  Medication management: Tuesday and Thursday 8:00 AM to 4:00 PM Procedure day: Monday and Wednesday 7:30 AM to 4:00 PM (Last update: 01/29/2018) ____________________________________________________________________________________________   ____________________________________________________________________________________________  CANNABIDIOL (AKA: CBD Oil or Pills)  Applies to: All patients receiving prescriptions of controlled substances (written and/or electronic).  General Information: Cannabidiol (CBD) was discovered in [birth year]. It is one of some 113 identified cannabinoids in cannabis (Marijuana) plants, accounting for up to 40% of the plant's extract. As of 2018, preliminary clinical research on cannabidiol included studies of anxiety, cognition, movement disorders, and pain.  Cannabidiol is consummed in multiple ways, including inhalation of cannabis smoke or vapor, as an aerosol spray into the cheek, and by mouth. It may be supplied as CBD oil containing CBD as the active ingredient (no added tetrahydrocannabinol (THC) or terpenes), a full-plant CBD-dominant hemp extract oil, capsules, dried cannabis, or as a liquid solution. CBD is thought not have the same psychoactivity as THC, and may affect the actions of THC. Studies suggest that CBD may interact with different biological targets, including cannabinoid receptors and other neurotransmitter receptors. As of 2018 the mechanism of action for its biological effects has not been determined.  In the [place], cannabidiol has a limited approval by the Food and Drug Administration (FDA) for treatment of only two types of epilepsy disorders. The side effects of long-term use of the drug include somnolence, decreased appetite, diarrhea, fatigue, malaise, weakness,  sleeping problems, and others.  CBD remains a Schedule I drug prohibited for any use.  Legality: Some manufacturers ship CBD products nationally, an illegal action which the FDA has not enforced in 2018, with CBD remaining the subject of an FDA investigational new drug evaluation, and is not considered legal as a dietary supplement or food ingredient as of December 2018. Federal illegality has made it difficult historically to conduct research on CBD. CBD is openly sold in head shops and health food stores in some states where such sales have not been explicitly legalized.  Warning: Because it is not FDA approved for general use or treatment of pain, it is not required to undergo the same manufacturing controls as prescription drugs.  This means that the available cannabidiol (CBD) may be contaminated with THC.  If this is the case, it will trigger a positive urine drug screen (UDS) test for cannabinoids (Marijuana).  Because a positive UDS for illicit substances is a violation of our medication agreement, your opioid analgesics (pain medicine) may be permanently discontinued. (Last update: 02/19/2018) ____________________________________________________________________________________________   ____________________________________________________________________________________________  Preparing for Procedure with Sedation  Instructions: . Oral Intake: Do not eat or drink anything for at least 8 hours prior to your procedure. . Transportation: Public transportation is not allowed. Bring an adult driver. The driver must be physically present in our waiting room before any procedure can be started. [place] Physical Assistance: Bring an adult physically capable of assisting you, in the event you need help. This adult should keep you company at home for at least 6 hours after the procedure. . Blood Pressure Medicine: Take your blood pressure medicine with  a sip of water the morning of the  procedure. . Blood thinners:  . Diabetics on insulin: Notify the staff so that you can be scheduled 1st case in the morning. If your diabetes requires high dose insulin, take only  of your normal insulin dose the morning of the procedure and notify the staff that you have done so. . Preventing infections: Shower with an antibacterial soap the morning of your procedure. . Build-up your immune system: Take 1000 mg of Vitamin C with every meal (3 times a day) the day prior to your procedure. [place] Antibiotics: Inform the staff if you have a condition or reason that requires you to take antibiotics before dental procedures. . Pregnancy: If you are pregnant, call and cancel the procedure. . Sickness: If you have a cold, fever, or any active infections, call and cancel the procedure. . Arrival: You must be in the facility at least 30 minutes prior to your scheduled procedure. . Children: Do not bring children with you. . Dress appropriately: Bring dark clothing that you would not mind if they get stained. . Valuables: Do not bring any jewelry or valuables.  Procedure appointments are reserved for interventional treatments only. [place] No Prescription Refills. . No medication changes will be discussed during procedure appointments. . No disability issues will be discussed.  Remember:  Regular Business hours are:  Monday to Thursday 8:00 AM to 4:00 PM  Provider's Schedule: Delano Metz, MD:  Procedure days: Tuesday and Thursday 7:30 AM to 4:00 PM  Edward Jolly, MD:  Procedure days: Monday and Wednesday 7:30 AM to 4:00 PM ____________________________________________________________________________________________   ____________________________________________________________________________________________  Preparing for Procedure with Sedation  Instructions: . Oral Intake: Do not eat or drink anything for at least 8 hours prior to your procedure. . Transportation: Public transportation is  not allowed. Bring an adult driver. The driver must be physically present in our waiting room before any procedure can be started. [place] Kitchen Physical Assistance: Bring an adult physically capable of assisting you, in the event you need help. This adult should keep you company at home for at least 6 hours after the procedure. . Blood Pressure Medicine: Take your blood pressure medicine with a sip of water the morning of the procedure. . Blood thinners:  . Diabetics on insulin: Notify the staff so that you can be scheduled 1st case in the morning. If your diabetes requires high dose insulin, take only  of your normal insulin dose the morning of the procedure and notify the staff that you have done so. . Preventing infections: Shower with an antibacterial soap the morning of your procedure. . Build-up your immune system: Take 1000 mg of Vitamin C with every meal (3 times a day) the day prior to your procedure. [place]land Kitchen Antibiotics: Inform the staff if you have a condition or reason that requires you to take antibiotics before dental procedures. . Pregnancy: If you are pregnant, call and cancel the procedure. . Sickness: If you have a cold, fever, or any active infections, call and cancel the procedure. . Arrival: You must be in the facility at least 30 minutes prior to your scheduled procedure. . Children: Do not bring children with you. . Dress appropriately: Bring dark clothing that you would not mind if they get stained. . Valuables: Do not bring any jewelry or valuables.  Procedure appointments are reserved for interventional treatments only. [place] No Prescription Refills. . No medication changes will be discussed during procedure appointments. . No disability issues will be discussed.  Remember:  Regular Business hours are:  Monday to Thursday 8:00 AM to 4:00 PM  Provider's Schedule: Delano Metz, MD:  Procedure days: Tuesday and Thursday 7:30 AM to 4:00 PM  Edward Jolly, MD:  Procedure days:  Monday and Wednesday 7:30 AM to 4:00 PM ____________________________________________________________________________________________   Facet Joint Block The facet joints connect the bones of the spine (vertebrae). They make it possible for you to bend, twist, and make other movements with your spine. They also keep you from bending too far, twisting too far, and making other excessive movements. A facet joint block is a procedure where a numbing medicine (anesthetic) is injected into a facet joint. Often, a type of anti-inflammatory medicine called a steroid is also injected. A facet joint block may be done to diagnose neck or back pain. If the pain gets better after a facet joint block, it means the pain is probably coming from the facet joint. If the pain does not get better, it means the pain is probably not coming from the facet joint. A facet joint block may also be done to relieve neck or back pain caused by an inflamed facet joint. A facet joint block is only done to relieve pain if the pain does not improve with other methods, such as medicine, exercise programs, and physical therapy. Tell a health care provider about:  Any allergies you have.  All medicines you are taking, including vitamins, herbs, eye drops, creams, and over-the-counter medicines.  Any problems you or family members have had with anesthetic medicines.  Any blood disorders you have.  Any surgeries you have had.  Any medical conditions you have.  Whether you are pregnant or may be pregnant. What are the risks? Generally, this is a safe procedure. However, problems may occur, including:  Bleeding.  Injury to a nerve near the injection site.  Pain at the injection site.  Weakness or numbness in areas controlled by nerves near the injection site.  Infection.  Temporary fluid retention.  Allergic reactions to medicines or dyes.  Injury to other structures or organs near the injection site.  What happens  before the procedure?  Follow instructions from your health care provider about eating or drinking restrictions.  Ask your health care provider about: ? Changing or stopping your regular medicines. This is especially important if you are taking diabetes medicines or blood thinners. ? Taking medicines such as aspirin and ibuprofen. These medicines can thin your blood. Do not take these medicines before your procedure if your health care provider instructs you not to.  Do not take any new dietary supplements or medicines without asking your health care provider first.  Plan to have someone take you home after the procedure. What happens during the procedure?  You may need to remove your clothing and dress in an open-back gown.  The procedure will be done while you are lying on an X-ray table. You will most likely be asked to lie on your stomach, but you may be asked to lie in a different position if an injection will be made in your neck.  Machines will be used to monitor your oxygen levels, heart rate, and blood pressure.  If an injection will be made in your neck, an IV tube will be inserted into one of your veins. Fluids and medicine will flow directly into your body through the IV tube.  The area over the facet joint where the injection will be made will be cleaned with soap. The surrounding skin  will be covered with clean drapes.  A numbing medicine (local anesthetic) will be applied to your skin. Your skin may sting or burn for a moment.  A video X-ray machine (fluoroscopy) will be used to locate the joint. In some cases, a CT scan may be used.  A contrast dye may be injected into the facet joint area to help locate the joint.  When the joint is located, an anesthetic will be injected into the joint through the needle.  Your health care provider will ask you whether you feel pain relief. If you do feel relief, a steroid may be injected to provide pain relief for a longer period of  time. If you do not feel relief or feel only partial relief, additional injections of an anesthetic may be made in other facet joints.  The needle will be removed.  Your skin will be cleaned.  A bandage (dressing) will be applied over each injection site. The procedure may vary among health care providers and hospitals. What happens after the procedure?  You will be observed for 15-30 minutes before being allowed to go home. This information is not intended to replace advice given to you by your health care provider. Make sure you discuss any questions you have with your health care provider. Document Released: 04/09/2007 Document Revised: 12/20/2015 Document Reviewed: 08/14/2015 Elsevier Interactive Patient Education  [employer].

## 2018-04-23 DIAGNOSIS — Z981 Arthrodesis status: Secondary | ICD-10-CM | POA: Diagnosis not present

## 2018-04-23 DIAGNOSIS — M47812 Spondylosis without myelopathy or radiculopathy, cervical region: Secondary | ICD-10-CM | POA: Diagnosis not present

## 2018-04-28 DIAGNOSIS — Z981 Arthrodesis status: Secondary | ICD-10-CM | POA: Diagnosis not present

## 2018-04-30 DIAGNOSIS — R202 Paresthesia of skin: Secondary | ICD-10-CM | POA: Diagnosis not present

## 2018-05-03 ENCOUNTER — Encounter: Payer: Self-pay | Admitting: Pain Medicine

## 2018-05-05 ENCOUNTER — Telehealth: Payer: Self-pay | Admitting: *Deleted

## 2018-05-05 NOTE — Telephone Encounter (Signed)
Left message for patient to notify them that it is time to schedule annual low dose lung cancer screening CT scan. Instructed patient to call back to verify information prior to the scan being scheduled.  

## 2018-05-06 ENCOUNTER — Telehealth: Payer: Self-pay | Admitting: *Deleted

## 2018-05-06 DIAGNOSIS — Z87891 Personal history of nicotine dependence: Secondary | ICD-10-CM

## 2018-05-06 DIAGNOSIS — Z122 Encounter for screening for malignant neoplasm of respiratory organs: Secondary | ICD-10-CM

## 2018-05-06 NOTE — Telephone Encounter (Signed)
Notified patient that annual lung cancer screening low dose CT scan is due currently or will be in near future. Confirmed that patient is within the age range of 55-77, and asymptomatic, (no signs or symptoms of lung cancer). Patient denies illness that would prevent curative treatment for lung cancer if found. Verified smoking history, (former, quit 12/23/16, 40 pack year). The shared decision making visit was done 02/03/17. Patient is agreeable for CT scan being scheduled.

## 2018-05-07 DIAGNOSIS — M79671 Pain in right foot: Secondary | ICD-10-CM | POA: Diagnosis not present

## 2018-05-07 DIAGNOSIS — S92301D Fracture of unspecified metatarsal bone(s), right foot, subsequent encounter for fracture with routine healing: Secondary | ICD-10-CM | POA: Diagnosis not present

## 2018-05-07 DIAGNOSIS — M79672 Pain in left foot: Secondary | ICD-10-CM | POA: Diagnosis not present

## 2018-05-11 DIAGNOSIS — M542 Cervicalgia: Secondary | ICD-10-CM | POA: Insufficient documentation

## 2018-05-11 NOTE — Progress Notes (Signed)
Procedure cancelled.  Note: The patient comes into the clinics today scheduled for a diagnostic, left sided, cervical facet block. Upon admitting the patient, and while he was undergoing the preprocedure assessment, the nurses Donald Roberts(Donald Tice, RN) heard the patient say that he was unaware that he was can I have a procedure done today. In addition, he questioned why he would be having the procedure done, citing that he had had this procedure done in the past and he had not helped. The patient also commented that he understood that he had to have this procedure done "so that I can get my medicine". The nurse, being aware that this is not the way that we practice, immediately came in and notified me. On the patient's last visit, the patient had indicated that he had the procedure done at [facility] by Dr. Odette FractionPaul Roberts. The patient had also indicated that he had been discharged from Dr. Ollen Roberts practice, but when asked about the reason, he was not very specific.  Because of the above-mentioned patient's statements, I went on and reviewed my note. As stated in the note, "I also made it clear that undergoing interventional therapies for the purpose of getting pain medications is very inappropriate on the part of a patient, and it will not be tolerated in this practice. This type of behavior would suggest true addiction and therefore it requires referral to an addiction specialist.". In that note, I also documented that I spent "Greater than 50% of the 40 minute(s) of face-to-face time spent with Donald Roberts, was spent in counseling/coordination of care regarding: the appropriate use of the pain scale, Donald Roberts's primary cause of pain, the results of his recent test(s), the significance of each one oth the test(s) anomalies and it's corresponding characteristic pain pattern(s), the treatment plan, treatment alternatives, the risks and possible complications of proposed treatment, medication side  effects, the opioid analgesic risks and possible complications, realistic expectations, the goals of pain management (increased in functionality), the medication agreement, the patient's responsibilities when it comes to controlled substances and the need to collect and read the AVS material."  The AVS provided to the patient was very clear as to what our plan was and it contained information regarding the procedure, the rules and regulations associated with our medications, among other things.  Been concerned about the patient's statements, I picked up the phone and personally called Dr. Odette FractionPaul Roberts to see what type of procedures the patient has had done, what the results were, and the reason why he was discharged from his clinic. Dr. Ollen Roberts indicated that the last time that he had done a procedure on this patient was approximately 5 years ago, after which he had another cervical surgery by a now retired Donald Roberts. Dr. Ollen Roberts indicated that the patient was released from his practice due to the fact that he was not keeping his follow-up appointments for the medications and would send family members to pickup the prescriptions.  In view of the above information, I decided to have a talk with the patient before proceeding with the interventional treatment. The purpose of this talk was to address the above issues before they actually became a problem. As well as I walked into the room, I informed the patient of my concerns and he confirmed having reported the above to the admitting nurse Donald Roberts(Donald Tice, RN). I informed him, as I had stated before, that undergoing procedures simply to get opioids was very disconcerting and suggested the possibility of "addiction".  Being this to case, I do not feel comfortable proceeding with diagnostic injection or continuing to write opioids. At this point, I informed the patient that I was going to cancel today's procedure. Once he confirmed this, I proceeded to inform him of  my concerns. The patient confirmed that he had not read the AVS, as I had instructed him to do. Furthermore, he proceeded to deny that I had informed them of the procedure and what the plan was. Of course, remembering that I had in fact provided the patient with all of this information and taken the time to documented, I was very concerned of what he was saying. On my part, I am 100% sure that I did provide him with all the information since it is my having to do so and I did not make any exceptions in his case. At this point, the patient was clearly going into a defensive mode as the volume of his voice increased and the conversation became difficult as he would constantly interrupt me, nor allowing me to finish any of my sentences. I informed him that "I strongly disagree" with his recollection of events as I was 100% sure of the information that I have provided him. At this point, he voiced that I was "calling him a liar". By now, he was very evident to me that are "patient-physician relationship" had deteriorated to a point of no recovery.   Today I informed the patient that we would be terminating our patient-physician relationship, effective immediately. As per guidelines of the nerve, and medical board, we will remain available to him for pain related medical emergencies for the next 30 days. This period well and on 06/11/2018.

## 2018-05-12 ENCOUNTER — Ambulatory Visit: Payer: BLUE CROSS/BLUE SHIELD | Attending: Pain Medicine | Admitting: Pain Medicine

## 2018-05-12 ENCOUNTER — Other Ambulatory Visit: Payer: Self-pay

## 2018-05-12 ENCOUNTER — Encounter: Payer: Self-pay | Admitting: Pain Medicine

## 2018-05-12 ENCOUNTER — Ambulatory Visit: Admission: RE | Admit: 2018-05-12 | Payer: BLUE CROSS/BLUE SHIELD | Source: Ambulatory Visit

## 2018-05-12 VITALS — BP 123/80 | HR 73 | Temp 97.9°F | Resp 18 | Ht 70.0 in | Wt 165.0 lb

## 2018-05-12 DIAGNOSIS — I2510 Atherosclerotic heart disease of native coronary artery without angina pectoris: Secondary | ICD-10-CM

## 2018-05-12 DIAGNOSIS — G8929 Other chronic pain: Secondary | ICD-10-CM | POA: Insufficient documentation

## 2018-05-12 DIAGNOSIS — M542 Cervicalgia: Secondary | ICD-10-CM | POA: Insufficient documentation

## 2018-05-12 DIAGNOSIS — M47812 Spondylosis without myelopathy or radiculopathy, cervical region: Secondary | ICD-10-CM | POA: Diagnosis not present

## 2018-05-12 DIAGNOSIS — Z5309 Procedure and treatment not carried out because of other contraindication: Secondary | ICD-10-CM | POA: Insufficient documentation

## 2018-05-12 MED ORDER — MIDAZOLAM HCL 5 MG/5ML IJ SOLN: 1.0000 mg | INTRAMUSCULAR | Status: DC | PRN

## 2018-05-12 MED ORDER — DEXAMETHASONE SODIUM PHOSPHATE 10 MG/ML IJ SOLN: 10.0000 mg | Freq: Once | INTRAMUSCULAR | Status: DC

## 2018-05-12 MED ORDER — LACTATED RINGERS IV SOLN: 1000.0000 mL | Freq: Once | INTRAVENOUS | Status: DC

## 2018-05-12 MED ORDER — LIDOCAINE HCL 2 % IJ SOLN
20.0000 mL | Freq: Once | INTRAMUSCULAR | Status: DC
Start: 2018-05-12 — End: 2018-05-12

## 2018-05-12 MED ORDER — FENTANYL CITRATE (PF) 100 MCG/2ML IJ SOLN: 25.0000 ug | INTRAMUSCULAR | Status: DC | PRN

## 2018-05-12 MED ORDER — ROPIVACAINE HCL 2 MG/ML IJ SOLN: 9.0000 mL | Freq: Once | INTRAMUSCULAR | Status: DC

## 2018-05-12 NOTE — Progress Notes (Addendum)
Safety precautions to be maintained throughout the outpatient stay will include: orient to surroundings, keep bed in low position, maintain call bell within reach at all times, provide assistance with transfer out of bed and ambulation.  Upon admission, patient states he was having his neck region done today.  States that he didn't even know that he was having a procedure until he checked out at the last appointment.  Approval had been done for Lumbar facet block.  Patient states "it figures"  Dr Laban EmperorNaveira notified and it was clarified that the order was for cervical facet block.  Spoke with Alona BeneJoyce, [employer] insurance clerk and she got on the phone to get the cervical facets approved.  She was successful in getting it approved for today.  The patient was notified that approval had been gotten. Patient states "Of course, its all about the money".  Informed patient that this was done to assure that he did not get stuck with the entire bill.  While setting up to start IV, patient states he has had ten of these and nothing has worked and he did not understand why he was having it again.  Again, I asked did he and Dr Laban EmperorNaveira not talk about this at all, and he said no, not at all.  Patient stated "I guess I have to do this to get pain medication.  We all know that they do not make any money off of medications"  Informed patient that I would hold off on starting IV until he talked with Dr Laban EmperorNaveira.  Spoke with Dr Laban EmperorNaveira and informed him of above and that I would not start IV until he spoke with patient.

## 2018-05-13 ENCOUNTER — Telehealth: Payer: Self-pay | Admitting: *Deleted

## 2018-05-14 ENCOUNTER — Ambulatory Visit
Admission: RE | Admit: 2018-05-14 | Discharge: 2018-05-14 | Disposition: A | Discharge status: Home / Self Care | Payer: BLUE CROSS/BLUE SHIELD | Source: Ambulatory Visit | Attending: Nurse Practitioner | Admitting: Nurse Practitioner

## 2018-05-14 DIAGNOSIS — Z87891 Personal history of nicotine dependence: Secondary | ICD-10-CM | POA: Diagnosis not present

## 2018-05-14 DIAGNOSIS — I712 Thoracic aortic aneurysm, without rupture: Secondary | ICD-10-CM | POA: Insufficient documentation

## 2018-05-14 DIAGNOSIS — I700 Atherosclerosis of aorta: Secondary | ICD-10-CM | POA: Insufficient documentation

## 2018-05-14 DIAGNOSIS — Z122 Encounter for screening for malignant neoplasm of respiratory organs: Secondary | ICD-10-CM | POA: Diagnosis not present

## 2018-05-14 DIAGNOSIS — J439 Emphysema, unspecified: Secondary | ICD-10-CM | POA: Diagnosis not present

## 2018-05-14 DIAGNOSIS — I2510 Atherosclerotic heart disease of native coronary artery without angina pectoris: Secondary | ICD-10-CM | POA: Insufficient documentation

## 2018-05-18 ENCOUNTER — Encounter: Payer: Self-pay | Admitting: *Deleted

## 2018-05-27 DIAGNOSIS — M2578 Osteophyte, vertebrae: Secondary | ICD-10-CM | POA: Diagnosis not present

## 2018-05-27 DIAGNOSIS — G9520 Unspecified cord compression: Secondary | ICD-10-CM | POA: Diagnosis not present

## 2018-05-27 DIAGNOSIS — M5021 Other cervical disc displacement,  high cervical region: Secondary | ICD-10-CM | POA: Diagnosis not present

## 2018-05-28 ENCOUNTER — Other Ambulatory Visit: Payer: Self-pay

## 2018-05-28 NOTE — Telephone Encounter (Signed)
Copied from CRM [id number]. 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 (In [facility]) pt can barely walk on his feet due to the pain. Would like to know if anyone that can treat these 2 things. (wife not so sure about the Parkinson dx) Pt has been seeing a neurologist and has next appt 7/09. Pt had a MRI yesterday ordered form his neurologist.  Wife would like to know if there is a specialist that can address his feet especially. Advised pt to have the reports from the podiatrist sent to Dr [name]. Wife wanted to ask Dr [name] opinion before they made appt. Would like you to know the Lyrica is not working.

## 2018-05-28 NOTE — Telephone Encounter (Signed)
Both issues best cared for by neurologist.. Check to make sure he is seeing neurologist and not neurosurgeon.  We can possibly try higher dose of lyrica.. Verify how much is he on?

## 2018-05-29 NOTE — Telephone Encounter (Signed)
Start with increase of lyrica to 150 mg BID

## 2018-05-29 NOTE — Telephone Encounter (Signed)
Start with increase of Lyrica to 150 mg BID.. If not improving pain after 2 weeks.. Let me know and we can have him follow up or  make a referral to neurologist.

## 2018-05-29 NOTE — Telephone Encounter (Signed)
Spoke with [name] (wife).  She states [name] is scheduled to see Dr. Adriana Simasook with [facility].  He is a Midwifeneurosurgeon.  He does not have a neurologist.  She states nothing is touching the pain in his feet.  She thinks he is taking the Lyrica as prescribed by Dr. [name] which is 100 mg twice a day.

## 2018-06-01 MED ORDER — PREGABALIN 150 MG PO CAPS: 150.0000 mg | ORAL_CAPSULE | Freq: Two times a day (BID) | ORAL | 0 refills | Status: DC

## 2018-06-01 NOTE — Telephone Encounter (Addendum)
Left message for [name] that Dr. [name] recommends increasing Donald Roberts's Lyrica to 150 mg twice a day.  If pain is not improving in 2 weeks, let us know and she will refer him to a neurologist.  New prescription for Lyrica 150 mg sent to [facility] by Dr. [name].

## 2018-06-09 DIAGNOSIS — M5412 Radiculopathy, cervical region: Secondary | ICD-10-CM | POA: Diagnosis not present

## 2018-06-09 DIAGNOSIS — Z981 Arthrodesis status: Secondary | ICD-10-CM | POA: Diagnosis not present

## 2018-06-17 DIAGNOSIS — M79671 Pain in right foot: Secondary | ICD-10-CM | POA: Diagnosis not present

## 2018-06-17 DIAGNOSIS — S92301K Fracture of unspecified metatarsal bone(s), right foot, subsequent encounter for fracture with nonunion: Secondary | ICD-10-CM | POA: Diagnosis not present

## 2018-06-17 DIAGNOSIS — G603 Idiopathic progressive neuropathy: Secondary | ICD-10-CM | POA: Diagnosis not present

## 2018-06-17 DIAGNOSIS — M79672 Pain in left foot: Secondary | ICD-10-CM | POA: Diagnosis not present

## 2018-06-22 ENCOUNTER — Telehealth: Payer: Self-pay | Admitting: Family Medicine

## 2018-06-22 NOTE — Telephone Encounter (Signed)
Copied from CRM [id number]. Topic: Quick Communication - See Telephone Encounter >> Jun 22, 2018  2:02 PM Louie BunPalacios Medina, Rosey Batheresa D wrote: CRM for notification. See Telephone encounter for: 06/22/18. [place] with Dr. Gwendolyn [facility]Walr[facility] dentistry office would like to talk to Dr. [name] about sedating patient and would like to get the ok from PCP. Their number to call back is [phone] and patients appt is tomorrow.

## 2018-06-22 NOTE — Telephone Encounter (Signed)
Pt last annual 04/17/18. Pt has appt 04/29/19 for annual exam. Please advise.

## 2018-06-23 NOTE — Telephone Encounter (Signed)
Spoke with [place]  Pre op 7/22 dr Gwendolyn GrantWalrond office   Pt is scheduled 7/24 @ 8:30 for conscious extractions  Dr Gwendolyn Grantwalrond is wanting approval from dr [name] for  conscious  sedation for dental process  Extractions  Best number [phone] @ [place] The go to lunch from 1-2

## 2018-06-23 NOTE — Telephone Encounter (Signed)
Noted  

## 2018-06-23 NOTE — Telephone Encounter (Signed)
I have no concern for pt about the conscious sedation except for the apin meds he is on.. They may want to get okay from pain center that is prescribing the medication.

## 2018-06-23 NOTE — Telephone Encounter (Signed)
Spoke with [place] informing her of Dr. Daphine DeutscherBedsole's recommendations. She will reach out to the pain management clinic. Nothing further needed at this time.

## 2018-06-23 NOTE — Telephone Encounter (Signed)
Patient is scheduled to come in today at 2pm for pre op and has appointment tomorrow to get the dental work done. [place] would like a call back as soon as possible. [phone]

## 2018-06-29 DIAGNOSIS — M47812 Spondylosis without myelopathy or radiculopathy, cervical region: Secondary | ICD-10-CM | POA: Diagnosis not present

## 2018-07-02 DIAGNOSIS — G603 Idiopathic progressive neuropathy: Secondary | ICD-10-CM | POA: Diagnosis not present

## 2018-07-02 DIAGNOSIS — M79671 Pain in right foot: Secondary | ICD-10-CM | POA: Diagnosis not present

## 2018-07-02 DIAGNOSIS — M79672 Pain in left foot: Secondary | ICD-10-CM | POA: Diagnosis not present

## 2018-07-02 DIAGNOSIS — S92301K Fracture of unspecified metatarsal bone(s), right foot, subsequent encounter for fracture with nonunion: Secondary | ICD-10-CM | POA: Diagnosis not present

## 2018-07-22 ENCOUNTER — Other Ambulatory Visit: Payer: Self-pay | Admitting: Family Medicine

## 2018-07-23 NOTE — Telephone Encounter (Signed)
Last office visit 04/17/2018 for CPE.. Last refilled 06/01/2018 for #60 with no refills.  Ok to refill?

## 2018-07-27 ENCOUNTER — Other Ambulatory Visit: Payer: Self-pay | Admitting: Physical Medicine and Rehabilitation

## 2018-07-27 DIAGNOSIS — M545 Low back pain, unspecified: Secondary | ICD-10-CM

## 2018-07-27 DIAGNOSIS — G8929 Other chronic pain: Secondary | ICD-10-CM

## 2018-07-27 DIAGNOSIS — M5136 Other intervertebral disc degeneration, lumbar region: Secondary | ICD-10-CM | POA: Diagnosis not present

## 2018-07-28 ENCOUNTER — Other Ambulatory Visit: Payer: Self-pay | Admitting: Physical Medicine and Rehabilitation

## 2018-07-28 DIAGNOSIS — M79671 Pain in right foot: Secondary | ICD-10-CM | POA: Diagnosis not present

## 2018-07-28 DIAGNOSIS — M79672 Pain in left foot: Secondary | ICD-10-CM | POA: Diagnosis not present

## 2018-07-28 DIAGNOSIS — S92301K Fracture of unspecified metatarsal bone(s), right foot, subsequent encounter for fracture with nonunion: Secondary | ICD-10-CM | POA: Diagnosis not present

## 2018-07-28 DIAGNOSIS — G603 Idiopathic progressive neuropathy: Secondary | ICD-10-CM | POA: Diagnosis not present

## 2018-07-28 DIAGNOSIS — M545 Low back pain: Principal | ICD-10-CM

## 2018-07-28 DIAGNOSIS — G8929 Other chronic pain: Secondary | ICD-10-CM

## 2018-08-18 ENCOUNTER — Ambulatory Visit: Payer: BLUE CROSS/BLUE SHIELD

## 2018-08-18 ENCOUNTER — Ambulatory Visit
Admission: RE | Admit: 2018-08-18 | Discharge: 2018-08-18 | Disposition: A | Discharge status: Home / Self Care | Payer: Medicare Other | Source: Ambulatory Visit | Attending: Physical Medicine and Rehabilitation | Admitting: Physical Medicine and Rehabilitation

## 2018-08-18 DIAGNOSIS — M545 Low back pain, unspecified: Secondary | ICD-10-CM

## 2018-08-18 DIAGNOSIS — G8929 Other chronic pain: Secondary | ICD-10-CM

## 2018-08-24 DIAGNOSIS — M545 Low back pain: Secondary | ICD-10-CM | POA: Diagnosis not present

## 2018-08-24 DIAGNOSIS — M47816 Spondylosis without myelopathy or radiculopathy, lumbar region: Secondary | ICD-10-CM | POA: Diagnosis not present

## 2018-08-24 DIAGNOSIS — G8929 Other chronic pain: Secondary | ICD-10-CM | POA: Diagnosis not present

## 2018-08-24 DIAGNOSIS — M5136 Other intervertebral disc degeneration, lumbar region: Secondary | ICD-10-CM | POA: Diagnosis not present

## 2018-08-24 DIAGNOSIS — M47812 Spondylosis without myelopathy or radiculopathy, cervical region: Secondary | ICD-10-CM | POA: Diagnosis not present

## 2018-09-11 DIAGNOSIS — M47816 Spondylosis without myelopathy or radiculopathy, lumbar region: Secondary | ICD-10-CM | POA: Diagnosis not present

## 2018-09-15 DIAGNOSIS — G603 Idiopathic progressive neuropathy: Secondary | ICD-10-CM | POA: Diagnosis not present

## 2018-09-15 DIAGNOSIS — S92301K Fracture of unspecified metatarsal bone(s), right foot, subsequent encounter for fracture with nonunion: Secondary | ICD-10-CM | POA: Diagnosis not present

## 2018-09-15 DIAGNOSIS — M79671 Pain in right foot: Secondary | ICD-10-CM | POA: Diagnosis not present

## 2018-10-07 DIAGNOSIS — M47816 Spondylosis without myelopathy or radiculopathy, lumbar region: Secondary | ICD-10-CM | POA: Diagnosis not present

## 2018-10-07 DIAGNOSIS — M5136 Other intervertebral disc degeneration, lumbar region: Secondary | ICD-10-CM | POA: Diagnosis not present

## 2018-10-07 DIAGNOSIS — G8929 Other chronic pain: Secondary | ICD-10-CM | POA: Diagnosis not present

## 2018-10-07 DIAGNOSIS — M545 Low back pain: Secondary | ICD-10-CM | POA: Diagnosis not present

## 2018-10-08 DIAGNOSIS — I25110 Atherosclerotic heart disease of native coronary artery with unstable angina pectoris: Secondary | ICD-10-CM | POA: Diagnosis not present

## 2018-10-08 DIAGNOSIS — I10 Essential (primary) hypertension: Secondary | ICD-10-CM | POA: Diagnosis not present

## 2018-10-08 DIAGNOSIS — R072 Precordial pain: Secondary | ICD-10-CM | POA: Diagnosis not present

## 2018-10-16 ENCOUNTER — Other Ambulatory Visit: Payer: Self-pay | Admitting: Family Medicine

## 2018-10-28 ENCOUNTER — Observation Stay (HOSPITAL_COMMUNITY)
Admission: AD | Admit: 2018-10-28 | Discharge: 2018-10-29 | Disposition: A | Discharge status: Home / Self Care | Payer: BLUE CROSS/BLUE SHIELD | Source: Ambulatory Visit | Attending: Cardiovascular Disease | Admitting: Cardiovascular Disease

## 2018-10-28 ENCOUNTER — Encounter (HOSPITAL_COMMUNITY): Payer: Self-pay | Admitting: *Deleted

## 2018-10-28 ENCOUNTER — Other Ambulatory Visit: Payer: Self-pay

## 2018-10-28 DIAGNOSIS — E785 Hyperlipidemia, unspecified: Secondary | ICD-10-CM | POA: Diagnosis present

## 2018-10-28 DIAGNOSIS — I2510 Atherosclerotic heart disease of native coronary artery without angina pectoris: Secondary | ICD-10-CM | POA: Diagnosis present

## 2018-10-28 DIAGNOSIS — R072 Precordial pain: Principal | ICD-10-CM | POA: Diagnosis present

## 2018-10-28 DIAGNOSIS — I249 Acute ischemic heart disease, unspecified: Secondary | ICD-10-CM | POA: Diagnosis present

## 2018-10-28 DIAGNOSIS — Z87891 Personal history of nicotine dependence: Secondary | ICD-10-CM | POA: Insufficient documentation

## 2018-10-28 DIAGNOSIS — I10 Essential (primary) hypertension: Secondary | ICD-10-CM | POA: Diagnosis not present

## 2018-10-28 DIAGNOSIS — R079 Chest pain, unspecified: Secondary | ICD-10-CM | POA: Diagnosis not present

## 2018-10-28 DIAGNOSIS — Z79899 Other long term (current) drug therapy: Secondary | ICD-10-CM | POA: Diagnosis not present

## 2018-10-28 DIAGNOSIS — I25110 Atherosclerotic heart disease of native coronary artery with unstable angina pectoris: Secondary | ICD-10-CM | POA: Diagnosis not present

## 2018-10-28 LAB — COMPREHENSIVE METABOLIC PANEL
ALT: 14 U/L (ref 0–44)
AST: 18 U/L (ref 15–41)
Albumin: 3.4 g/dL — ABNORMAL LOW (ref 3.5–5.0)
Alkaline Phosphatase: 52 U/L (ref 38–126)
Anion gap: 9 (ref 5–15)
BUN: 10 mg/dL (ref 8–23)
CO2: 22 mmol/L (ref 22–32)
Calcium: 8.7 mg/dL — ABNORMAL LOW (ref 8.9–10.3)
Chloride: 106 mmol/L (ref 98–111)
Creatinine, Ser: 0.86 mg/dL (ref 0.61–1.24)
GFR calc Af Amer: >60 mL/min (ref >60)
GFR calc non Af Amer: >60 mL/min (ref >60)
Glucose, Bld: 85 mg/dL (ref 70–99)
Potassium: 4 mmol/L (ref 3.5–5.1)
Sodium: 137 mmol/L (ref 135–145)
Total Bilirubin: 0.8 mg/dL (ref 0.3–1.2)
Total Protein: 6.6 g/dL (ref 6.5–8.1)

## 2018-10-28 LAB — CBC WITH DIFFERENTIAL/PLATELET
Abs Immature Granulocytes: 0.04 10*3/uL (ref 0.00–0.07)
Basophils Absolute: 0.1 10*3/uL (ref 0.0–0.1)
Basophils Relative: 1 %
Eosinophils Absolute: 0.2 10*3/uL (ref 0.0–0.5)
Eosinophils Relative: 2 %
HCT: 41.7 % (ref 39.0–52.0)
Hemoglobin: 13.4 g/dL (ref 13.0–17.0)
Immature Granulocytes: 0 %
Lymphocytes Relative: 23 %
Lymphs Abs: 2.3 10*3/uL (ref 0.7–4.0)
MCH: 27.7 pg (ref 26.0–34.0)
MCHC: 32.1 g/dL (ref 30.0–36.0)
MCV: 86.3 fL (ref 80.0–100.0)
Monocytes Absolute: 0.9 10*3/uL (ref 0.1–1.0)
Monocytes Relative: 9 %
Neutro Abs: 6.4 10*3/uL (ref 1.7–7.7)
Neutrophils Relative %: 65 %
Platelets: 252 10*3/uL (ref 150–400)
RBC: 4.83 MIL/uL (ref 4.22–5.81)
RDW: 14 % (ref 11.5–15.5)
WBC: 9.8 10*3/uL (ref 4.0–10.5)
nRBC: 0 % (ref 0.0–0.2)

## 2018-10-28 LAB — TROPONIN I: Troponin I: <0.03 ng/mL (ref <0.03)

## 2018-10-28 MED ORDER — RAMIPRIL 5 MG PO CAPS
5.0000 mg | ORAL_CAPSULE | Freq: Every day | ORAL | Status: DC
  Filled 2018-10-28: qty 1

## 2018-10-28 MED ORDER — HEPARIN (PORCINE) 25000 UT/250ML-% IV SOLN
1150.0000 [IU]/h | INTRAVENOUS | Status: DC
  Administered 2018-10-28: 1150 [IU]/h via INTRAVENOUS
  Filled 2018-10-28: qty 250

## 2018-10-28 MED ORDER — FAMOTIDINE 20 MG PO TABS
20.0000 mg | ORAL_TABLET | Freq: Two times a day (BID) | ORAL | Status: DC
  Administered 2018-10-28 – 2018-10-29 (×2): 20 mg via ORAL
  Filled 2018-10-28 (×2): qty 1

## 2018-10-28 MED ORDER — ONDANSETRON HCL 4 MG/2ML IJ SOLN: 4.0000 mg | Freq: Four times a day (QID) | INTRAMUSCULAR | Status: DC | PRN

## 2018-10-28 MED ORDER — ATORVASTATIN CALCIUM 40 MG PO TABS
40.0000 mg | ORAL_TABLET | Freq: Every day | ORAL | Status: DC
  Filled 2018-10-28: qty 1

## 2018-10-28 MED ORDER — ASPIRIN 300 MG RE SUPP: 300.0000 mg | RECTAL | Status: AC

## 2018-10-28 MED ORDER — METOPROLOL TARTRATE 25 MG PO TABS
25.0000 mg | ORAL_TABLET | Freq: Two times a day (BID) | ORAL | Status: DC
  Administered 2018-10-28: 25 mg via ORAL
  Filled 2018-10-28: qty 1

## 2018-10-28 MED ORDER — PREGABALIN 75 MG PO CAPS
150.0000 mg | ORAL_CAPSULE | Freq: Two times a day (BID) | ORAL | Status: DC
  Administered 2018-10-28 – 2018-10-29 (×2): 150 mg via ORAL
  Filled 2018-10-28 (×2): qty 2

## 2018-10-28 MED ORDER — HEPARIN BOLUS VIA INFUSION
4000.0000 [IU] | Freq: Once | INTRAVENOUS | Status: AC
  Administered 2018-10-28: 4000 [IU] via INTRAVENOUS
  Filled 2018-10-28: qty 4000

## 2018-10-28 MED ORDER — CALCIUM CARBONATE ANTACID 500 MG PO CHEW
800.0000 mg | CHEWABLE_TABLET | Freq: Two times a day (BID) | ORAL | Status: DC
  Administered 2018-10-28: 800 mg via ORAL
  Filled 2018-10-28 (×2): qty 4

## 2018-10-28 MED ORDER — FAMOTIDINE-CA CARB-MAG HYDROX 10-800-165 MG PO CHEW: 1.0000 | CHEWABLE_TABLET | Freq: Every day | ORAL | Status: DC

## 2018-10-28 MED ORDER — ASPIRIN 81 MG PO CHEW
324.0000 mg | CHEWABLE_TABLET | ORAL | Status: AC
  Administered 2018-10-28: 324 mg via ORAL
  Filled 2018-10-28: qty 4

## 2018-10-28 MED ORDER — NITROGLYCERIN 0.4 MG SL SUBL: 0.4000 mg | SUBLINGUAL_TABLET | SUBLINGUAL | Status: DC | PRN

## 2018-10-28 MED ORDER — ACETAMINOPHEN 325 MG PO TABS: 650.0000 mg | ORAL_TABLET | ORAL | Status: DC | PRN

## 2018-10-28 MED ORDER — MAGNESIUM CHLORIDE 64 MG PO TBEC
1.0000 | DELAYED_RELEASE_TABLET | Freq: Two times a day (BID) | ORAL | Status: DC
  Administered 2018-10-28: 64 mg via ORAL
  Filled 2018-10-28 (×2): qty 1

## 2018-10-28 MED ORDER — ASPIRIN EC 81 MG PO TBEC
81.0000 mg | DELAYED_RELEASE_TABLET | Freq: Every day | ORAL | Status: DC
  Filled 2018-10-28: qty 1

## 2018-10-28 NOTE — Progress Notes (Addendum)
ANTICOAGULATION CONSULT NOTE - Initial Consult  Pharmacy Consult for IV heparin Indication: chest pain/ACS  No Known Allergies  Patient Measurements: Height: 5\' 10"  (177.8 cm) Weight: 180 lb (81.6 kg) IBW/kg (Calculated) : 73 Heparin Dosing Weight: 81.6 kg  Vital Signs: Temp: 98.2 F (36.8 C) (11/27 2112) Temp Source: Oral (11/27 2112) BP: 112/63 (11/27 2112) Pulse Rate: 50 (11/27 2112)  Labs: Recent Labs    10/28/18 1612  HGB 13.4  HCT 41.7  PLT 252  CREATININE 0.86  TROPONINI <0.03    Estimated Creatinine Clearance: 87.2 mL/min (by C-G formula based on SCr of 0.86 mg/dL).   Medical History: Past Medical History:  Diagnosis Date  . Abdominal pain, generalized 07/18/2008  . ALLERGIC RHINITIS 07/18/2008  . Allergy   . Back pain, chronic 10/01/2011  . Carpal tunnel syndrome on left   . Chronic low back pain 12/25/2012  . COLONIC POLYPS, HX OF 07/18/2008  . CORONARY ARTERY DISEASE 07/18/2008  . Degenerative arthritis of right knee 10/01/2011  . DIVERTICULOSIS, COLON 07/18/2008  . Gross hematuria 07/18/2008  . Headache(784.0)    Hx: of Migraines as a child  . Heart murmur   . HYPERLIPIDEMIA 07/18/2008  . Hypertension   . TOBACCO ABUSE 04/06/2009    Medications:  Medications Prior to Admission  Medication Sig Dispense Refill Last Dose  . famotidine-calcium carbonate-magnesium hydroxide (PEPCID COMPLETE) 10-800-165 MG CHEW chewable tablet Chew 1 tablet by mouth daily.    10/28/2018 at Unknown time  . nitroGLYCERIN (NITROSTAT) 0.4 MG SL tablet Place 0.4 mg under the tongue every 5 (five) minutes as needed for chest pain.   unk at prn  . pravastatin (PRAVACHOL) 40 MG tablet Take 40 mg by mouth daily.   10/28/2018 at Unknown time  . pregabalin (LYRICA) 150 MG capsule TAKE 1 CAPSULE BY MOUTH 2 TIMES DAILY. (Patient taking differently: Take 150 mg by mouth 2 (two) times daily. ) 60 capsule 2 10/28/2018 at am  . ramipril (ALTACE) 5 MG capsule Take 5 mg by mouth daily.    10/28/2018 at Unknown time  . Oxycodone HCl 10 MG TABS Take 1 tablet (10 mg total) by mouth every 6 (six) hours. Max: 4/day. 120 tablet 0 Taking    [name] admitted directly from Dr. Roseanne KaufmanKadakia's office for R/o ACS.  Pharmacy asked to begin anticoagulation with IV heparin.  No anticoagulants noted PTA, no history of bleeding per chart review.   Goal of Therapy:  Heparin level 0.3-0.7 units/ml Monitor platelets by anticoagulation protocol: Yes   Plan:  1. Start IV heparin with bolus of 4000 units x 1 2. Then start heparin gtt at 1150 units/hr. 3. Check heparin level in 6 hrs. 4. Daily heparin level and CBC.  Donald DownerJessica Thorn Roberts, Pharm D, BCPS, [facility] Clinical Pharmacist Phone [phone]  10/28/2018 10:16 PM

## 2018-10-28 NOTE — H&P (Signed)
Referring Physician:  EDENILSON AUSTAD is an 67 y.o. male.                       Chief Complaint: Chest pain  HPI: 67 year old male with PMH of CAD, hypertension, hyperlipidemia, H/O tobacco use disorder and low back pain has recurrent chest pain and SL NTG use. He denies fever, cough or sweating spell.   Past Medical History:  Diagnosis Date  . Abdominal pain, generalized 07/18/2008  . ALLERGIC RHINITIS 07/18/2008  . Allergy   . Back pain, chronic 10/01/2011  . Carpal tunnel syndrome on left   . Chronic low back pain 12/25/2012  . COLONIC POLYPS, HX OF 07/18/2008  . CORONARY ARTERY DISEASE 07/18/2008  . Degenerative arthritis of right knee 10/01/2011  . DIVERTICULOSIS, COLON 07/18/2008  . Gross hematuria 07/18/2008  . Headache(784.0)    Hx: of Migraines as a child  . Heart murmur   . HYPERLIPIDEMIA 07/18/2008  . Hypertension   . TOBACCO ABUSE 04/06/2009      Past Surgical History:  Procedure Laterality Date  . ANTERIOR CERVICAL DECOMP/DISCECTOMY FUSION N/A 09/10/2013   Procedure: ANTERIOR CERVICAL DECOMPRESSION/DISCECTOMY FUSION CERVICAL FOUR-FIVE,CERVICAL FIVE-SIX WITH PLATING AND BONE GRAFT;  Surgeon: Karn Cassis, MD;  Location: [facility];  Service: Neurosurgery;  Laterality: N/A;  . ANTERIOR CERVICAL DECOMP/DISCECTOMY FUSION N/A 03/14/2015   Procedure: ANTERIOR CERVICAL DECOMPRESSION/DISCECTOMY FUSION  CERVICAL TWO-THREE;  Surgeon: Hilda Lias, MD;  Location: [facility];  Service: Neurosurgery;  Laterality: N/A;  . CARDIAC CATHETERIZATION    . COLONOSCOPY    . COLONOSCOPY W/ BIOPSIES AND POLYPECTOMY     Hx: of  . lumbar surgury     dr botero/ns; 3 level fusion per pt  . POLYPECTOMY    . s/p cervical disease x 2      Family History  Problem Relation Age of Onset  . Heart disease Brother   . Heart disease Mother   . Heart disease Other   . Colon cancer Neg Hx   . Colon polyps Neg Hx   . Esophageal cancer Neg Hx   . Rectal cancer Neg Hx   . Stomach cancer Neg Hx     Social History:  reports that he quit smoking about 22 months ago. His smoking use included e-cigarettes. He has a 40.00 pack-year smoking history. He has never used smokeless tobacco. He reports that he drinks alcohol. He reports that he does not use drugs.  Allergies: No Known Allergies  Medications Prior to Admission  Medication Sig Dispense Refill  . famotidine-calcium carbonate-magnesium hydroxide (PEPCID COMPLETE) 10-800-165 MG CHEW chewable tablet Chew 1 tablet by mouth daily.     . nitroGLYCERIN (NITROSTAT) 0.4 MG SL tablet Place 0.4 mg under the tongue every 5 (five) minutes as needed for chest pain.    . pravastatin (PRAVACHOL) 40 MG tablet Take 40 mg by mouth daily.    . pregabalin (LYRICA) 150 MG capsule TAKE 1 CAPSULE BY MOUTH 2 TIMES DAILY. (Patient taking differently: Take 150 mg by mouth 2 (two) times daily. ) 60 capsule 2  . ramipril (ALTACE) 5 MG capsule Take 5 mg by mouth daily.    . Oxycodone HCl 10 MG TABS Take 1 tablet (10 mg total) by mouth every 6 (six) hours. Max: 4/day. 120 tablet 0    Results for orders placed or performed during the hospital encounter of 10/28/18 (from the past 48 hour(s))  Comprehensive metabolic panel  Status: Abnormal   Collection Time: 10/28/18  4:12 PM  Result Value Ref Range   Sodium 137 135 - 145 mmol/L   Potassium 4.0 3.5 - 5.1 mmol/L   Chloride 106 98 - 111 mmol/L   CO2 22 22 - 32 mmol/L   Glucose, Bld 85 70 - 99 mg/dL   BUN 10 8 - 23 mg/dL   Creatinine, Ser 1.610.86 0.61 - 1.24 mg/dL   Calcium 8.7 (L) 8.9 - 10.3 mg/dL   Total Protein 6.6 6.5 - 8.1 g/dL   Albumin 3.4 (L) 3.5 - 5.0 g/dL   AST 18 15 - 41 U/L   ALT 14 0 - 44 U/L   Alkaline Phosphatase 52 38 - 126 U/L   Total Bilirubin 0.8 0.3 - 1.2 mg/dL   GFR calc non Af Amer >60 >60 mL/min   GFR calc Af Amer >60 >60 mL/min   Anion gap 9 5 - 15    Comment: Performed at [facility], [address] [place], [place] [address]  Troponin I - Now Then Q6H     Status: None    Collection Time: 10/28/18  4:12 PM  Result Value Ref Range   Troponin I <0.03 <0.03 ng/mL    Comment: Performed at [facility], [address] [place], [place] [address]  CBC WITH DIFFERENTIAL     Status: None   Collection Time: 10/28/18  4:12 PM  Result Value Ref Range   WBC 9.8 4.0 - 10.5 K/uL   RBC 4.83 4.22 - 5.81 MIL/uL   Hemoglobin 13.4 13.0 - 17.0 g/dL   HCT 11.941.7 14.739.0 - 82.952.0 %   MCV 86.3 80.0 - 100.0 fL   MCH 27.7 26.0 - 34.0 pg   MCHC 32.1 30.0 - 36.0 g/dL   RDW 56.214.0 13.011.5 - 86.515.5 %   Platelets 252 150 - 400 K/uL   nRBC 0.0 0.0 - 0.2 %   Neutrophils Relative % 65 %   Neutro Abs 6.4 1.7 - 7.7 K/uL   Lymphocytes Relative 23 %   Lymphs Abs 2.3 0.7 - 4.0 K/uL   Monocytes Relative 9 %   Monocytes Absolute 0.9 0.1 - 1.0 K/uL   Eosinophils Relative 2 %   Eosinophils Absolute 0.2 0.0 - 0.5 K/uL   Basophils Relative 1 %   Basophils Absolute 0.1 0.0 - 0.1 K/uL   Immature Granulocytes 0 %   Abs Immature Granulocytes 0.04 0.00 - 0.07 K/uL    Comment: Performed at [facility], [address] [place], [place] [address]   No results found.  Review Of Systems Constitutional: No fever, chills, weight loss or gain. Eyes: No vision change, wears glasses. No discharge or pain. Ears: No hearing loss, No tinnitus. Respiratory: No asthma, COPD, pneumonias. Positive shortness of breath. No hemoptysis. Cardiovascular: Positive chest pain, no palpitation, leg edema. Gastrointestinal: No nausea, vomiting, diarrhea, constipation. No GI bleed. No hepatitis. Genitourinary: No dysuria, hematuria, kidney stone. No incontinance. Neurological: No headache, stroke, seizures.  Psychiatry: No psych facility admission for anxiety, depression, suicide. No detox. Skin: No rash. Musculoskeletal: Positive joint pain, fibromyalgia. No neck pain, back pain. Lymphadenopathy: No lymphadenopathy. Hematology: No anemia or easy bruising.   Blood pressure 128/79, pulse (!) 51, temperature 97.7  F (36.5 C), temperature source Oral, resp. rate 12, height 5\' 10"  (1.778 m), weight 81.6 kg, SpO2 96 %. Body mass index is 25.83 kg/m. General appearance: alert, cooperative, appears stated age and no distress Head: Normocephalic,  atraumatic. Eyes: Hazel eyes, pink conjunctiva, corneas clear. PERRL, EOM's intact. Neck: No adenopathy, no carotid bruit, no JVD, supple, symmetrical, trachea midline and thyroid not enlarged. Resp: Clear to auscultation bilaterally. Cardio: Regular rate and rhythm, S1, S2 normal, II/VI systolic murmur, no click, rub or gallop GI: Soft, non-tender; bowel sounds normal; no organomegaly. Extremities: No edema, cyanosis or clubbing. Skin: Warm and dry.  Neurologic: Alert and oriented X 3, normal strength. Normal coordination and gait.  Assessment/Plan Acute coronary syndrome CAD Hypertension Hyperlipidemia  Place in observation Troponin I q 6 hr. NM stress test v/s cardiac cath.  Ricki Rodriguez, MD  10/28/2018, 6:15 PM

## 2018-10-29 DIAGNOSIS — I2510 Atherosclerotic heart disease of native coronary artery without angina pectoris: Secondary | ICD-10-CM | POA: Diagnosis not present

## 2018-10-29 DIAGNOSIS — R079 Chest pain, unspecified: Secondary | ICD-10-CM | POA: Diagnosis not present

## 2018-10-29 DIAGNOSIS — I25110 Atherosclerotic heart disease of native coronary artery with unstable angina pectoris: Secondary | ICD-10-CM | POA: Diagnosis not present

## 2018-10-29 DIAGNOSIS — I10 Essential (primary) hypertension: Secondary | ICD-10-CM | POA: Diagnosis not present

## 2018-10-29 DIAGNOSIS — E785 Hyperlipidemia, unspecified: Secondary | ICD-10-CM | POA: Diagnosis not present

## 2018-10-29 DIAGNOSIS — Z87891 Personal history of nicotine dependence: Secondary | ICD-10-CM | POA: Diagnosis not present

## 2018-10-29 DIAGNOSIS — Z79899 Other long term (current) drug therapy: Secondary | ICD-10-CM | POA: Diagnosis not present

## 2018-10-29 DIAGNOSIS — R072 Precordial pain: Secondary | ICD-10-CM | POA: Diagnosis not present

## 2018-10-29 LAB — BASIC METABOLIC PANEL
Anion gap: 9 (ref 5–15)
BUN: 17 mg/dL (ref 8–23)
CO2: 22 mmol/L (ref 22–32)
Calcium: 8.8 mg/dL — ABNORMAL LOW (ref 8.9–10.3)
Chloride: 106 mmol/L (ref 98–111)
Creatinine, Ser: 1.02 mg/dL (ref 0.61–1.24)
GFR calc Af Amer: >60 mL/min (ref >60)
GFR calc non Af Amer: >60 mL/min (ref >60)
Glucose, Bld: 93 mg/dL (ref 70–99)
Potassium: 3.9 mmol/L (ref 3.5–5.1)
Sodium: 137 mmol/L (ref 135–145)

## 2018-10-29 LAB — CBC
HCT: 40.8 % (ref 39.0–52.0)
Hemoglobin: 12.9 g/dL — ABNORMAL LOW (ref 13.0–17.0)
MCH: 27.3 pg (ref 26.0–34.0)
MCHC: 31.6 g/dL (ref 30.0–36.0)
MCV: 86.3 fL (ref 80.0–100.0)
Platelets: 273 10*3/uL (ref 150–400)
RBC: 4.73 MIL/uL (ref 4.22–5.81)
RDW: 14.1 % (ref 11.5–15.5)
WBC: 8.5 10*3/uL (ref 4.0–10.5)
nRBC: 0 % (ref 0.0–0.2)

## 2018-10-29 LAB — LIPID PANEL
Cholesterol: 129 mg/dL (ref 0–200)
HDL: 43 mg/dL (ref >40)
LDL Cholesterol: 77 mg/dL (ref 0–99)
Total CHOL/HDL Ratio: 3 RATIO
Triglycerides: 44 mg/dL (ref <150)
VLDL: 9 mg/dL (ref 0–40)

## 2018-10-29 LAB — PROTIME-INR
INR: 1.13
Prothrombin Time: 14.4 seconds (ref 11.4–15.2)

## 2018-10-29 LAB — TROPONIN I
Troponin I: <0.03 ng/mL (ref <0.03)
Troponin I: <0.03 ng/mL (ref <0.03)

## 2018-10-29 LAB — HEPARIN LEVEL (UNFRACTIONATED): Heparin Unfractionated: 0.39 IU/mL (ref 0.30–0.70)

## 2018-10-29 LAB — HIV ANTIBODY (ROUTINE TESTING W REFLEX): HIV Screen 4th Generation wRfx: NONREACTIVE

## 2018-10-29 MED ORDER — ATORVASTATIN CALCIUM 40 MG PO TABS: 40.0000 mg | ORAL_TABLET | Freq: Every day | ORAL | 3 refills | Status: DC

## 2018-10-29 MED ORDER — AMLODIPINE BESYLATE 2.5 MG PO TABS: 2.5000 mg | ORAL_TABLET | Freq: Every day | ORAL | 1 refills | Status: DC

## 2018-10-29 MED ORDER — AMLODIPINE BESYLATE 2.5 MG PO TABS
2.5000 mg | ORAL_TABLET | Freq: Every day | ORAL | Status: DC
  Administered 2018-10-29: 2.5 mg via ORAL
  Filled 2018-10-29: qty 1

## 2018-10-29 MED ORDER — ASPIRIN 81 MG PO TBEC: 81.0000 mg | DELAYED_RELEASE_TABLET | Freq: Every day | ORAL | Status: DC

## 2018-10-29 NOTE — Progress Notes (Signed)
ANTICOAGULATION CONSULT NOTE   Pharmacy Consult for IV heparin Indication: chest pain/ACS  No Known Allergies  Patient Measurements: Height: 5\' 10"  (177.8 cm) Weight: 180 lb (81.6 kg) IBW/kg (Calculated) : 73 Heparin Dosing Weight: 81.6 kg  Vital Signs: Temp: 98.2 F (36.8 C) (11/27 2112) Temp Source: Oral (11/27 2112) BP: 112/63 (11/27 2112) Pulse Rate: 50 (11/27 2112)  Labs: Recent Labs    10/28/18 1612 10/28/18 2309  HGB 13.4  --   HCT 41.7  --   PLT 252  --   HEPARINUNFRC  --  0.39  CREATININE 0.86  --   TROPONINI <0.03  --     Estimated Creatinine Clearance: 87.2 mL/min (by C-G formula based on SCr of 0.86 mg/dL).   [name] admitted directly from Dr. Roseanne KaufmanKadakia's office for R/o ACS.  Pharmacy asked to begin anticoagulation with IV heparin.  No anticoagulants noted PTA, no history of bleeding per chart review. Heparin level 0.39 units/ml   Goal of Therapy:  Heparin level 0.3-0.7 units/ml Monitor platelets by anticoagulation protocol: Yes   Plan:  Continue heparin gtt at 1150 units/hr. Daily heparin level and CBC Monitor for bleeding complications  Thanks for allowing pharmacy to be a part of this patient's care.  Talbert CageLora Kacia Halley, PharmD Clinical Pharmacist

## 2018-10-29 NOTE — Discharge Summary (Signed)
Physician Discharge Summary  Patient ID: Donald Roberts MRN: [id number] DOB/AGE: 1951/01/14 67 y.o.  Admit date: 10/28/2018 Discharge date: 10/29/2018  Admission Diagnoses: Acute coronary syndrome CAD Hypertension Hyperlipidemia  Discharge Diagnoses:  Active Problems:   Chest pain, precordial   Hyperlipidemia   CAD (coronary artery disease)   Hypertension  Discharged Condition: fair  Hospital Course: 67 year old male with recurrent chest pain has increased SL NTG use. His troponin I levels were normal. He prefers cardiac cath over nuclear stress test as he has known CAD. He wishes to go home for holiday and have further work up as OP. His low heart rate precludes B-blocker use. He will see me in one week and Primary care physician in 1 month.  Consults: cardiology  Significant Diagnostic Studies: labs: Normal CBC, BMET and Troponin I levels. Lipid panel was near normal.  EKG-sinus bradycardia.  Treatments: cardiac meds: ramipril (Altace), aspirin and amlodipine.  Discharge Exam: Blood pressure 121/82, pulse (!) 50, temperature (!) 97.4 F (36.3 C), temperature source Oral, resp. rate 20, height 5\' 10"  (1.778 m), weight 81.9 kg, SpO2 98 %. General appearance: alert, cooperative and appears stated age. Head: Normocephalic, atraumatic. Eyes: Hazel eyes, pink conjunctiva, corneas clear. PERRL, EOM's intact.  Neck: No adenopathy, no carotid bruit, no JVD, supple, symmetrical, trachea midline and thyroid not enlarged. Resp: Clear to auscultation bilaterally. Cardio: Regular rate and rhythm, S1, S2 normal, II/VI systolic murmur, no click, rub or gallop. GI: Soft, non-tender; bowel sounds normal; no organomegaly. Extremities: No edema, cyanosis or clubbing. Skin: Warm and dry.  Neurologic: Alert and oriented X 3, normal strength and tone. Normal coordination and gait.  Disposition: Discharge disposition: 01-Home or Self Care        Allergies as of 10/29/2018   No Known  Allergies     Medication List    STOP taking these medications   pravastatin 40 MG tablet Commonly known as:  PRAVACHOL     TAKE these medications   amLODipine 2.5 MG tablet Commonly known as:  NORVASC Take 1 tablet (2.5 mg total) by mouth daily.   aspirin 81 MG EC tablet Take 1 tablet (81 mg total) by mouth daily.   atorvastatin 40 MG tablet Commonly known as:  LIPITOR Take 1 tablet (40 mg total) by mouth daily at 6 PM.   famotidine-calcium carbonate-magnesium hydroxide 10-800-165 MG chewable tablet Commonly known as:  PEPCID COMPLETE Chew 1 tablet by mouth daily.   nitroGLYCERIN 0.4 MG SL tablet Commonly known as:  NITROSTAT Place 0.4 mg under the tongue every 5 (five) minutes as needed for chest pain.   Oxycodone HCl 10 MG Tabs Take 1 tablet (10 mg total) by mouth every 6 (six) hours. Max: 4/day.   pregabalin 150 MG capsule Commonly known as:  LYRICA TAKE 1 CAPSULE BY MOUTH 2 TIMES DAILY. What changed:  See the new instructions.   ramipril 5 MG capsule Commonly known as:  ALTACE Take 5 mg by mouth daily.      Follow-up Information    Excell Seltzer, MD. Schedule an appointment as soon as possible for a visit in 1 month(s).   Specialty:  Family Medicine Contact information: [address] [place] [address] [phone]        Orpah Cobb, MD. Schedule an appointment as soon as possible for a visit in 1 week(s).   Specialty:  Cardiology Contact information: [address] [place] [address] [phone]  Signed: Ricki Rodriguezjay S Emmajane Altamura 10/29/2018, 9:37 AM

## 2018-11-02 ENCOUNTER — Telehealth: Payer: Self-pay | Admitting: *Deleted

## 2018-11-02 NOTE — Telephone Encounter (Signed)
Please call patient and schedule an appointment to see Dr. [name] a month from his hospital stay. This will be a hospital follow-up.

## 2018-11-04 ENCOUNTER — Encounter (HOSPITAL_COMMUNITY): Payer: Self-pay | Admitting: Cardiovascular Disease

## 2018-11-04 ENCOUNTER — Ambulatory Visit (HOSPITAL_COMMUNITY)
Admission: RE | Admit: 2018-11-04 | Discharge: 2018-11-04 | Disposition: A | Discharge status: Home / Self Care | Payer: BLUE CROSS/BLUE SHIELD | Source: Ambulatory Visit | Attending: Cardiovascular Disease | Admitting: Cardiovascular Disease

## 2018-11-04 ENCOUNTER — Other Ambulatory Visit: Payer: Self-pay

## 2018-11-04 ENCOUNTER — Encounter (HOSPITAL_COMMUNITY)
Admission: RE | Disposition: A | Discharge status: Home / Self Care | Payer: Self-pay | Source: Ambulatory Visit | Attending: Cardiovascular Disease

## 2018-11-04 DIAGNOSIS — I249 Acute ischemic heart disease, unspecified: Secondary | ICD-10-CM | POA: Diagnosis present

## 2018-11-04 DIAGNOSIS — Z9889 Other specified postprocedural states: Secondary | ICD-10-CM | POA: Diagnosis not present

## 2018-11-04 DIAGNOSIS — Z87891 Personal history of nicotine dependence: Secondary | ICD-10-CM | POA: Insufficient documentation

## 2018-11-04 DIAGNOSIS — E785 Hyperlipidemia, unspecified: Secondary | ICD-10-CM | POA: Diagnosis not present

## 2018-11-04 DIAGNOSIS — I2510 Atherosclerotic heart disease of native coronary artery without angina pectoris: Secondary | ICD-10-CM | POA: Insufficient documentation

## 2018-11-04 DIAGNOSIS — Z981 Arthrodesis status: Secondary | ICD-10-CM | POA: Insufficient documentation

## 2018-11-04 DIAGNOSIS — I25119 Atherosclerotic heart disease of native coronary artery with unspecified angina pectoris: Secondary | ICD-10-CM | POA: Diagnosis not present

## 2018-11-04 DIAGNOSIS — Z79899 Other long term (current) drug therapy: Secondary | ICD-10-CM | POA: Insufficient documentation

## 2018-11-04 DIAGNOSIS — Z7982 Long term (current) use of aspirin: Secondary | ICD-10-CM | POA: Diagnosis not present

## 2018-11-04 DIAGNOSIS — I2584 Coronary atherosclerosis due to calcified coronary lesion: Secondary | ICD-10-CM | POA: Insufficient documentation

## 2018-11-04 DIAGNOSIS — I10 Essential (primary) hypertension: Secondary | ICD-10-CM | POA: Insufficient documentation

## 2018-11-04 DIAGNOSIS — G5602 Carpal tunnel syndrome, left upper limb: Secondary | ICD-10-CM | POA: Insufficient documentation

## 2018-11-04 DIAGNOSIS — R072 Precordial pain: Secondary | ICD-10-CM | POA: Diagnosis not present

## 2018-11-04 DIAGNOSIS — Z8249 Family history of ischemic heart disease and other diseases of the circulatory system: Secondary | ICD-10-CM | POA: Insufficient documentation

## 2018-11-04 HISTORY — PX: LEFT HEART CATH AND CORONARY ANGIOGRAPHY: CATH118249

## 2018-11-04 SURGERY — LEFT HEART CATH AND CORONARY ANGIOGRAPHY
Anesthesia: LOCAL

## 2018-11-04 MED ORDER — ACETAMINOPHEN 325 MG PO TABS: 650.0000 mg | ORAL_TABLET | ORAL | Status: DC | PRN

## 2018-11-04 MED ORDER — VERAPAMIL HCL 2.5 MG/ML IV SOLN
INTRAVENOUS | Status: DC | PRN
  Administered 2018-11-04: 10 mL via INTRA_ARTERIAL

## 2018-11-04 MED ORDER — SODIUM CHLORIDE 0.9 % WEIGHT BASED INFUSION
3.0000 mL/kg/h | INTRAVENOUS | Status: AC
  Administered 2018-11-04: 3 mL/kg/h via INTRAVENOUS

## 2018-11-04 MED ORDER — SODIUM CHLORIDE 0.9 % WEIGHT BASED INFUSION: 1.0000 mL/kg/h | INTRAVENOUS | Status: DC

## 2018-11-04 MED ORDER — FENTANYL CITRATE (PF) 100 MCG/2ML IJ SOLN
INTRAMUSCULAR | Status: AC
  Filled 2018-11-04: qty 2

## 2018-11-04 MED ORDER — HEPARIN SODIUM (PORCINE) 1000 UNIT/ML IJ SOLN
INTRAMUSCULAR | Status: AC
  Filled 2018-11-04: qty 1

## 2018-11-04 MED ORDER — SODIUM CHLORIDE 0.9 % IV SOLN: 250.0000 mL | INTRAVENOUS | Status: DC | PRN

## 2018-11-04 MED ORDER — HEPARIN (PORCINE) IN NACL 1000-0.9 UT/500ML-% IV SOLN
INTRAVENOUS | Status: DC | PRN
  Administered 2018-11-04 (×2): 500 mL

## 2018-11-04 MED ORDER — ONDANSETRON HCL 4 MG/2ML IJ SOLN: 4.0000 mg | Freq: Four times a day (QID) | INTRAMUSCULAR | Status: DC | PRN

## 2018-11-04 MED ORDER — LIDOCAINE HCL (PF) 1 % IJ SOLN
INTRAMUSCULAR | Status: AC
  Filled 2018-11-04: qty 30

## 2018-11-04 MED ORDER — HEPARIN (PORCINE) IN NACL 1000-0.9 UT/500ML-% IV SOLN
INTRAVENOUS | Status: AC
  Filled 2018-11-04: qty 1000

## 2018-11-04 MED ORDER — MIDAZOLAM HCL 2 MG/2ML IJ SOLN
INTRAMUSCULAR | Status: AC
  Filled 2018-11-04: qty 2

## 2018-11-04 MED ORDER — IOHEXOL 350 MG/ML SOLN
INTRAVENOUS | Status: DC | PRN
  Administered 2018-11-04: 50 mL via INTRAVENOUS

## 2018-11-04 MED ORDER — ASPIRIN 81 MG PO CHEW
81.0000 mg | CHEWABLE_TABLET | ORAL | Status: AC
  Administered 2018-11-04: 81 mg via ORAL

## 2018-11-04 MED ORDER — SODIUM CHLORIDE 0.9% FLUSH: 3.0000 mL | INTRAVENOUS | Status: DC | PRN

## 2018-11-04 MED ORDER — VERAPAMIL HCL 2.5 MG/ML IV SOLN
INTRAVENOUS | Status: AC
  Filled 2018-11-04: qty 2

## 2018-11-04 MED ORDER — HEPARIN SODIUM (PORCINE) 1000 UNIT/ML IJ SOLN
INTRAMUSCULAR | Status: DC | PRN
  Administered 2018-11-04: 4000 [IU] via INTRAVENOUS

## 2018-11-04 MED ORDER — FENTANYL CITRATE (PF) 100 MCG/2ML IJ SOLN
INTRAMUSCULAR | Status: DC | PRN
  Administered 2018-11-04 (×2): 25 ug via INTRAVENOUS

## 2018-11-04 MED ORDER — MIDAZOLAM HCL 2 MG/2ML IJ SOLN
INTRAMUSCULAR | Status: DC | PRN
  Administered 2018-11-04 (×2): 1 mg via INTRAVENOUS

## 2018-11-04 MED ORDER — LIDOCAINE HCL (PF) 1 % IJ SOLN
INTRAMUSCULAR | Status: DC | PRN
  Administered 2018-11-04: 5 mL

## 2018-11-04 MED ORDER — SODIUM CHLORIDE 0.9% FLUSH: 3.0000 mL | Freq: Two times a day (BID) | INTRAVENOUS | Status: DC

## 2018-11-04 MED ORDER — ASPIRIN 81 MG PO CHEW
CHEWABLE_TABLET | ORAL | Status: AC
  Administered 2018-11-04: 81 mg via ORAL
  Filled 2018-11-04: qty 1

## 2018-11-04 SURGICAL SUPPLY — 10 items
CATH 5FR JL3.5 JR4 ANG PIG MP (CATHETERS) ×2 IMPLANT
DEVICE RAD COMP TR BAND LRG (VASCULAR PRODUCTS) ×2 IMPLANT
GLIDESHEATH SLEND A-KIT 6F 22G (SHEATH) ×2 IMPLANT
GLIDESHEATH SLEND SS 6F .021 (SHEATH) IMPLANT
GUIDEWIRE INQWIRE 1.5J.035X260 (WIRE) ×1 IMPLANT
INQWIRE 1.5J .035X260CM (WIRE) ×2
KIT HEART LEFT (KITS) ×2 IMPLANT
PACK CARDIAC CATHETERIZATION (CUSTOM PROCEDURE TRAY) ×2 IMPLANT
SYR MEDRAD MARK 7 150ML (SYRINGE) ×2 IMPLANT
TRANSDUCER W/STOPCOCK (MISCELLANEOUS) ×2 IMPLANT

## 2018-11-04 NOTE — Discharge Instructions (Signed)
Drink plenty of fluids over next 48 hours and keep right wrist elevated at heart level for 24 hours ° °Radial Site Care °Refer to this sheet in the next few weeks. These instructions provide you with information about caring for yourself after your procedure. Your health care provider may also give you more specific instructions. Your treatment has been planned according to current medical practices, but problems sometimes occur. Call your health care provider if you have any problems or questions after your procedure. °What can I expect after the procedure? °After your procedure, it is typical to have the following: °· Bruising at the radial site that usually fades within 1-2 weeks. °· Blood collecting in the tissue (hematoma) that may be painful to the touch. It should usually decrease in size and tenderness within 1-2 weeks. ° °Follow these instructions at home: °· Take medicines only as directed by your health care provider. °· You may shower 24-48 hours after the procedure or as directed by your health care provider. Remove the bandage (dressing) and gently wash the site with plain soap and water. Pat the area dry with a clean towel. Do not rub the site, because this may cause bleeding. °· Do not take baths, swim, or use a hot tub until your health care provider approves. °· Check your insertion site every day for redness, swelling, or drainage. °· Do not apply powder or lotion to the site. °· Do not flex or bend the affected arm for 24 hours or as directed by your health care provider. °· Do not push or pull heavy objects with the affected arm for 24 hours or as directed by your health care provider. °· Do not lift over 10 lb (4.5 kg) for 5 days after your procedure or as directed by your health care provider. °· Ask your health care provider when it is okay to: °? Return to work or school. °? Resume usual physical activities or sports. °? Resume sexual activity. °· Do not drive home if you are discharged the  same day as the procedure. Have someone else drive you. °· You may drive 24 hours after the procedure unless otherwise instructed by your health care provider. °· Do not operate machinery or power tools for 24 hours after the procedure. °· If your procedure was done as an outpatient procedure, which means that you went home the same day as your procedure, a responsible adult should be with you for the first 24 hours after you arrive home. °· Keep all follow-up visits as directed by your health care provider. This is important. °Contact a health care provider if: °· You have a fever. °· You have chills. °· You have increased bleeding from the radial site. Hold pressure on the site. °Get help right away if: °· You have unusual pain at the radial site. °· You have redness, warmth, or swelling at the radial site. °· You have drainage (other than a small amount of blood on the dressing) from the radial site. °· The radial site is bleeding, and the bleeding does not stop after 30 minutes of holding steady pressure on the site. °· Your arm or hand becomes pale, cool, tingly, or numb. °This information is not intended to replace advice given to you by your health care provider. Make sure you discuss any questions you have with your health care provider. °Document Released: 12/21/2010 Document Revised: 04/25/2016 Document Reviewed: 06/06/2014 °Elsevier Interactive Patient Education © 2018 [employer] ° °

## 2018-11-04 NOTE — Progress Notes (Signed)
I procotored Dr. Algie CofferKadakia for transradial diagnostic coronary angiogram.  Donald NegusManish J [place] Perra, MD [facility] Pager: [phone] Office: [phone] If no answer Cell [phone]

## 2018-11-04 NOTE — H&P (Signed)
Referring Physician:  ISHMAIL Roberts is an 67 y.o. male.                       Chief Complaint: Recurrent chest pain  HPI: 67 year old male with PMH of CAD, hypertension, hyperlipidemia and low back pain has recurrent exertional chest pain.   Past Medical History:  Diagnosis Date  . Abdominal pain, generalized 07/18/2008  . ALLERGIC RHINITIS 07/18/2008  . Allergy   . Back pain, chronic 10/01/2011  . Carpal tunnel syndrome on left   . Chronic low back pain 12/25/2012  . COLONIC POLYPS, HX OF 07/18/2008  . CORONARY ARTERY DISEASE 07/18/2008  . Degenerative arthritis of right knee 10/01/2011  . DIVERTICULOSIS, COLON 07/18/2008  . Gross hematuria 07/18/2008  . Headache(784.0)    Hx: of Migraines as a child  . Heart murmur   . HYPERLIPIDEMIA 07/18/2008  . Hypertension   . TOBACCO ABUSE 04/06/2009      Past Surgical History:  Procedure Laterality Date  . ANTERIOR CERVICAL DECOMP/DISCECTOMY FUSION N/A 09/10/2013   Procedure: ANTERIOR CERVICAL DECOMPRESSION/DISCECTOMY FUSION CERVICAL FOUR-FIVE,CERVICAL FIVE-SIX WITH PLATING AND BONE GRAFT;  Surgeon: Karn Cassis, MD;  Location: [facility];  Service: Neurosurgery;  Laterality: N/A;  . ANTERIOR CERVICAL DECOMP/DISCECTOMY FUSION N/A 03/14/2015   Procedure: ANTERIOR CERVICAL DECOMPRESSION/DISCECTOMY FUSION  CERVICAL TWO-THREE;  Surgeon: Hilda Lias, MD;  Location: [facility];  Service: Neurosurgery;  Laterality: N/A;  . CARDIAC CATHETERIZATION    . COLONOSCOPY    . COLONOSCOPY W/ BIOPSIES AND POLYPECTOMY     Hx: of  . lumbar surgury     dr botero/ns; 3 level fusion per pt  . POLYPECTOMY    . s/p cervical disease x 2      Family History  Problem Relation Age of Onset  . Heart disease Brother   . Heart disease Mother   . Heart disease Other   . Colon cancer Neg Hx   . Colon polyps Neg Hx   . Esophageal cancer Neg Hx   . Rectal cancer Neg Hx   . Stomach cancer Neg Hx    Social History:  reports that he quit smoking about 22  months ago. His smoking use included e-cigarettes. He has a 40.00 pack-year smoking history. He has never used smokeless tobacco. He reports that he drinks alcohol. He reports that he does not use drugs.  Allergies: No Known Allergies  Medications Prior to Admission  Medication Sig Dispense Refill  . amLODipine (NORVASC) 2.5 MG tablet Take 1 tablet (2.5 mg total) by mouth daily. 30 tablet 1  . aspirin EC 81 MG EC tablet Take 1 tablet (81 mg total) by mouth daily.    Marland Kitchen atorvastatin (LIPITOR) 40 MG tablet Take 1 tablet (40 mg total) by mouth daily at 6 PM. 30 tablet 3  . cyclobenzaprine (FLEXERIL) 10 MG tablet Take 10 mg by mouth 3 (three) times daily as needed for muscle spasms.    . famotidine-calcium carbonate-magnesium hydroxide (PEPCID COMPLETE) 10-800-165 MG CHEW chewable tablet Chew 1 tablet by mouth daily.     . nitroGLYCERIN (NITROSTAT) 0.4 MG SL tablet Place 0.4 mg under the tongue every 5 (five) minutes as needed for chest pain.    . pravastatin (PRAVACHOL) 40 MG tablet Take 40 mg by mouth daily.    . pregabalin (LYRICA) 150 MG capsule TAKE 1 CAPSULE BY MOUTH 2 TIMES DAILY. (Patient taking differently: Take 150 mg by mouth See admin instructions. Takes  150 mg twice daily unless foot neuropathy is bad then take 200 mg twice daily (do not take both 150 and 200 mg together)) 60 capsule 2  . pregabalin (LYRICA) 200 MG capsule Take 200 mg by mouth See admin instructions. Takes 150 mg twice daily unless foot neuropathy is bad then take 200 mg twice daily (do not take both 150 and 200 mg together)    . ramipril (ALTACE) 5 MG capsule Take 5 mg by mouth daily.    . Oxycodone HCl 10 MG TABS Take 1 tablet (10 mg total) by mouth every 6 (six) hours. Max: 4/day. 120 tablet 0    No results found for this or any previous visit (from the past 48 hour(s)). No results found.  Review Of Systems Constitutional: No fever, chills, weight loss or gain. Eyes: No vision change, wears glasses. No discharge or  pain. Ears: No hearing loss, No tinnitus. Respiratory: No asthma, COPD, pneumonias. Positive shortness of breath. No hemoptysis. Cardiovascular: Positive chest pain, palpitation, leg edema. Gastrointestinal: No nausea, vomiting, diarrhea, constipation. No GI bleed. No hepatitis. Genitourinary: No dysuria, hematuria, kidney stone. No incontinance. Neurological: No headache, stroke, seizures.  Psychiatry: No psych facility admission for anxiety, depression, suicide. No detox. Skin: No rash. Musculoskeletal: Positive joint pain, no fibromyalgia. No neck pain, back pain. Lymphadenopathy: No lymphadenopathy. Hematology: No anemia or easy bruising.   Blood pressure 117/80, pulse (!) 57, temperature 97.8 F (36.6 C), temperature source Oral, resp. rate 14, height 5\' 10"  (1.778 m), weight 81.6 kg, SpO2 97 %. Body mass index is 25.83 kg/m. General appearance: alert, cooperative, appears stated age and no distress Head: Normocephalic, atraumatic. Eyes: Hazel eyes, pink conjunctiva, corneas clear. PERRL, EOM's intact. Neck: No adenopathy, no carotid bruit, no JVD, supple, symmetrical, trachea midline and thyroid not enlarged. Resp: Clear to auscultation bilaterally. Cardio: Regular rate and rhythm, S1, S2 normal, II/VI systolic murmur, no click, rub or gallop GI: Soft, non-tender; bowel sounds normal; no organomegaly. Extremities: No edema, cyanosis or clubbing. 2 + peripheral pulses. Skin: Warm and dry.  Neurologic: Alert and oriented X 3, normal strength. Normal coordination and gait.  Assessment/Plan Acute coronary syndrome CAD Hypertension Hyperlipidemia Back pain  Cardiac cath today. Patient understood procedure, risks and alternatives.  Ricki RodriguezAjay S Burns Timson, MD  11/04/2018, 7:30 AM

## 2018-11-05 NOTE — Telephone Encounter (Signed)
Appointment 12/23 pt stated he was going out of country beginning of jan and will be gone till march

## 2018-11-23 ENCOUNTER — Encounter: Payer: Self-pay | Admitting: Family Medicine

## 2018-11-23 ENCOUNTER — Ambulatory Visit (INDEPENDENT_AMBULATORY_CARE_PROVIDER_SITE_OTHER): Payer: BLUE CROSS/BLUE SHIELD | Admitting: Family Medicine

## 2018-11-23 VITALS — BP 100/66 | HR 67 | Temp 97.5°F | Ht 70.0 in | Wt 181.0 lb

## 2018-11-23 DIAGNOSIS — E782 Mixed hyperlipidemia: Secondary | ICD-10-CM

## 2018-11-23 DIAGNOSIS — F5104 Psychophysiologic insomnia: Secondary | ICD-10-CM | POA: Diagnosis not present

## 2018-11-23 DIAGNOSIS — M792 Neuralgia and neuritis, unspecified: Secondary | ICD-10-CM | POA: Diagnosis not present

## 2018-11-23 DIAGNOSIS — I2510 Atherosclerotic heart disease of native coronary artery without angina pectoris: Secondary | ICD-10-CM

## 2018-11-23 MED ORDER — PREGABALIN 150 MG PO CAPS: ORAL_CAPSULE | ORAL | 0 refills | Status: DC

## 2018-11-23 MED ORDER — PREGABALIN 200 MG PO CAPS: ORAL_CAPSULE | ORAL | 0 refills | Status: DC

## 2018-11-23 MED ORDER — TRAZODONE HCL 50 MG PO TABS: 25.0000 mg | ORAL_TABLET | Freq: Every evening | ORAL | 3 refills | Status: DC | PRN

## 2018-11-23 NOTE — Assessment & Plan Note (Signed)
Doing better.. no current symptoms.. has follow up with cards later this week.

## 2018-11-23 NOTE — Assessment & Plan Note (Signed)
Most likely cause of fatigue. Poor sleep at night.  Failed melatonin. Will try trial of trazodone at night.

## 2018-11-23 NOTE — Progress Notes (Signed)
   Subjective:    Patient ID: Donald Roberts, male    DOB: 08/24/1951, 67 y.o.   MRN: [id number]  HPI  67 year old male presents for follow up on hospitalization on  11/27 and again on 12/4.  11/27 Admitted for acute coronary syndrome, CAD  Symptoms were : recurrent chest pain has increased SL NTG use.  Followed by Dr. Algie CofferKadakia cardiology. Significant Diagnostic Studies: labs: Normal CBC, BMET and Troponin I levels. Lipid panel was near normal.  EKG-sinus bradycardia.  Treatments: cardiac meds: ramipril (Altace), aspirin and amlodipine.  12/4 admitted for heart cath  Ost LAD to Prox LAD lesion is 25% stenosed.  No interventions made  EF 55-60% Recommend Aspirin 81mg  daily for moderate CAD   ``    Today he reports  No chest pain since the 4th. No SOB but he is feeling very tired. He wakes up tired.. sleeps 3 hour a night. 15-30 min at a time. Trouble falling asleep. No frequent urination.Marland Kitchen. only one urinating once a night. He has tried  natural med to sleep.Marland Kitchen. likely melatonin. He is using lyrica.Marland Kitchen. does not want to stop this given He has appt later this week with Dr. Algie CofferKadakia. He is on 2 statins and will ask about this at the appointment.   Social History /Family History/Past Medical History reviewed in detail and updated in EMR if needed. Blood pressure 100/66, pulse 67, temperature (!) 97.5 F (36.4 C), temperature source Oral, height 5\' 10"  (1.778 m), weight 181 lb (82.1 kg), SpO2 98 %.  Review of Systems  Constitutional: Negative for fatigue and fever.  HENT: Negative for ear pain.   Eyes: Negative for pain.  Respiratory: Negative for cough and shortness of breath.   Cardiovascular: Negative for chest pain, palpitations and leg swelling.  Gastrointestinal: Negative for abdominal pain.  Genitourinary: Negative for dysuria.  Musculoskeletal: Negative for arthralgias.  Neurological: Negative for syncope, light-headedness and headaches.  Psychiatric/Behavioral: Negative  for dysphoric mood.       Objective:   Physical Exam Constitutional:      Appearance: He is well-developed.  HENT:     Head: Normocephalic.     Right Ear: Hearing normal.     Left Ear: Hearing normal.     Nose: Nose normal.  Neck:     Thyroid: No thyroid mass or thyromegaly.     Vascular: No carotid bruit.     Trachea: Trachea normal.  Cardiovascular:     Rate and Rhythm: Normal rate and regular rhythm.     Pulses: Normal pulses.     Heart sounds: Heart sounds not distant. No murmur. No friction rub. No gallop.      Comments: No peripheral edema Pulmonary:     Effort: Pulmonary effort is normal. No respiratory distress.     Breath sounds: Normal breath sounds.  Skin:    General: Skin is warm and dry.     Findings: No rash.  Psychiatric:        Speech: Speech normal.        Behavior: Behavior normal.        Thought Content: Thought content normal.           Assessment & Plan:

## 2018-11-23 NOTE — Patient Instructions (Addendum)
Start a trial of trazodone for sleep.  If sleep is improved but you are still feeling tired.. call for lab evaluation for fatigue.

## 2018-11-23 NOTE — Assessment & Plan Note (Signed)
On 2 statins... discuss  Stopping pravastatin and increasing atorvastatin with cardiology. Pt hesitant to make a change.

## 2018-11-23 NOTE — Assessment & Plan Note (Signed)
Refilled lyrica.Marland Kitchen. doing well on 200 mg in AM and 150 mg at night.

## 2018-11-26 DIAGNOSIS — R072 Precordial pain: Secondary | ICD-10-CM | POA: Diagnosis not present

## 2018-11-26 DIAGNOSIS — I10 Essential (primary) hypertension: Secondary | ICD-10-CM | POA: Diagnosis not present

## 2018-11-26 DIAGNOSIS — I2510 Atherosclerotic heart disease of native coronary artery without angina pectoris: Secondary | ICD-10-CM | POA: Diagnosis not present

## 2018-12-07 DIAGNOSIS — G8929 Other chronic pain: Secondary | ICD-10-CM | POA: Diagnosis not present

## 2018-12-07 DIAGNOSIS — M545 Low back pain: Secondary | ICD-10-CM | POA: Diagnosis not present

## 2018-12-07 DIAGNOSIS — M47816 Spondylosis without myelopathy or radiculopathy, lumbar region: Secondary | ICD-10-CM | POA: Diagnosis not present

## 2018-12-07 DIAGNOSIS — M5136 Other intervertebral disc degeneration, lumbar region: Secondary | ICD-10-CM | POA: Diagnosis not present

## 2018-12-22 ENCOUNTER — Other Ambulatory Visit: Payer: Self-pay | Admitting: Family Medicine

## 2018-12-22 NOTE — Telephone Encounter (Signed)
Last office visit 11/23/2018 for hospital follow up.  Last refilled 11/23/2018 for #30 with no refills.  Next Appt: 02/23/2019 for follow up insomnia.

## 2019-01-08 ENCOUNTER — Other Ambulatory Visit: Payer: Self-pay | Admitting: Family Medicine

## 2019-01-08 NOTE — Telephone Encounter (Signed)
Last office visit 11/23/2018 for hospital follow up.  Last refilled 11/23/2018 for #30 with no refills.  Next Appt: 02/23/2019 for 3 month follow up.Marland Kitchen

## 2019-02-23 ENCOUNTER — Ambulatory Visit: Payer: BLUE CROSS/BLUE SHIELD | Admitting: Family Medicine

## 2019-03-17 ENCOUNTER — Other Ambulatory Visit: Payer: Self-pay | Admitting: Family Medicine

## 2019-03-17 NOTE — Telephone Encounter (Signed)
Last office visit 11/23/2018 for hospital follow up.  Last refilled 01/08/2019 for #60 with 2 refills.  Next Appt: 04/29/2019 for MWV.

## 2019-04-01 IMAGING — CR DG CERVICAL SPINE WITH FLEX & EXTEND
7 series · 7 of 7 positions shown · non-contrast
Comparison: Lateral cervical spine film of 03/27/2015 and CT
cervical spine of 03/14/2015

CLINICAL DATA: Chronic back pain, neck pain

EXAM:
CERVICAL SPINE COMPLETE WITH FLEXION AND EXTENSION VIEWS

[c-spine lat]
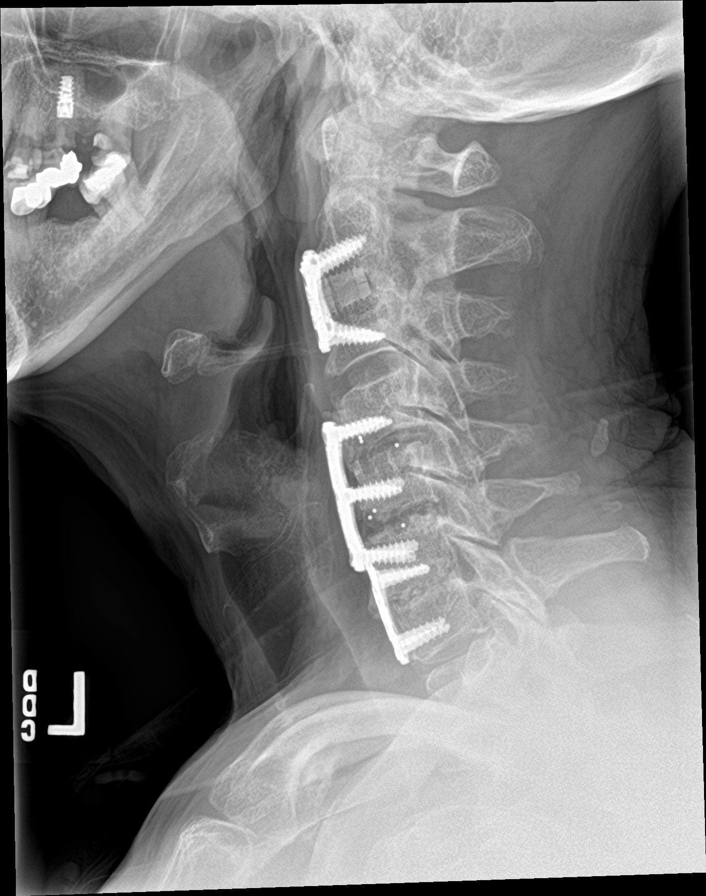

[c-spine flex]
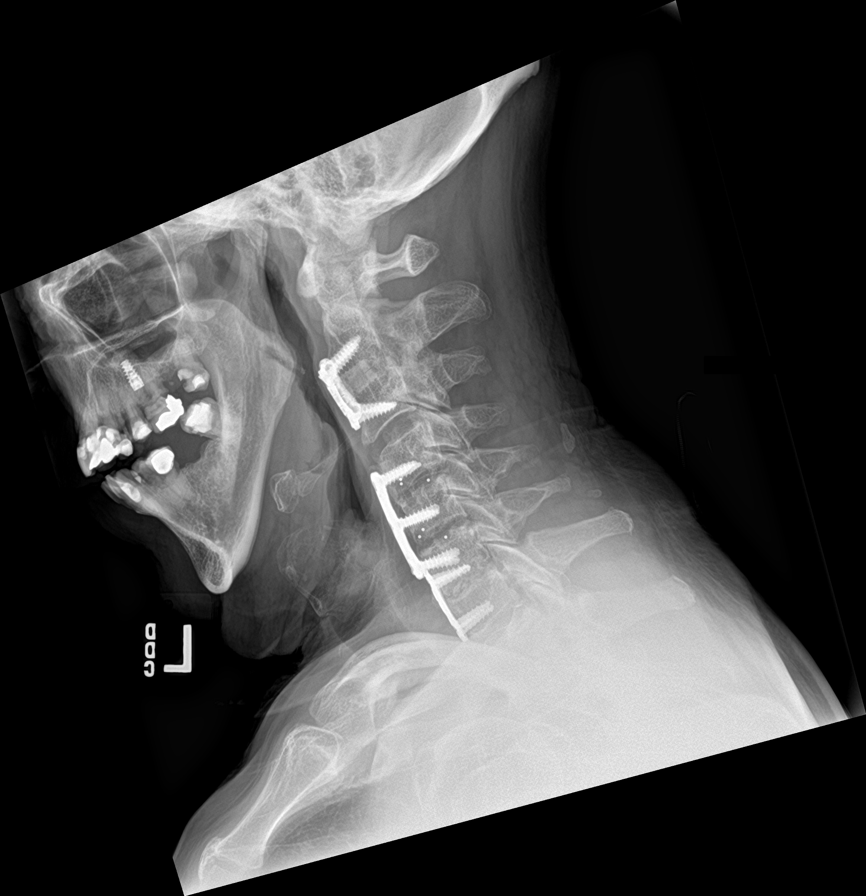

[c-spine ext]
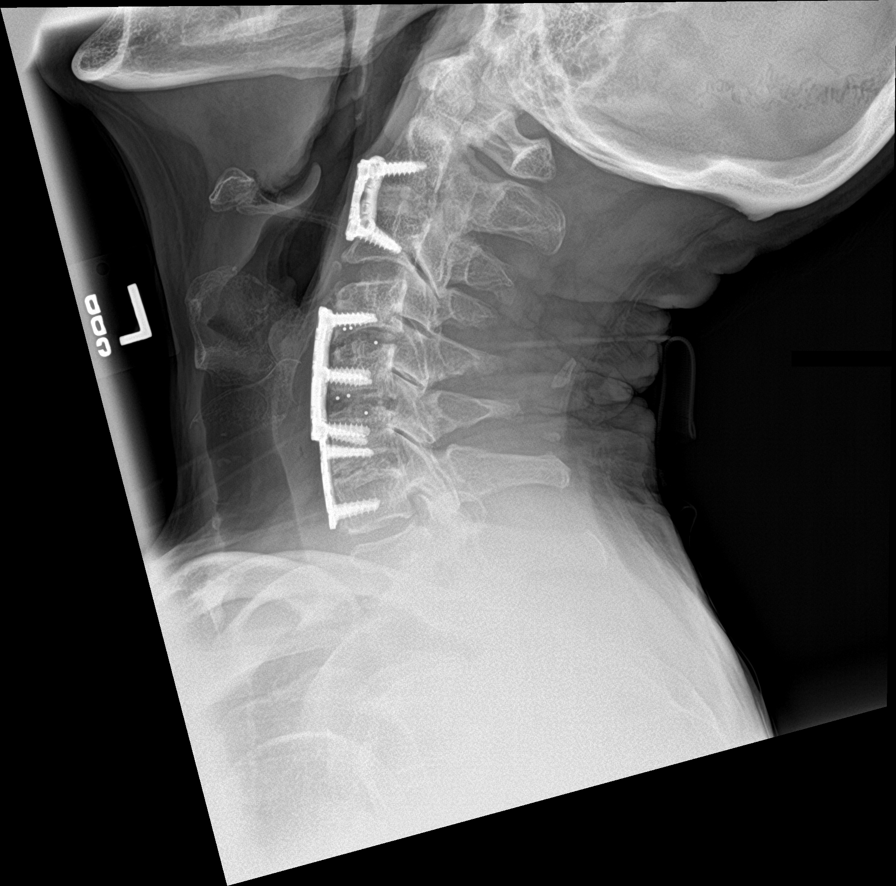

[c-spine obl (1 of 2)]
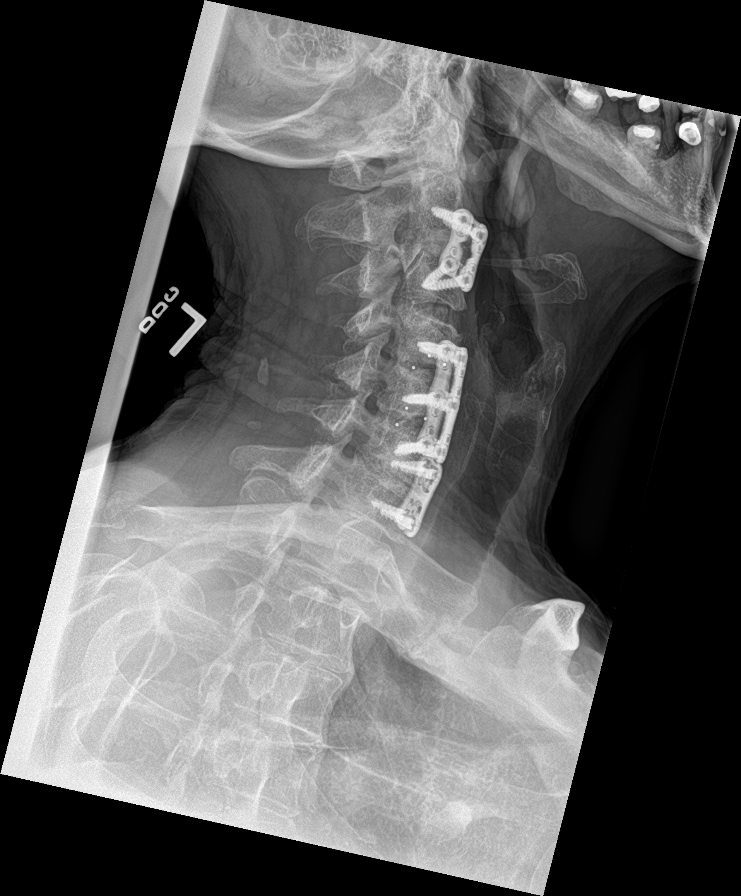

[c-spine obl (2 of 2)]
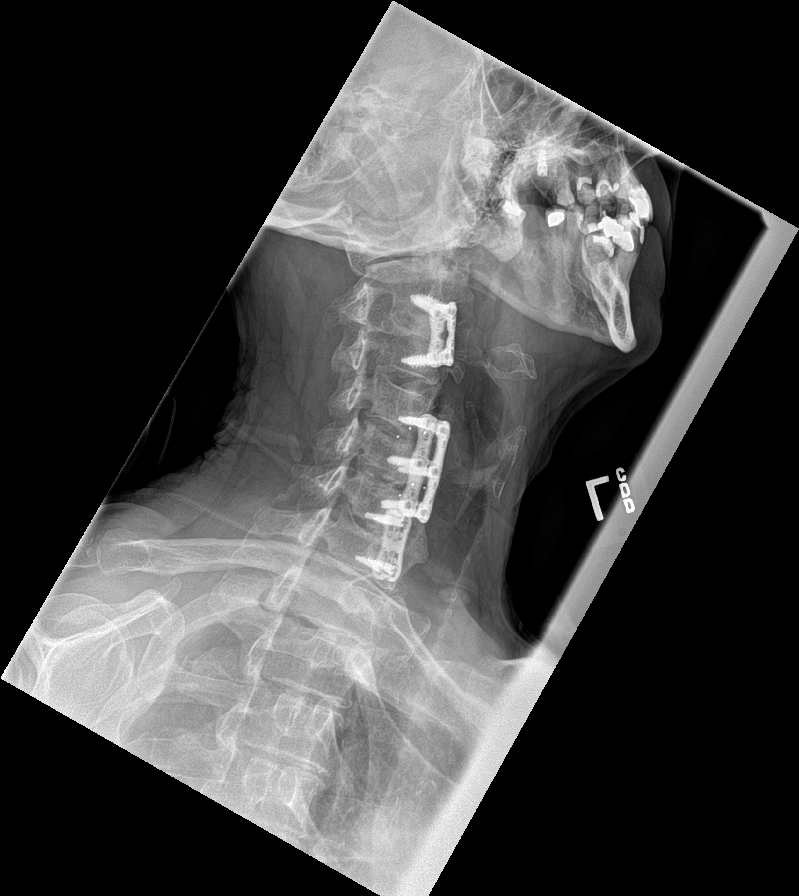

[c-spine ap]
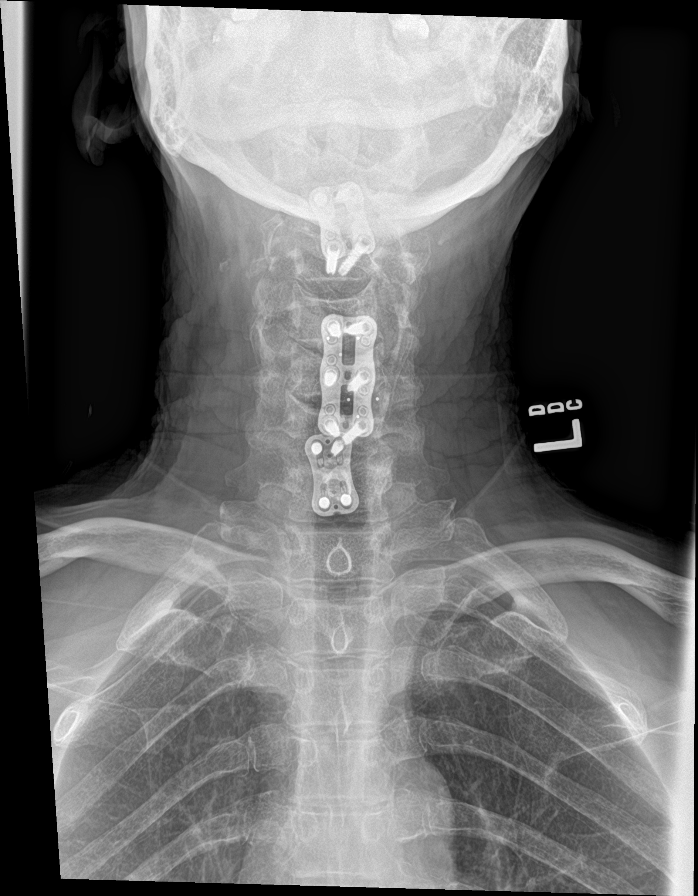

[c-spine open mouth]
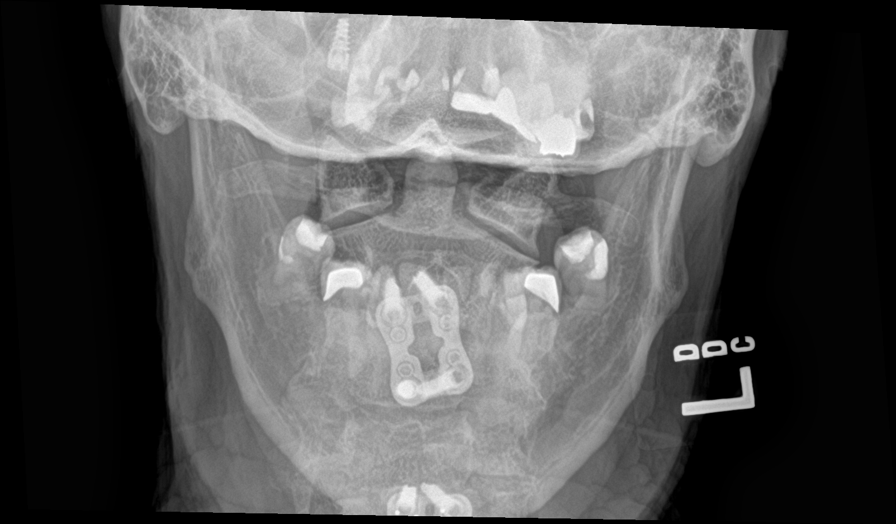

[7 of 7 positions shown; findings below may reference images not displayed]

FINDINGS: Prior fusion at C4-5 6 and C6-7 appears stable with normal
alignment. Anterior fusion is present at C2-3 with interbody fusion
plug in good position. Normal alignment is maintained. No
prevertebral soft tissue swelling is seen. On oblique views there is
some foraminal narrowing at C4-5, C5-6, and C6-7 levels. The
odontoid process is intact. The lung apices are clear.

Through flexion and extension there is slightly limited range of
motion with no malalignment noted.
IMPRESSION: 1. Anterior fusion at C2-3 and C[DATE] as well as C6-7 with normal
alignment maintained. No complicating features.
2. Slightly limited range of motion through flexion and extension.
3. Some foraminal narrowing bilaterally is noted primarily at C4-5,
C5-6 and C6-7 levels.

## 2019-04-05 ENCOUNTER — Telehealth: Payer: Self-pay | Admitting: *Deleted

## 2019-04-05 ENCOUNTER — Telehealth: Payer: Self-pay | Admitting: Family Medicine

## 2019-04-05 MED ORDER — PRAVASTATIN SODIUM 40 MG PO TABS: 40.0000 mg | ORAL_TABLET | Freq: Every day | ORAL | 1 refills | Status: DC

## 2019-04-05 MED ORDER — RAMIPRIL 5 MG PO CAPS: 5.0000 mg | ORAL_CAPSULE | Freq: Every day | ORAL | 1 refills | Status: DC

## 2019-04-05 NOTE — Telephone Encounter (Signed)
Refills sent as requested.  Left message for [name] that refills have been sent to [facility].

## 2019-04-05 NOTE — Telephone Encounter (Signed)
Rx refill sent to pharmacy. Pt was with Dr. Arlyn Leak. He is out of country right now and wife was calling for him. Not sure if they want to come to [place] or . She will talk to pt and get back with [place]. Did let her know he needs to get a cardiologist.   *STAT* If patient is at the pharmacy, call can be transferred to refill team.   1. Which medications need to be refilled? (please list name of each medication and dose if known) Pravastatin 40 mg, qd   2. Which pharmacy/location (including street and city if local pharmacy) is medication to be sent to? [facility] on [address]  3. Do they need a 30 day or 90 day supply? 30 1 refill

## 2019-04-05 NOTE — Telephone Encounter (Signed)
Patient's wife [name] called today in regards to the patient's medications. These medication were prescribed by his heart doctor. Wife stated that the heart doctor he had been seeing is no longer a practicing . Patient's wife would like to see if Dr Ermalene Searing could fill these medications for the patient .  Patient stated that the patient is out of the country and can not return home right now due to the Covid. She wanted to see if this could be sent asap, so she could fed ex these to the patient.    [facility]    PHONE-[phone]

## 2019-04-27 ENCOUNTER — Ambulatory Visit: Payer: BLUE CROSS/BLUE SHIELD

## 2019-04-29 ENCOUNTER — Encounter: Payer: BLUE CROSS/BLUE SHIELD | Admitting: Family Medicine

## 2019-05-04 ENCOUNTER — Ambulatory Visit: Payer: BLUE CROSS/BLUE SHIELD | Admitting: Family Medicine

## 2019-05-13 ENCOUNTER — Other Ambulatory Visit: Payer: Self-pay | Admitting: *Deleted

## 2019-05-13 MED ORDER — PREGABALIN 150 MG PO CAPS: ORAL_CAPSULE | ORAL | 0 refills | Status: DC

## 2019-05-13 MED ORDER — PREGABALIN 200 MG PO CAPS: ORAL_CAPSULE | ORAL | 0 refills | Status: DC

## 2019-05-13 NOTE — Telephone Encounter (Signed)
Last office visit 11/23/2018.  Last refilled Lyrica 150 01/28/2019 for #60 with 2 refills.  Lyrica 200 mg-03/17/2019 for #30 with 2 refills.  Patient is changing to mail order pharmacy.  Next Appt: 06/15/2019 for follow up.

## 2019-05-28 ENCOUNTER — Telehealth: Payer: Self-pay

## 2019-05-28 NOTE — Telephone Encounter (Signed)
Call pt regarding lung screening. Pt is not able to come home. He is located in [place] and can't return until after Aug. 22nd. Please call back after the 22nd of aug. Pt do want to get scan.

## 2019-06-08 ENCOUNTER — Ambulatory Visit: Payer: BLUE CROSS/BLUE SHIELD | Admitting: Family Medicine

## 2019-06-11 ENCOUNTER — Other Ambulatory Visit: Payer: Self-pay | Admitting: Family Medicine

## 2019-06-11 NOTE — Telephone Encounter (Signed)
Last office visit 11/23/2018 for hospital follow up.  Last refilled 05/13/2019 for #90 with no refills.  Next Appt: 06/15/2019 for 3 month follow up.

## 2019-06-15 ENCOUNTER — Encounter: Payer: Self-pay | Admitting: Family Medicine

## 2019-06-15 ENCOUNTER — Ambulatory Visit (INDEPENDENT_AMBULATORY_CARE_PROVIDER_SITE_OTHER): Payer: BC Managed Care – PPO | Admitting: Family Medicine

## 2019-06-15 ENCOUNTER — Other Ambulatory Visit: Payer: Self-pay

## 2019-06-15 VITALS — BP 118/74 | HR 69 | Temp 97.6°F | Ht 70.0 in | Wt 163.0 lb

## 2019-06-15 DIAGNOSIS — F5104 Psychophysiologic insomnia: Secondary | ICD-10-CM | POA: Diagnosis not present

## 2019-06-15 DIAGNOSIS — R5383 Other fatigue: Secondary | ICD-10-CM

## 2019-06-15 LAB — TSH: TSH: 0.82 u[IU]/mL (ref 0.35–4.50)

## 2019-06-15 LAB — CBC WITH DIFFERENTIAL/PLATELET
Basophils Absolute: 0 10*3/uL (ref 0.0–0.1)
Basophils Relative: 0.4 % (ref 0.0–3.0)
Eosinophils Absolute: 0.1 10*3/uL (ref 0.0–0.7)
Eosinophils Relative: 0.8 % (ref 0.0–5.0)
HCT: 44.9 % (ref 39.0–52.0)
Hemoglobin: 15 g/dL (ref 13.0–17.0)
Lymphocytes Relative: 23.3 % (ref 12.0–46.0)
Lymphs Abs: 1.7 10*3/uL (ref 0.7–4.0)
MCHC: 33.4 g/dL (ref 30.0–36.0)
MCV: 87.2 fl (ref 78.0–100.0)
Monocytes Absolute: 0.8 10*3/uL (ref 0.1–1.0)
Monocytes Relative: 11.1 % (ref 3.0–12.0)
Neutro Abs: 4.7 10*3/uL (ref 1.4–7.7)
Neutrophils Relative %: 64.4 % (ref 43.0–77.0)
Platelets: 252 10*3/uL (ref 150.0–400.0)
RBC: 5.15 Mil/uL (ref 4.22–5.81)
RDW: 14.9 % (ref 11.5–15.5)
WBC: 7.2 10*3/uL (ref 4.0–10.5)

## 2019-06-15 LAB — VITAMIN B12: Vitamin B-12: 203 pg/mL — ABNORMAL LOW (ref 211–911)

## 2019-06-15 LAB — T4, FREE: Free T4: 0.75 ng/dL (ref 0.60–1.60)

## 2019-06-15 LAB — T3, FREE: T3, Free: 3.5 pg/mL (ref 2.3–4.2)

## 2019-06-15 NOTE — Patient Instructions (Signed)
Try trial of trazodone at night.  Stop napping during the day.  Please stop at the lab to have labs drawn.

## 2019-06-15 NOTE — Progress Notes (Signed)
Chief Complaint  Patient presents with  . Follow-up    3 mo f/u insomnia/ pt hasn't taken trazadone/ no complaints on sleep    History of Present Illness: HPI  68 year old male presents for 3 month follow up insomnia.   At last OV complaints of insomnia contributing to fatigue in daytime.  Now he states he never tried trazodone ( has been out of the country) and sleep has moderate... 6 hours off and on given back pain. He still naps during the day a lot, does not intend to.  Still feeling tired during the day... bothhersome He has been tired for years... not worsening.  Low energy. No snoring, no apnea at night.   No CP  Lyrica.Marland Kitchen low dose controlling mood.     COVID 19 screen No recent travel or known exposure to COVID19 The patient denies respiratory symptoms of COVID 19 at this time.  The importance of social distancing was discussed today.   Review of Systems  Constitutional: Negative for chills and fever.  HENT: Negative for congestion and ear pain.   Eyes: Negative for pain and redness.  Respiratory: Negative for cough and shortness of breath.   Cardiovascular: Negative for chest pain, palpitations and leg swelling.  Gastrointestinal: Negative for abdominal pain, blood in stool, constipation, diarrhea, nausea and vomiting.  Genitourinary: Negative for dysuria.  Musculoskeletal: Negative for falls and myalgias.  Skin: Negative for rash.  Neurological: Negative for dizziness.  Psychiatric/Behavioral: Negative for depression. The patient is not nervous/anxious.       Past Medical History:  Diagnosis Date  . Abdominal pain, generalized 07/18/2008  . ALLERGIC RHINITIS 07/18/2008  . Allergy   . Back pain, chronic 10/01/2011  . Carpal tunnel syndrome on left   . Chronic low back pain 12/25/2012  . COLONIC POLYPS, HX OF 07/18/2008  . CORONARY ARTERY DISEASE 07/18/2008  . Degenerative arthritis of right knee 10/01/2011  . DIVERTICULOSIS, COLON 07/18/2008  . Gross  hematuria 07/18/2008  . Headache(784.0)    Hx: of Migraines as a child  . Heart murmur   . HYPERLIPIDEMIA 07/18/2008  . Hypertension   . TOBACCO ABUSE 04/06/2009    reports that he quit smoking about 2 years ago. His smoking use included e-cigarettes. He has a 40.00 pack-year smoking history. He has never used smokeless tobacco. He reports current alcohol use. He reports that he does not use drugs.   Current Outpatient Medications:  .  aspirin EC 81 MG EC tablet, Take 1 tablet (81 mg total) by mouth daily., Disp: , Rfl:  .  atorvastatin (LIPITOR) 40 MG tablet, Take 1 tablet (40 mg total) by mouth daily at 6 PM., Disp: 30 tablet, Rfl: 3 .  famotidine-calcium carbonate-magnesium hydroxide (PEPCID COMPLETE) 10-800-165 MG CHEW chewable tablet, Chew 1 tablet by mouth daily. , Disp: , Rfl:  .  nitroGLYCERIN (NITROSTAT) 0.4 MG SL tablet, Place 0.4 mg under the tongue every 5 (five) minutes as needed for chest pain., Disp: , Rfl:  .  pravastatin (PRAVACHOL) 40 MG tablet, Take 1 tablet (40 mg total) by mouth daily. Pt needs office visit before further refills., Disp: 30 tablet, Rfl: 1 .  pregabalin (LYRICA) 150 MG capsule, Take 150 mg daily at night., Disp: 90 capsule, Rfl: 0 .  pregabalin (LYRICA) 200 MG capsule, TAKE 1 CAPSULE BY MOUTH EVERY MORNING. (TAKE 150 MG AT NIGHT), Disp: 30 capsule, Rfl: 2 .  ramipril (ALTACE) 5 MG capsule, Take 1 capsule (5 mg total) by mouth daily.,  Disp: 90 capsule, Rfl: 1 .  traZODone (DESYREL) 50 MG tablet, TAKE 1/2 TO 1 TABLET BY MOUTH AT BEDTIME AS NEEDED FOR SLEEP. (Patient not taking: Reported on 06/15/2019), Disp: 30 tablet, Rfl: 1   Observations/Objective: Blood pressure 118/74, pulse 69, temperature 97.6 F (36.4 C), temperature source Temporal, height 5\' 10"  (1.778 m), weight 163 lb (73.9 kg), SpO2 98 %.  Physical Exam Constitutional:      Appearance: He is well-developed.  HENT:     Head: Normocephalic.     Right Ear: Hearing normal.     Left Ear: Hearing  normal.     Nose: Nose normal.  Neck:     Thyroid: No thyroid mass or thyromegaly.     Vascular: No carotid bruit.     Trachea: Trachea normal.  Cardiovascular:     Rate and Rhythm: Normal rate and regular rhythm.     Pulses: Normal pulses.     Heart sounds: Heart sounds not distant. No murmur. No friction rub. No gallop.      Comments: No peripheral edema Pulmonary:     Effort: Pulmonary effort is normal. No respiratory distress.     Breath sounds: Normal breath sounds.  Skin:    General: Skin is warm and dry.     Findings: No rash.  Psychiatric:        Speech: Speech normal.        Behavior: Behavior normal.        Thought Content: Thought content normal.      Assessment and Plan  Fatigue Likely multifactorial.  Will eval with labs given persistent.. but likely due to napping and insomnia, deconditioning as well as possibel SE to lyrica.   Chronic insomnia Was out of town.. never tried trazodone. Will try now. Reviewed healthy sleep hygeine.       Kerby NoraAmy Nicholi Ghuman, MD

## 2019-06-15 NOTE — Assessment & Plan Note (Signed)
Likely multifactorial.  Will eval with labs given persistent.. but likely due to napping and insomnia, deconditioning as well as possibel SE to lyrica.

## 2019-06-15 NOTE — Assessment & Plan Note (Signed)
Was out of town.. never tried trazodone. Will try now. Reviewed healthy sleep hygeine.

## 2019-06-21 ENCOUNTER — Other Ambulatory Visit: Payer: Self-pay | Admitting: Cardiology

## 2019-06-22 DIAGNOSIS — M47812 Spondylosis without myelopathy or radiculopathy, cervical region: Secondary | ICD-10-CM | POA: Diagnosis not present

## 2019-06-22 DIAGNOSIS — M5136 Other intervertebral disc degeneration, lumbar region: Secondary | ICD-10-CM | POA: Diagnosis not present

## 2019-06-22 DIAGNOSIS — M5416 Radiculopathy, lumbar region: Secondary | ICD-10-CM | POA: Diagnosis not present

## 2019-06-28 ENCOUNTER — Ambulatory Visit: Payer: BC Managed Care – PPO | Admitting: Cardiology

## 2019-07-06 ENCOUNTER — Ambulatory Visit: Payer: BC Managed Care – PPO | Admitting: Cardiology

## 2019-07-06 NOTE — Progress Notes (Deleted)
Cardiology Office Note:    Date:  07/06/2019   ID:  Donald Roberts, DOB 21-Mar-1951, MRN [id number]  PCP:  Jinny Sanders, MD  Cardiologist:  Shirlee More, MD    Referring MD: Jinny Sanders, MD    ASSESSMENT:    No diagnosis found. PLAN:    In order of problems listed above:  1. ***   Next appointment: ***   Medication Adjustments/Labs and Tests Ordered: Current medicines are reviewed at length with the patient today.  Concerns regarding medicines are outlined above.  No orders of the defined types were placed in this encounter.  No orders of the defined types were placed in this encounter.   No chief complaint on file.   History of Present Illness:    Donald Roberts is a 68 y.o. male with a hx of CAD last seen by Dr Wynonia Lawman.  Left heart catheterization performed 11/04/2018 showed mild 25% proximal LAD stenosis with ejection fraction 55 to 60%.. Compliance with diet, lifestyle and medications: *** Past Medical History:  Diagnosis Date   Abdominal pain, generalized 07/18/2008   ALLERGIC RHINITIS 07/18/2008   Allergy    Back pain, chronic 10/01/2011   Carpal tunnel syndrome on left    Chronic low back pain 12/25/2012   COLONIC POLYPS, HX OF 07/18/2008   CORONARY ARTERY DISEASE 07/18/2008   Degenerative arthritis of right knee 10/01/2011   DIVERTICULOSIS, COLON 07/18/2008   Gross hematuria 07/18/2008   Headache(784.0)    Hx: of Migraines as a child   Heart murmur    HYPERLIPIDEMIA 07/18/2008   Hypertension    TOBACCO ABUSE 04/06/2009    Past Surgical History:  Procedure Laterality Date   ANTERIOR CERVICAL DECOMP/DISCECTOMY FUSION N/A 09/10/2013   Procedure: ANTERIOR CERVICAL DECOMPRESSION/DISCECTOMY FUSION CERVICAL FOUR-FIVE,CERVICAL FIVE-SIX WITH PLATING AND BONE GRAFT;  Surgeon: Floyce Stakes, MD;  Location: [facility];  Service: Neurosurgery;  Laterality: N/A;   ANTERIOR CERVICAL DECOMP/DISCECTOMY FUSION N/A 03/14/2015   Procedure: ANTERIOR  CERVICAL DECOMPRESSION/DISCECTOMY FUSION  CERVICAL TWO-THREE;  Surgeon: Leeroy Cha, MD;  Location: [facility];  Service: Neurosurgery;  Laterality: N/A;   CARDIAC CATHETERIZATION     COLONOSCOPY     COLONOSCOPY W/ BIOPSIES AND POLYPECTOMY     Hx: of   LEFT HEART CATH AND CORONARY ANGIOGRAPHY N/A 11/04/2018   Procedure: LEFT HEART CATH AND CORONARY ANGIOGRAPHY;  Surgeon: Dixie Dials, MD;  Location: [facility];  Service: Cardiovascular;  Laterality: N/A;   lumbar surgury     dr botero/ns; 3 level fusion per pt   POLYPECTOMY     s/p cervical disease x 2      Current Medications: No outpatient medications have been marked as taking for the 07/06/19 encounter (Appointment) with Richardo Priest, MD.     Allergies:   Patient has no known allergies.   Social History   Socioeconomic History   Marital status: Married    Spouse name: Not on file   Number of children: Not on file   Years of education: Not on file   Highest education level: Not on file  Occupational History   Occupation: Chief Strategy Officer and [employer] resource strain: Not on file   Food insecurity    Worry: Not on file    Inability: Not on file   Transportation needs    Medical: Not on file    Non-medical: Not on file  Tobacco Use   Smoking status: Former Smoker  Packs/day: 1.00    Years: 40.00    Pack years: 40.00    Types: E-cigarettes    Quit date: 12/23/2016    Years since quitting: 2.5   Smokeless tobacco: Never Used   Tobacco comment: currently uses e cigarettes   Substance and Sexual Activity   Alcohol use: Yes    Comment: occasional   Drug use: No   Sexual activity: Not Currently  Lifestyle   Physical activity    Days per week: Not on file    Minutes per session: Not on file   Stress: Not on file  Relationships   Social connections    Talks on phone: Not on file    Gets together: Not on file    Attends religious service: Not on file      Active member of club or organization: Not on file    Attends meetings of clubs or organizations: Not on file    Relationship status: Not on file  Other Topics Concern   Not on file  Social History Narrative   Married, 1 daughter lives close by.   Sp[place] time at [employer] [place].     Family History: The patient's ***family history includes Heart disease in his brother, mother, and another family member. There is no history of Colon cancer, Colon polyps, Esophageal cancer, Rectal cancer, or Stomach cancer. ROS:   Please see the history of present illness.    All other systems reviewed and are negative.  EKGs/Labs/Other Studies Reviewed:    The following studies were reviewed today:  EKG:  EKG ordered today and personally reviewed.  The ekg ordered today demonstrates ***  Recent Labs: 10/28/2018: ALT 14 10/29/2018: BUN 17; Creatinine, Ser 1.02; Potassium 3.9; Sodium 137 06/15/2019: Hemoglobin 15.0; Platelets 252.0; TSH 0.82  Recent Lipid Panel    Component Value Date/Time   CHOL 129 10/29/2018 0545   TRIG 44 10/29/2018 0545   HDL 43 10/29/2018 0545   CHOLHDL 3.0 10/29/2018 0545   VLDL 9 10/29/2018 0545   LDLCALC 77 10/29/2018 0545    Physical Exam:    VS:  There were no vitals taken for this visit.    Wt Readings from Last 3 Encounters:  06/15/19 163 lb (73.9 kg)  11/23/18 181 lb (82.1 kg)  11/04/18 180 lb (81.6 kg)     GEN: *** Well nourished, well developed in no acute distress HEENT: Normal NECK: No JVD; No carotid bruits LYMPHATICS: No lymphadenopathy CARDIAC: ***RRR, no murmurs, rubs, gallops RESPIRATORY:  Clear to auscultation without rales, wheezing or rhonchi  ABDOMEN: Soft, non-tender, non-distended MUSCULOSKELETAL:  No edema; No deformity  SKIN: Warm and dry NEUROLOGIC:  Alert and oriented x 3 PSYCHIATRIC:  Normal affect    Signed, Norman HerrlichBrian Fiza Nation, MD  07/06/2019 7:58 AM    [facility]

## 2019-07-20 ENCOUNTER — Other Ambulatory Visit: Payer: Self-pay | Admitting: Cardiology

## 2019-07-21 NOTE — Progress Notes (Deleted)
Cardiology Office Note:    Date:  07/21/2019   ID:  Donald Rogersonny O Chalfant, DOB 12/20/1950, MRN [id number]  PCP:  Excell SeltzerBedsole, Amy E, MD  Cardiologist:  Norman HerrlichBrian Munley, MD    Referring MD: Excell SeltzerBedsole, Amy E, MD    ASSESSMENT:    No diagnosis found. PLAN:    In order of problems listed above:  1. ***   Next appointment: ***   Medication Adjustments/Labs and Tests Ordered: Current medicines are reviewed at length with the patient today.  Concerns regarding medicines are outlined above.  No orders of the defined types were placed in this encounter.  No orders of the defined types were placed in this encounter.   No chief complaint on file.   History of Present Illness:    Donald Roberts is a 68 y.o. male with a hx of CAD last seen ***. Compliance with diet, lifestyle and medications: ***  11/04/2018: Coronary Findings  Diagnostic Dominance: Left Left Main  Vessel was injected. Vessel is small. Short length Vessel is angiographically normal. The vessel is mildly calcified.  Left Anterior Descending  Vessel was injected. Vessel is small. There is mild focal disease in the vessel.  Ost LAD to Prox LAD lesion 25% stenosed  Ost LAD to Prox LAD lesion is 25% stenosed. Vessel is not the culprit lesion. The lesion is type A and concentric. The lesion is mildly calcified. The lesion was not previously treated. The stenosis was measured by a visual reading. Pressure wire/FFR was not performed on the lesion. IVUS was not performed.  Ramus Intermedius  Vessel was injected. Vessel is small. Vessel is angiographically normal.  Left Circumflex  Vessel was injected. Vessel is large. The vessel exhibits minimal luminal irregularities.  Right Coronary Artery  Vessel was injected. Vessel is small. Superior take off  Intervention  No interventions have been documented. Wall Motion  Resting       EF 55-60 %          Coronary Diagrams  Diagnostic Dominance: Left  Intervention   Hemo  Data   Most Recent Value  AO Systolic Pressure 111 mmHg  AO Diastolic Pressure 63 mmHg  AO Mean 79 mmHg  LV Systolic Pressure 123 mmHg  LV Diastolic Pressure 3 mmHg  LV EDP 11 mmHg  AOp Systolic Pressure 119 mmHg  AOp Diastolic Pressure 56 mmHg  AOp Mean Pressure 79 mmHg  LVp Systolic Pressure 127 mmHg  LVp Diastolic Pressure 5 mmHg  LVp EDP Pressure 17 mmHg    Past Medical History:  Diagnosis Date  . Abdominal pain, generalized 07/18/2008  . ALLERGIC RHINITIS 07/18/2008  . Allergy   . Back pain, chronic 10/01/2011  . Carpal tunnel syndrome on left   . Chronic low back pain 12/25/2012  . COLONIC POLYPS, HX OF 07/18/2008  . CORONARY ARTERY DISEASE 07/18/2008  . Degenerative arthritis of right knee 10/01/2011  . DIVERTICULOSIS, COLON 07/18/2008  . Gross hematuria 07/18/2008  . Headache(784.0)    Hx: of Migraines as a child  . Heart murmur   . HYPERLIPIDEMIA 07/18/2008  . Hypertension   . TOBACCO ABUSE 04/06/2009    Past Surgical History:  Procedure Laterality Date  . ANTERIOR CERVICAL DECOMP/DISCECTOMY FUSION N/A 09/10/2013   Procedure: ANTERIOR CERVICAL DECOMPRESSION/DISCECTOMY FUSION CERVICAL FOUR-FIVE,CERVICAL FIVE-SIX WITH PLATING AND BONE GRAFT;  Surgeon: Karn CassisErnesto M Botero, MD;  Location: [facility];  Service: Neurosurgery;  Laterality: N/A;  . ANTERIOR CERVICAL DECOMP/DISCECTOMY FUSION N/A 03/14/2015   Procedure: ANTERIOR CERVICAL DECOMPRESSION/DISCECTOMY FUSION  CERVICAL TWO-THREE;  Surgeon: Leeroy Cha, MD;  Location: [facility];  Service: Neurosurgery;  Laterality: N/A;  . CARDIAC CATHETERIZATION    . COLONOSCOPY    . COLONOSCOPY W/ BIOPSIES AND POLYPECTOMY     Hx: of  . LEFT HEART CATH AND CORONARY ANGIOGRAPHY N/A 11/04/2018   Procedure: LEFT HEART CATH AND CORONARY ANGIOGRAPHY;  Surgeon: Dixie Dials, MD;  Location: [facility];  Service: Cardiovascular;  Laterality: N/A;  . lumbar surgury     dr botero/ns; 3 level fusion per pt  . POLYPECTOMY    . s/p  cervical disease x 2      Current Medications: No outpatient medications have been marked as taking for the 07/22/19 encounter (Appointment) with Richardo Priest, MD.     Allergies:   Patient has no known allergies.   Social History   Socioeconomic History  . Marital status: Married    Spouse name: Not on file  . Number of children: Not on file  . Years of education: Not on file  . Highest education level: Not on file  Occupational History  . Occupation: Chief Strategy Officer and Visual merchandiser  Social Needs  . Financial resource strain: Not on file  . Food insecurity    Worry: Not on file    Inability: Not on file  . Transportation needs    Medical: Not on file    Non-medical: Not on file  Tobacco Use  . Smoking status: Former Smoker    Packs/day: 1.00    Years: 40.00    Pack years: 40.00    Types: E-cigarettes    Quit date: 12/23/2016    Years since quitting: 2.5  . Smokeless tobacco: Never Used  . Tobacco comment: currently uses e cigarettes   Substance and Sexual Activity  . Alcohol use: Yes    Comment: occasional  . Drug use: No  . Sexual activity: Not Currently  Lifestyle  . Physical activity    Days per week: Not on file    Minutes per session: Not on file  . Stress: Not on file  Relationships  . Social Herbalist on phone: Not on file    Gets together: Not on file    Attends religious service: Not on file    Active member of club or organization: Not on file    Attends meetings of clubs or organizations: Not on file    Relationship status: Not on file  Other Topics Concern  . Not on file  Social History Narrative   Married, 1 daughter lives close by.   Spends time at [employer] in [place].     Family History: The patient's ***family history includes Heart disease in his brother, mother, and another family member. There is no history of Colon cancer, Colon polyps, Esophageal cancer, Rectal cancer, or Stomach cancer. ROS:   Please see the history of  present illness.    All other systems reviewed and are negative.  EKGs/Labs/Other Studies Reviewed:    The following studies were reviewed today:  EKG:  EKG ordered today and personally reviewed.  The ekg ordered today demonstrates ***  Recent Labs: 10/28/2018: ALT 14 10/29/2018: BUN 17; Creatinine, Ser 1.02; Potassium 3.9; Sodium 137 06/15/2019: Hemoglobin 15.0; Platelets 252.0; TSH 0.82  Recent Lipid Panel    Component Value Date/Time   CHOL 129 10/29/2018 0545   TRIG 44 10/29/2018 0545   HDL 43 10/29/2018 0545   CHOLHDL 3.0 10/29/2018 0545   VLDL  9 10/29/2018 0545   LDLCALC 77 10/29/2018 0545    Physical Exam:    VS:  There were no vitals taken for this visit.    Wt Readings from Last 3 Encounters:  06/15/19 163 lb (73.9 kg)  11/23/18 181 lb (82.1 kg)  11/04/18 180 lb (81.6 kg)     GEN: *** Well nourished, well developed in no acute distress HEENT: Normal NECK: No JVD; No carotid bruits LYMPHATICS: No lymphadenopathy CARDIAC: ***RRR, no murmurs, rubs, gallops RESPIRATORY:  Clear to auscultation without rales, wheezing or rhonchi  ABDOMEN: Soft, non-tender, non-distended MUSCULOSKELETAL:  No edema; No deformity  SKIN: Warm and dry NEUROLOGIC:  Alert and oriented x 3 PSYCHIATRIC:  Normal affect    Signed, Norman HerrlichBrian Munley, MD  07/21/2019 5:44 PM    [facility]

## 2019-07-22 ENCOUNTER — Ambulatory Visit: Payer: BC Managed Care – PPO | Admitting: Cardiology

## 2019-08-04 ENCOUNTER — Telehealth: Payer: Self-pay | Admitting: *Deleted

## 2019-08-04 DIAGNOSIS — Z122 Encounter for screening for malignant neoplasm of respiratory organs: Secondary | ICD-10-CM

## 2019-08-04 DIAGNOSIS — Z87891 Personal history of nicotine dependence: Secondary | ICD-10-CM

## 2019-08-04 NOTE — Telephone Encounter (Signed)
Patient has been notified that lung cancer screening CT scan is due currently or will be in the near future. Confirmed that patient is within appropriate age range and asymptomatic (no signs or symptoms of lung cancer). Patient denies illness that would prevent curative treatment for lung cancer if found. Verified smoking history (former smoker 1 ppd). Patient is agreeable to CT scan being scheduled. He prefers early morning or late afternoon appointment due to work.

## 2019-08-05 ENCOUNTER — Telehealth: Payer: Self-pay | Admitting: *Deleted

## 2019-08-05 NOTE — Telephone Encounter (Signed)
error 

## 2019-08-05 NOTE — Addendum Note (Signed)
Addended by: Lieutenant Diego on: 08/05/2019 04:18 PM   Modules accepted: Orders

## 2019-08-05 NOTE — Telephone Encounter (Signed)
Former, quit 12/23/16, 40 pack year

## 2019-08-11 ENCOUNTER — Other Ambulatory Visit: Payer: Self-pay

## 2019-08-11 ENCOUNTER — Ambulatory Visit
Admission: RE | Admit: 2019-08-11 | Discharge: 2019-08-11 | Disposition: A | Discharge status: Home / Self Care | Payer: BC Managed Care – PPO | Source: Ambulatory Visit | Attending: Oncology | Admitting: Oncology

## 2019-08-11 DIAGNOSIS — Z87891 Personal history of nicotine dependence: Secondary | ICD-10-CM | POA: Diagnosis not present

## 2019-08-11 DIAGNOSIS — Z122 Encounter for screening for malignant neoplasm of respiratory organs: Secondary | ICD-10-CM | POA: Diagnosis not present

## 2019-08-13 ENCOUNTER — Encounter: Payer: Self-pay | Admitting: *Deleted

## 2019-08-13 ENCOUNTER — Encounter: Payer: Self-pay | Admitting: Family Medicine

## 2019-08-13 DIAGNOSIS — I700 Atherosclerosis of aorta: Secondary | ICD-10-CM | POA: Insufficient documentation

## 2019-08-31 ENCOUNTER — Ambulatory Visit: Payer: BLUE CROSS/BLUE SHIELD

## 2019-09-02 ENCOUNTER — Encounter: Payer: BC Managed Care – PPO | Admitting: Family Medicine

## 2019-12-07 DIAGNOSIS — R2689 Other abnormalities of gait and mobility: Secondary | ICD-10-CM | POA: Diagnosis not present

## 2019-12-07 DIAGNOSIS — M21622 Bunionette of left foot: Secondary | ICD-10-CM | POA: Diagnosis not present

## 2019-12-07 DIAGNOSIS — M79672 Pain in left foot: Secondary | ICD-10-CM | POA: Diagnosis not present

## 2019-12-07 DIAGNOSIS — I739 Peripheral vascular disease, unspecified: Secondary | ICD-10-CM | POA: Diagnosis not present

## 2019-12-07 DIAGNOSIS — M21961 Unspecified acquired deformity of right lower leg: Secondary | ICD-10-CM | POA: Diagnosis not present

## 2019-12-07 DIAGNOSIS — M79671 Pain in right foot: Secondary | ICD-10-CM | POA: Diagnosis not present

## 2019-12-23 ENCOUNTER — Other Ambulatory Visit: Payer: Self-pay

## 2019-12-23 ENCOUNTER — Ambulatory Visit (INDEPENDENT_AMBULATORY_CARE_PROVIDER_SITE_OTHER): Payer: BC Managed Care – PPO | Admitting: Cardiology

## 2019-12-23 ENCOUNTER — Encounter: Payer: Self-pay | Admitting: Cardiology

## 2019-12-23 VITALS — BP 92/62 | HR 56 | Ht 70.0 in | Wt 152.0 lb

## 2019-12-23 DIAGNOSIS — I2510 Atherosclerotic heart disease of native coronary artery without angina pectoris: Secondary | ICD-10-CM | POA: Diagnosis not present

## 2019-12-23 DIAGNOSIS — E7800 Pure hypercholesterolemia, unspecified: Secondary | ICD-10-CM

## 2019-12-23 DIAGNOSIS — R072 Precordial pain: Secondary | ICD-10-CM | POA: Diagnosis not present

## 2019-12-23 MED ORDER — NITROGLYCERIN 0.4 MG SL SUBL: 0.4000 mg | SUBLINGUAL_TABLET | SUBLINGUAL | 12 refills | Status: DC | PRN

## 2019-12-23 MED ORDER — PRAVASTATIN SODIUM 40 MG PO TABS: 40.0000 mg | ORAL_TABLET | Freq: Every day | ORAL | 3 refills | Status: DC

## 2019-12-23 MED ORDER — RAMIPRIL 5 MG PO CAPS: 5.0000 mg | ORAL_CAPSULE | Freq: Every day | ORAL | 3 refills | Status: DC

## 2019-12-23 NOTE — Progress Notes (Signed)
Referring-Amy Ermalene Searing, MD Reason for referral-coronary artery disease  HPI: 69 year old male for evaluation of coronary artery disease at request of Kerby Nora, MD.  Previously followed by Dr. Algie Coffer. Cardiac catheterization December 2019 showed 25% proximal LAD.  Chest CT September 2020 showed emphysema, aortic atherosclerosis and coronary calcification.  Patient has dyspnea on exertion but denies orthopnea, PND, pedal edema, claudication or syncope.  Approximately 2 weeks ago he had pain in his substernal area described as his "grand baby" sitting on his chest.  There was some pain in his left upper extremity as well.  No associated symptoms.  Not pleuritic.  Symptoms resolved with nitroglycerin x1 after 15 minutes.  Note he is very active and denies exertional chest pain.  He is not he is on pravastatin type at Lipitor office list  Current Outpatient Medications  Medication Sig Dispense Refill  . aspirin EC 81 MG EC tablet Take 1 tablet (81 mg total) by mouth daily.    Marland Kitchen atorvastatin (LIPITOR) 40 MG tablet Take 1 tablet (40 mg total) by mouth daily at 6 PM. 30 tablet 3  . famotidine-calcium carbonate-magnesium hydroxide (PEPCID COMPLETE) 10-800-165 MG CHEW chewable tablet Chew 1 tablet by mouth daily.     . nitroGLYCERIN (NITROSTAT) 0.4 MG SL tablet Place 0.4 mg under the tongue every 5 (five) minutes as needed for chest pain.    . pravastatin (PRAVACHOL) 40 MG tablet Take 1 tablet (40 mg total) by mouth daily. 90 tablet 1  . pregabalin (LYRICA) 150 MG capsule Take 150 mg daily at night. 90 capsule 0  . pregabalin (LYRICA) 200 MG capsule TAKE 1 CAPSULE BY MOUTH EVERY MORNING. (TAKE 150 MG AT NIGHT) 30 capsule 2  . ramipril (ALTACE) 5 MG capsule Take 1 capsule (5 mg total) by mouth daily. 90 capsule 1   No current facility-administered medications for this visit.    No Known Allergies   Past Medical History:  Diagnosis Date  . Abdominal pain, generalized 07/18/2008  . ALLERGIC  RHINITIS 07/18/2008  . Allergy   . Back pain, chronic 10/01/2011  . Carpal tunnel syndrome on left   . Chronic low back pain 12/25/2012  . COLONIC POLYPS, HX OF 07/18/2008  . CORONARY ARTERY DISEASE 07/18/2008  . Degenerative arthritis of right knee 10/01/2011  . DIVERTICULOSIS, COLON 07/18/2008  . Gross hematuria 07/18/2008  . Headache(784.0)    Hx: of Migraines as a child  . Heart murmur   . HYPERLIPIDEMIA 07/18/2008  . Hypertension   . TOBACCO ABUSE 04/06/2009    Past Surgical History:  Procedure Laterality Date  . ANTERIOR CERVICAL DECOMP/DISCECTOMY FUSION N/A 09/10/2013   Procedure: ANTERIOR CERVICAL DECOMPRESSION/DISCECTOMY FUSION CERVICAL FOUR-FIVE,CERVICAL FIVE-SIX WITH PLATING AND BONE GRAFT;  Surgeon: Karn Cassis, MD;  Location: [facility];  Service: Neurosurgery;  Laterality: N/A;  . ANTERIOR CERVICAL DECOMP/DISCECTOMY FUSION N/A 03/14/2015   Procedure: ANTERIOR CERVICAL DECOMPRESSION/DISCECTOMY FUSION  CERVICAL TWO-THREE;  Surgeon: Hilda Lias, MD;  Location: [facility];  Service: Neurosurgery;  Laterality: N/A;  . CARDIAC CATHETERIZATION    . COLONOSCOPY    . COLONOSCOPY W/ BIOPSIES AND POLYPECTOMY     Hx: of  . LEFT HEART CATH AND CORONARY ANGIOGRAPHY N/A 11/04/2018   Procedure: LEFT HEART CATH AND CORONARY ANGIOGRAPHY;  Surgeon: Orpah Cobb, MD;  Location: [facility];  Service: Cardiovascular;  Laterality: N/A;  . lumbar surgury     dr botero/ns; 3 level fusion per pt  . POLYPECTOMY    . s/p  cervical disease x 2      Social History   Socioeconomic History  . Marital status: Married    Spouse name: Not on file  . Number of children: Not on file  . Years of education: Not on file  . Highest education level: Not on file  Occupational History  . Occupation: Surveyor, minerals and Midwife  Tobacco Use  . Smoking status: Former Smoker    Packs/day: 1.00    Years: 40.00    Pack years: 40.00    Types: E-cigarettes    Quit date: 12/23/2016    Years  since quitting: 3.0  . Smokeless tobacco: Never Used  . Tobacco comment: currently uses e cigarettes   Substance and Sexual Activity  . Alcohol use: Yes    Comment: occasional  . Drug use: No  . Sexual activity: Not Currently  Other Topics Concern  . Not on file  Social History Narrative   Married, 1 daughter lives close by.   Spends time at [employer] in [place].   Social Determinants of Health   Financial Resource Strain:   . Difficulty of Paying Living Expenses: Not on file  Food Insecurity:   . Worried About Programme researcher, broadcasting/film/video in the Last Year: Not on file  . Ran Out of Food in the Last Year: Not on file  Transportation Needs:   . Lack of Transportation (Medical): Not on file  . Lack of Transportation (Non-Medical): Not on file  Physical Activity:   . Days of Exercise per Week: Not on file  . Minutes of Exercise per Session: Not on file  Stress:   . Feeling of Stress : Not on file  Social Connections:   . Frequency of Communication with Friends and Family: Not on file  . Frequency of Social Gatherings with Friends and Family: Not on file  . Attends Religious Services: Not on file  . Active Member of Clubs or Organizations: Not on file  . Attends Banker Meetings: Not on file  . Marital Status: Not on file  Intimate Partner Violence:   . Fear of Current or Ex-Partner: Not on file  . Emotionally Abused: Not on file  . Physically Abused: Not on file  . Sexually Abused: Not on file    Family History  Problem Relation Age of Onset  . Heart disease Brother   . Heart disease Mother   . Heart disease Other   . Colon cancer Neg Hx   . Colon polyps Neg Hx   . Esophageal cancer Neg Hx   . Rectal cancer Neg Hx   . Stomach cancer Neg Hx     ROS: no fevers or chills, productive cough, hemoptysis, dysphasia, odynophagia, melena, hematochezia, dysuria, hematuria, rash, seizure activity, orthopnea, PND, pedal edema, claudication. Remaining systems are  negative.  Physical Exam:   Blood pressure 92/62, pulse (!) 56, height 5\' 10"  (1.778 m), weight 152 lb (68.9 kg).  General:  Well developed/well nourished in NAD Skin warm/dry Patient not depressed No peripheral clubbing Back-normal HEENT-normal/normal eyelids Neck supple/normal carotid upstroke bilaterally; no bruits; no JVD; no thyromegaly chest -diminished breath sounds. CV - RRR/normal S1 and S2; no murmurs, rubs or gallops;  PMI nondisplaced Abdomen -NT/ND, no HSM, no mass, + bowel sounds, positive bruit 2+ femoral pulses, no bruits Ext-no edema, chords, 1+ DP Neuro-grossly nonfocal  ECG -sinus bradycardia at a rate of 56, cannot rule out septal infarct.  Personally reviewed  A/P  1 coronary artery disease-plan  to continue aspirin and statin.  2 hypertension-blood pressure controlled.  Continue present medications and follow.  3 hyperlipidemia-continue pravastatin.  Check lipids and liver.  4 bruit-schedule abdominal ultrasound to exclude aneurysm.  5 tobacco abuse-patient counseled on discontinuing.  6 chest pain-symptoms somewhat atypical.  Electrocardiogram shows no ST changes.  Cardiac catheterization approximately 1 year ago showed nonobstructive coronary disease.  We will continue medical therapy at this point.  If symptoms recur will consider further ischemia evaluation.  Kirk Ruths, MD

## 2019-12-23 NOTE — Patient Instructions (Signed)
Medication Instructions:  NO CHANGES *If you need a refill on your cardiac medications before your next appointment, please call your pharmacy*  Lab Work: FASTING LIPID AND LIVER WHEN YOU COME IN FOR YOUR ABDOMINAL ULTRASOUND If you have labs (blood work) drawn today and your tests are completely normal, you will receive your results only by: Marland Kitchen MyChart Message (if you have MyChart) OR . A paper copy in the mail If you have any lab test that is abnormal or we need to change your treatment, we will call you to review the results.  Testing/Procedures: Your physician has requested that you have an abdominal aorta duplex. During this test, an ultrasound is used to evaluate the aorta. Allow 30 minutes for this exam. Do not eat after midnight the day before and avoid carbonated beverages DONE HERE IN OFFICE  Follow-Up: At [facility], you and your health needs are our priority.  As part of our continuing mission to provide you with exceptional heart care, we have created designated Provider Care Teams.  These Care Teams include your primary Cardiologist (physician) and Advanced Practice Providers (APPs -  Physician Assistants and Nurse Practitioners) who all work together to provide you with the care you need, when you need it.  Your next appointment:   6 month(s)  The format for your next appointment:   In Person  Provider:   Olga Millers, MD

## 2019-12-24 ENCOUNTER — Other Ambulatory Visit: Payer: Self-pay | Admitting: Cardiology

## 2019-12-24 DIAGNOSIS — R0989 Other specified symptoms and signs involving the circulatory and respiratory systems: Secondary | ICD-10-CM

## 2019-12-28 ENCOUNTER — Ambulatory Visit (HOSPITAL_COMMUNITY): Payer: BC Managed Care – PPO

## 2020-01-04 ENCOUNTER — Other Ambulatory Visit: Payer: Self-pay

## 2020-01-04 ENCOUNTER — Ambulatory Visit (HOSPITAL_COMMUNITY)
Admission: RE | Admit: 2020-01-04 | Discharge: 2020-01-04 | Disposition: A | Discharge status: Home / Self Care | Payer: BC Managed Care – PPO | Source: Ambulatory Visit | Attending: Internal Medicine | Admitting: Internal Medicine

## 2020-01-04 DIAGNOSIS — R0989 Other specified symptoms and signs involving the circulatory and respiratory systems: Secondary | ICD-10-CM | POA: Diagnosis not present

## 2020-01-06 LAB — LIPID PANEL
Chol/HDL Ratio: 2.6 ratio (ref 0.0–5.0)
Cholesterol, Total: 136 mg/dL (ref 100–199)
HDL: 52 mg/dL (ref >39)
LDL Chol Calc (NIH): 72 mg/dL (ref 0–99)
Triglycerides: 58 mg/dL (ref 0–149)
VLDL Cholesterol Cal: 12 mg/dL (ref 5–40)

## 2020-01-06 LAB — HEPATIC FUNCTION PANEL
ALT: 10 IU/L (ref 0–44)
AST: 17 IU/L (ref 0–40)
Albumin: 4 g/dL (ref 3.8–4.8)
Alkaline Phosphatase: 62 IU/L (ref 39–117)
Bilirubin Total: 0.3 mg/dL (ref 0.0–1.2)
Bilirubin, Direct: 0.11 mg/dL (ref 0.00–0.40)
Total Protein: 6.9 g/dL (ref 6.0–8.5)

## 2020-01-07 ENCOUNTER — Encounter: Payer: Self-pay | Admitting: *Deleted

## 2020-01-19 ENCOUNTER — Encounter: Payer: Self-pay | Admitting: *Deleted

## 2020-01-21 ENCOUNTER — Ambulatory Visit: Payer: BC Managed Care – PPO | Admitting: Diagnostic Neuroimaging

## 2020-01-24 ENCOUNTER — Encounter: Payer: Self-pay | Admitting: Diagnostic Neuroimaging

## 2020-01-24 ENCOUNTER — Other Ambulatory Visit: Payer: Self-pay

## 2020-01-24 ENCOUNTER — Ambulatory Visit (INDEPENDENT_AMBULATORY_CARE_PROVIDER_SITE_OTHER): Payer: BC Managed Care – PPO | Admitting: Diagnostic Neuroimaging

## 2020-01-24 VITALS — BP 144/75 | HR 57 | Temp 97.2°F | Ht 70.0 in | Wt 158.6 lb

## 2020-01-24 DIAGNOSIS — G629 Polyneuropathy, unspecified: Secondary | ICD-10-CM

## 2020-01-24 DIAGNOSIS — I2510 Atherosclerotic heart disease of native coronary artery without angina pectoris: Secondary | ICD-10-CM

## 2020-01-24 MED ORDER — DULOXETINE HCL 30 MG PO CPEP: 30.0000 mg | ORAL_CAPSULE | Freq: Every day | ORAL | 6 refills | Status: DC

## 2020-01-24 NOTE — Patient Instructions (Signed)
  BURNING FEET (neuropathy + lumbar radiculopathies)  - check neuropathy labs  - continue lyrica 150mg  daily + 200mg  at night  - start duloxetine 30mg  daily

## 2020-01-24 NOTE — Progress Notes (Signed)
[facility]  PATIENT: FLORA RATZ DOB: 1951-03-15  REFERRING CLINICIAN: Jinny Sanders, MD HISTORY FROM: patient  REASON FOR VISIT: new consult    HISTORICAL  CHIEF COMPLAINT:  Chief Complaint  Patient presents with  . Idiopathic progressive neuropathy    rm 7 New Pt    HISTORY OF PRESENT ILLNESS:   69 year old male here for evaluation of burning feet.  Reports 3 to 4 years of numbness, pain and burning sensation in his bilateral feet.  Symptoms have been stable in the last year.  Also history of 5 low back surgeries as well as neck surgery.  Has had low B12 levels in the past.  Patient has tried gabapentin and Lyrica with mild relief.  Was under pain management in the past but no longer going there.    REVIEW OF SYSTEMS: Full 14 system review of systems performed and negative with exception of: As per HPI.  ALLERGIES: No Known Allergies  HOME MEDICATIONS: Outpatient Medications Prior to Visit  Medication Sig Dispense Refill  . aspirin EC 81 MG EC tablet Take 1 tablet (81 mg total) by mouth daily.    . famotidine-calcium carbonate-magnesium hydroxide (PEPCID COMPLETE) 10-800-165 MG CHEW chewable tablet Chew 1 tablet by mouth daily.     . pravastatin (PRAVACHOL) 40 MG tablet Take 1 tablet (40 mg total) by mouth daily. 90 tablet 3  . pregabalin (LYRICA) 150 MG capsule Take 150 mg daily at night. 90 capsule 0  . pregabalin (LYRICA) 200 MG capsule Take 200 mg by mouth daily. In morning    . ramipril (ALTACE) 5 MG capsule Take 1 capsule (5 mg total) by mouth daily. 90 capsule 3  . nitroGLYCERIN (NITROSTAT) 0.4 MG SL tablet Place 1 tablet (0.4 mg total) under the tongue every 5 (five) minutes as needed for chest pain. (Patient not taking: Reported on 01/24/2020) 25 tablet 12  . pregabalin (LYRICA) 200 MG capsule TAKE 1 CAPSULE BY MOUTH EVERY MORNING. (TAKE 150 MG AT NIGHT) 30 capsule 2   No facility-administered medications prior to visit.    PAST  MEDICAL HISTORY: Past Medical History:  Diagnosis Date  . Abdominal pain, generalized 07/18/2008  . Abnormal gait   . ALLERGIC RHINITIS 07/18/2008  . Allergy   . Back pain, chronic 10/01/2011  . Carpal tunnel syndrome on left   . Chronic low back pain 12/25/2012  . COLONIC POLYPS, HX OF 07/18/2008  . CORONARY ARTERY DISEASE 07/18/2008  . Degenerative arthritis of right knee 10/01/2011  . DIVERTICULOSIS, COLON 07/18/2008  . Gross hematuria 07/18/2008  . Headache(784.0)    Hx: of Migraines as a child  . Heart murmur   . HYPERLIPIDEMIA 07/18/2008  . Hypertension   . Idiopathic progressive neuropathy   . PVD (peripheral vascular disease) ([place])   . TOBACCO ABUSE 04/06/2009    PAST SURGICAL HISTORY: Past Surgical History:  Procedure Laterality Date  . ANTERIOR CERVICAL DECOMP/DISCECTOMY FUSION N/A 09/10/2013   Procedure: ANTERIOR CERVICAL DECOMPRESSION/DISCECTOMY FUSION CERVICAL FOUR-FIVE,CERVICAL FIVE-SIX WITH PLATING AND BONE GRAFT;  Surgeon: Floyce Stakes, MD;  Location: [place] [facility];  Service: Neurosurgery;  Laterality: N/A;  . ANTERIOR CERVICAL DECOMP/DISCECTOMY FUSION N/A 03/14/2015   Procedure: ANTERIOR CERVICAL DECOMPRESSION/DISCECTOMY FUSION  CERVICAL TWO-THREE;  Surgeon: Leeroy Cha, MD;  Location: [place] [facility];  Service: Neurosurgery;  Laterality: N/A;  . CARDIAC CATHETERIZATION    . COLONOSCOPY    . COLONOSCOPY W/ BIOPSIES AND POLYPECTOMY     Hx: of  . LEFT HEART CATH  AND CORONARY ANGIOGRAPHY N/A 11/04/2018   Procedure: LEFT HEART CATH AND CORONARY ANGIOGRAPHY;  Surgeon: Dixie Dials, MD;  Location: [facility];  Service: Cardiovascular;  Laterality: N/A;  . lumbar surgury     dr botero/ns; 3 level fusion per pt  . POLYPECTOMY    . s/p cervical disease x 2      FAMILY HISTORY: Family History  Problem Relation Age of Onset  . Heart attack Father   . Heart disease Brother   . Heart disease Mother   . Heart disease Other   . Parkinsonism Paternal Uncle   .  Colon cancer Neg Hx   . Colon polyps Neg Hx   . Esophageal cancer Neg Hx   . Rectal cancer Neg Hx   . Stomach cancer Neg Hx     SOCIAL HISTORY: Social History   Socioeconomic History  . Marital status: Married    Spouse name: Hassan Rowan  . Number of children: 1  . Years of education: Not on file  . Highest education level: High school graduate  Occupational History  . Occupation: Chief Strategy Officer and Visual merchandiser    Comment: semi retired  Tobacco Use  . Smoking status: Former Smoker    Packs/day: 1.00    Years: 40.00    Pack years: 40.00    Types: E-cigarettes    Quit date: 12/23/2016    Years since quitting: 3.0  . Smokeless tobacco: Never Used  . Tobacco comment: currently uses e cigarettes   Substance and Sexual Activity  . Alcohol use: Yes    Comment: occasional  . Drug use: No  . Sexual activity: Not Currently  Other Topics Concern  . Not on file  Social History Narrative   Married, 1 daughter lives close by.   Spends time at [employer] in [place].   Social Determinants of Health   Financial Resource Strain:   . Difficulty of Paying Living Expenses: Not on file  Food Insecurity:   . Worried About Charity fundraiser in the Last Year: Not on file  . Ran Out of Food in the Last Year: Not on file  Transportation Needs:   . Lack of Transportation (Medical): Not on file  . Lack of Transportation (Non-Medical): Not on file  Physical Activity:   . Days of Exercise per Week: Not on file  . Minutes of Exercise per Session: Not on file  Stress:   . Feeling of Stress : Not on file  Social Connections:   . Frequency of Communication with Friends and Family: Not on file  . Frequency of Social Gatherings with Friends and Family: Not on file  . Attends Religious Services: Not on file  . Active Member of Clubs or Organizations: Not on file  . Attends Archivist Meetings: Not on file  . Marital Status: Not on file  Intimate Partner Violence:   . Fear of Current or  Ex-Partner: Not on file  . Emotionally Abused: Not on file  . Physically Abused: Not on file  . Sexually Abused: Not on file     PHYSICAL EXAM  GENERAL EXAM/CONSTITUTIONAL: Vitals:  Vitals:   01/24/20 1244  BP: (!) 144/75  Pulse: (!) 57  Temp: (!) 97.2 F (36.2 C)  Weight: 158 lb 9.6 oz (71.9 kg)  Height: 5' 10" (1.778 m)     Body mass index is 22.76 kg/m. Wt Readings from Last 3 Encounters:  01/24/20 158 lb 9.6 oz (71.9 kg)  12/23/19 152 lb (  68.9 kg)  08/11/19 149 lb (67.6 kg)     Patient is in no distress; well developed, nourished and groomed; neck is supple  CARDIOVASCULAR:  Examination of carotid arteries is normal; no carotid bruits  Regular rate and rhythm, no murmurs  Examination of peripheral vascular system by observation and palpation is normal  EYES:  Ophthalmoscopic exam of optic discs and posterior segments is normal; no papilledema or hemorrhages  No exam data present  MUSCULOSKELETAL:  Gait, strength, tone, movements noted in Neurologic exam below  NEUROLOGIC: MENTAL STATUS:  MMSE - [place] Exam 04/10/2018 12/30/2016  Orientation to time 5 5  Orientation to Place 5 5  Registration 3 3  Attention/ Calculation 0 0  Recall 3 3  Language- name 2 objects 0 0  Language- repeat 1 1  Language- follow 3 step command 3 3  Language- read & follow direction 0 0  Write a sentence 0 0  Copy design 0 0  Total score 20 20    awake, alert, oriented to person, place and time  recent and remote memory intact  normal attention and concentration  language fluent, comprehension intact, naming intact  fund of knowledge appropriate  CRANIAL NERVE:   2nd - no papilledema on fundoscopic exam  2nd, 3rd, 4th, 6th - pupils equal and reactive to light, visual fields full to confrontation, extraocular muscles intact, no nystagmus  5th - facial sensation symmetric  7th - facial strength symmetric  8th - hearing intact  9th - palate  elevates symmetrically, uvula midline  11th - shoulder shrug symmetric  12th - tongue protrusion midline  MOTOR:   normal bulk and tone, full strength in the BUE, BLE  SENSORY:   normal and symmetric to light touch, temperature, vibration  COORDINATION:   finger-nose-finger, fine finger movements normal  REFLEXES:   deep tendon reflexes TRACE and symmetric  GAIT/STATION:   narrow based gait     DIAGNOSTIC DATA (LABS, IMAGING, TESTING) - I reviewed patient records, labs, notes, testing and imaging myself where available.  Lab Results  Component Value Date   WBC 7.2 06/15/2019   HGB 15.0 06/15/2019   HCT 44.9 06/15/2019   MCV 87.2 06/15/2019   PLT 252.0 06/15/2019      Component Value Date/Time   NA 137 10/29/2018 0545   NA 139 04/01/2018 1125   K 3.9 10/29/2018 0545   CL 106 10/29/2018 0545   CO2 22 10/29/2018 0545   GLUCOSE 93 10/29/2018 0545   BUN 17 10/29/2018 0545   BUN 6 (L) 04/01/2018 1125   CREATININE 1.02 10/29/2018 0545   CALCIUM 8.8 (L) 10/29/2018 0545   PROT 6.9 01/06/2020 0946   ALBUMIN 4.0 01/06/2020 0946   AST 17 01/06/2020 0946   ALT 10 01/06/2020 0946   ALKPHOS 62 01/06/2020 0946   BILITOT 0.3 01/06/2020 0946   GFRNONAA >60 10/29/2018 0545   GFRAA >60 10/29/2018 0545   Lab Results  Component Value Date   CHOL 136 01/06/2020   HDL 52 01/06/2020   LDLCALC 72 01/06/2020   TRIG 58 01/06/2020   CHOLHDL 2.6 01/06/2020   No results found for: HGBA1C Lab Results  Component Value Date   VITAMINB12 203 (L) 06/15/2019   Lab Results  Component Value Date   TSH 0.82 06/15/2019     08/18/18 MRI lumbar  - Satisfactory appearance in the fusion segment from L2 through L5. Central canal widely patent. Foramina widely patent at L2-3 and L3-4. Mild chronic foraminal  narrowing at L4-5. - Minor degenerative changes at L1-2 and L5-S1 but without evidence of stenosis, neural compression or edematous facet arthropathy. 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   ASSESSMENT AND PLAN  69 y.o. year old male here with numbness and burning in bilateral feet, with signs symptoms consistent with peripheral neuropathy.  Also could represent lumbar radiculopathy symptoms.  We will proceed with further work-up.  Dx:  1. Neuropathy     PLAN:  BURNING FEET (neuropathy + lumbar radiculopathies) - check neuropathy labs - continue lyrica 113m daily + 2016mat night - start duloxetine 3053maily  Meds ordered this encounter  Medications  . DULoxetine (CYMBALTA) 30 MG capsule    Sig: Take 1 capsule (30 mg total) by mouth daily.    Dispense:  30 capsule    Refill:  6   Orders Placed This Encounter  Procedures  . Vitamin B12  . Hemoglobin A1c  . ANA,IFA RA Diag Pnl w/rflx Tit/Patn  . Multiple Myeloma Panel (SPEP&IFE w/QIG)   Return in about 8 months (around 09/22/2020) for with NP (Amy Lomax).    VIKPenni BombardD 01/29/26/0786:07:54 Certified in Neurology, Neurophysiology and Neuroimaging  [facility] [id number] [id number]

## 2020-01-26 LAB — ANA,IFA RA DIAG PNL W/RFLX TIT/PATN
ANA Titer 1: NEGATIVE
Cyclic Citrullin Peptide Ab: 6 units (ref 0–19)
Rheumatoid fact SerPl-aCnc: <10 IU/mL (ref 0.0–13.9)

## 2020-01-26 LAB — HEMOGLOBIN A1C
Est. average glucose Bld gHb Est-mCnc: 108 mg/dL
Hgb A1c MFr Bld: 5.4 % (ref 4.8–5.6)

## 2020-01-26 LAB — MULTIPLE MYELOMA PANEL, SERUM
Albumin SerPl Elph-Mcnc: 3.8 g/dL (ref 2.9–4.4)
Albumin/Glob SerPl: 1.2 (ref 0.7–1.7)
Alpha 1: 0.2 g/dL (ref 0.0–0.4)
Alpha2 Glob SerPl Elph-Mcnc: 0.8 g/dL (ref 0.4–1.0)
B-Globulin SerPl Elph-Mcnc: 1.2 g/dL (ref 0.7–1.3)
Gamma Glob SerPl Elph-Mcnc: 1 g/dL (ref 0.4–1.8)
Globulin, Total: 3.3 g/dL (ref 2.2–3.9)
IgA/Immunoglobulin A, Serum: 433 mg/dL (ref 61–437)
IgG (Immunoglobin G), Serum: 1151 mg/dL (ref 603–1613)
IgM (Immunoglobulin M), Srm: 69 mg/dL (ref 20–172)
Total Protein: 7.1 g/dL (ref 6.0–8.5)

## 2020-01-26 LAB — VITAMIN B12: Vitamin B-12: 369 pg/mL (ref 232–1245)

## 2020-01-31 ENCOUNTER — Telehealth: Payer: Self-pay | Admitting: *Deleted

## 2020-02-01 NOTE — Telephone Encounter (Signed)
Labs ok. VRP 

## 2020-02-02 NOTE — Telephone Encounter (Signed)
Donald Roberts advising his lab results are okay. Advised he continue taking Lyrica, duloxetine, call for any questions. Left #.

## 2020-03-17 ENCOUNTER — Other Ambulatory Visit: Payer: Self-pay | Admitting: Diagnostic Neuroimaging

## 2020-07-12 ENCOUNTER — Telehealth: Payer: Self-pay | Admitting: *Deleted

## 2020-07-12 ENCOUNTER — Other Ambulatory Visit: Payer: Self-pay | Admitting: *Deleted

## 2020-07-12 ENCOUNTER — Telehealth: Payer: Self-pay | Admitting: Cardiology

## 2020-07-12 DIAGNOSIS — Z87891 Personal history of nicotine dependence: Secondary | ICD-10-CM

## 2020-07-12 DIAGNOSIS — Z122 Encounter for screening for malignant neoplasm of respiratory organs: Secondary | ICD-10-CM

## 2020-07-12 NOTE — Telephone Encounter (Signed)
Spoke with patient about scheduling follow up with Crenshaw, patient stated he was told not to return because of something in his system, also stated he was prescribed medicine in [place] for back pain when he was stranded there for 24mos in the beginning of the pandemic I didn't ask any more questions and told patient if he needed appointment to call back

## 2020-07-12 NOTE — Telephone Encounter (Signed)
(  07/12/2020) Pt notified that lung cancer screening imaging is due currently or in the near future. Verified smoking history (Former Smoker since 2018, 1 ppd). Tentative appt for 08/17/20 @ 8:45 SRW

## 2020-08-17 ENCOUNTER — Other Ambulatory Visit: Payer: Self-pay

## 2020-08-17 ENCOUNTER — Ambulatory Visit
Admission: RE | Admit: 2020-08-17 | Discharge: 2020-08-17 | Disposition: A | Discharge status: Home / Self Care | Payer: BC Managed Care – PPO | Source: Ambulatory Visit | Attending: Nurse Practitioner | Admitting: Nurse Practitioner

## 2020-08-17 DIAGNOSIS — Z122 Encounter for screening for malignant neoplasm of respiratory organs: Secondary | ICD-10-CM | POA: Insufficient documentation

## 2020-08-17 DIAGNOSIS — Z87891 Personal history of nicotine dependence: Secondary | ICD-10-CM | POA: Insufficient documentation

## 2020-08-22 ENCOUNTER — Telehealth: Payer: Self-pay | Admitting: *Deleted

## 2020-08-22 NOTE — Telephone Encounter (Signed)
Noted. Aortic atherosclerosis and COPD  on problem list.

## 2020-08-22 NOTE — Telephone Encounter (Signed)
Notified patient of LDCT lung cancer screening program results with recommendation for 12 month follow up imaging. Also notified of incidental findings noted below and is encouraged to discuss further with PCP who will receive a copy of this note and/or the CT report. Patient verbalizes understanding.   IMPRESSION: 1. Lung-RADS 2, benign appearance or behavior. Continue annual screening with low-dose chest CT without contrast in 12 months. 2. Coronary artery calcifications. 3. Increase caliber of the ascending thoracic aorta which measures 4 cm. This can be readdressed at annual follow-up imaging.  Aortic Atherosclerosis (ICD10-I70.0) and Emphysema (ICD10-J43.9).

## 2020-09-01 NOTE — Progress Notes (Signed)
HPI: FU CAD.  Previously followed by Dr. Algie Coffer. Cardiac catheterization December 2019 showed 25% proximal LAD. Abdominal ultrasound February 2021 showed greater than 50% stenosis in the left common iliac and external iliac arteries but no abdominal aortic aneurysm.  Chest CT September 2021 showed coronary artery calcifications and 4 cm ascending thoracic aortic aneurysm.  Since last seen there is no dyspnea, chest pain, palpitations or syncope.  Current Outpatient Medications  Medication Sig Dispense Refill  . aspirin EC 81 MG EC tablet Take 1 tablet (81 mg total) by mouth daily.    . DULoxetine (CYMBALTA) 30 MG capsule TAKE 1 CAPSULE BY MOUTH EVERY DAY 90 capsule 1  . famotidine-calcium carbonate-magnesium hydroxide (PEPCID COMPLETE) 10-800-165 MG CHEW chewable tablet Chew 1 tablet by mouth daily.     . nitroGLYCERIN (NITROSTAT) 0.4 MG SL tablet Place 1 tablet (0.4 mg total) under the tongue every 5 (five) minutes as needed for chest pain. 25 tablet 12  . pravastatin (PRAVACHOL) 40 MG tablet Take 1 tablet (40 mg total) by mouth daily. 90 tablet 3  . pregabalin (LYRICA) 150 MG capsule Take 150 mg daily at night. (Patient taking differently: 2 (two) times daily. Take 150 mg daily at night.) 90 capsule 0  . ramipril (ALTACE) 5 MG capsule Take 1 capsule (5 mg total) by mouth daily. 90 capsule 3   No current facility-administered medications for this visit.     Past Medical History:  Diagnosis Date  . Abdominal pain, generalized 07/18/2008  . Abnormal gait   . ALLERGIC RHINITIS 07/18/2008  . Allergy   . Back pain, chronic 10/01/2011  . Carpal tunnel syndrome on left   . Chronic low back pain 12/25/2012  . COLONIC POLYPS, HX OF 07/18/2008  . CORONARY ARTERY DISEASE 07/18/2008  . Degenerative arthritis of right knee 10/01/2011  . DIVERTICULOSIS, COLON 07/18/2008  . Gross hematuria 07/18/2008  . Headache(784.0)    Hx: of Migraines as a child  . Heart murmur   . HYPERLIPIDEMIA 07/18/2008   . Hypertension   . Idiopathic progressive neuropathy   . PVD (peripheral vascular disease) ([facility])   . TOBACCO ABUSE 04/06/2009    Past Surgical History:  Procedure Laterality Date  . ANTERIOR CERVICAL DECOMP/DISCECTOMY FUSION N/A 09/10/2013   Procedure: ANTERIOR CERVICAL DECOMPRESSION/DISCECTOMY FUSION CERVICAL FOUR-FIVE,CERVICAL FIVE-SIX WITH PLATING AND BONE GRAFT;  Surgeon: Karn Cassis, MD;  Location: [facility];  Service: Neurosurgery;  Laterality: N/A;  . ANTERIOR CERVICAL DECOMP/DISCECTOMY FUSION N/A 03/14/2015   Procedure: ANTERIOR CERVICAL DECOMPRESSION/DISCECTOMY FUSION  CERVICAL TWO-THREE;  Surgeon: Hilda Lias, MD;  Location: [facility];  Service: Neurosurgery;  Laterality: N/A;  . CARDIAC CATHETERIZATION    . COLONOSCOPY    . COLONOSCOPY W/ BIOPSIES AND POLYPECTOMY     Hx: of  . LEFT HEART CATH AND CORONARY ANGIOGRAPHY N/A 11/04/2018   Procedure: LEFT HEART CATH AND CORONARY ANGIOGRAPHY;  Surgeon: Orpah Cobb, MD;  Location: [facility];  Service: Cardiovascular;  Laterality: N/A;  . lumbar surgury     dr botero/ns; 3 level fusion per pt  . POLYPECTOMY    . s/p cervical disease x 2      Social History   Socioeconomic History  . Marital status: Married    Spouse name: [name]  . Number of children: 1  . Years of education: Not on file  . Highest education level: High school graduate  Occupational History  . Occupation: Surveyor, minerals and Midwife    Comment:  semi retired  Tobacco Use  . Smoking status: Former Smoker    Packs/day: 1.00    Years: 40.00    Pack years: 40.00    Types: E-cigarettes    Quit date: 12/23/2016    Years since quitting: 3.7  . Smokeless tobacco: Never Used  . Tobacco comment: currently uses e cigarettes   Vaping Use  . Vaping Use: Every day  Substance and Sexual Activity  . Alcohol use: Yes    Comment: occasional  . Drug use: No  . Sexual activity: Not Currently  Other Topics Concern  . Not on file  Social  History Narrative   Married, 1 daughter lives close by.   Spends time at [employer] in [place].   Social Determinants of Health   Financial Resource Strain:   . Difficulty of Paying Living Expenses: Not on file  Food Insecurity:   . Worried About Programme researcher, broadcasting/film/video in the Last Year: Not on file  . Ran Out of Food in the Last Year: Not on file  Transportation Needs:   . Lack of Transportation (Medical): Not on file  . Lack of Transportation (Non-Medical): Not on file  Physical Activity:   . Days of Exercise per Week: Not on file  . Minutes of Exercise per Session: Not on file  Stress:   . Feeling of Stress : Not on file  Social Connections:   . Frequency of Communication with Friends and Family: Not on file  . Frequency of Social Gatherings with Friends and Family: Not on file  . Attends Religious Services: Not on file  . Active Member of Clubs or Organizations: Not on file  . Attends Banker Meetings: Not on file  . Marital Status: Not on file  Intimate Partner Violence:   . Fear of Current or Ex-Partner: Not on file  . Emotionally Abused: Not on file  . Physically Abused: Not on file  . Sexually Abused: Not on file    Family History  Problem Relation Age of Onset  . Heart attack Father   . Heart disease Brother   . Heart disease Mother   . Heart disease Other   . Parkinsonism Paternal Uncle   . Colon cancer Neg Hx   . Colon polyps Neg Hx   . Esophageal cancer Neg Hx   . Rectal cancer Neg Hx   . Stomach cancer Neg Hx     ROS: no fevers or chills, productive cough, hemoptysis, dysphasia, odynophagia, melena, hematochezia, dysuria, hematuria, rash, seizure activity, orthopnea, PND, pedal edema, claudication. Remaining systems are negative.  Physical Exam: Well-developed well-nourished in no acute distress.  Skin is warm and dry.  HEENT is normal.  Neck is supple.  Chest is clear to auscultation with normal expansion.  Cardiovascular exam is regular  rate and rhythm.  Abdominal exam nontender or distended. No masses palpated. Extremities show no edema. neuro grossly intact   A/P  1 coronary artery disease-no recurrent chest pain.  Plan to continue medical therapy with aspirin and statin.  2 hypertension-patient's blood pressure is controlled today.  Continue present medical regimen. Check BMET.  3 hyperlipidemia-given documented vascular disease we will discontinue pravastatin and instead treat with Crestor 40 mg daily. Check lipids and liver in 12 weeks.  4 dilated ascending thoracic aorta-plan follow-up CTA September 2022.  5 tobacco abuse-patient counseled on discontinuing.  6 peripheral vascular disease-patient denies claudication.  Plan to continue aspirin and statin.  Olga Millers, MD

## 2020-09-13 ENCOUNTER — Encounter: Payer: Self-pay | Admitting: Cardiology

## 2020-09-13 ENCOUNTER — Other Ambulatory Visit: Payer: Self-pay

## 2020-09-13 ENCOUNTER — Ambulatory Visit (INDEPENDENT_AMBULATORY_CARE_PROVIDER_SITE_OTHER): Payer: BC Managed Care – PPO | Admitting: Cardiology

## 2020-09-13 VITALS — BP 128/66 | HR 77 | Ht 70.0 in | Wt 152.0 lb

## 2020-09-13 DIAGNOSIS — E7800 Pure hypercholesterolemia, unspecified: Secondary | ICD-10-CM | POA: Diagnosis not present

## 2020-09-13 DIAGNOSIS — I10 Essential (primary) hypertension: Secondary | ICD-10-CM | POA: Diagnosis not present

## 2020-09-13 DIAGNOSIS — I2510 Atherosclerotic heart disease of native coronary artery without angina pectoris: Secondary | ICD-10-CM

## 2020-09-13 MED ORDER — ROSUVASTATIN CALCIUM 40 MG PO TABS: 40.0000 mg | ORAL_TABLET | Freq: Every day | ORAL | 3 refills | Status: DC

## 2020-09-13 NOTE — Patient Instructions (Signed)
Medication Instructions:   STOP PRAVASTATIN  START ROSUVASTATIN 40 MG ONCE DAILY  *If you need a refill on your cardiac medications before your next appointment, please call your pharmacy*   Lab Work:  Your physician recommends that you return for lab work in: 12 WEEKS-FASTING  If you have labs (blood work) drawn today and your tests are completely normal, you will receive your results only by: . MyChart Message (if you have MyChart) OR . A paper copy in the mail If you have any lab test that is abnormal or we need to change your treatment, we will call you to review the results.   Follow-Up: At [facility], you and your health needs are our priority.  As part of our continuing mission to provide you with exceptional heart care, we have created designated Provider Care Teams.  These Care Teams include your primary Cardiologist (physician) and Advanced Practice Providers (APPs -  Physician Assistants and Nurse Practitioners) who all work together to provide you with the care you need, when you need it.  We recommend signing up for the patient portal called "MyChart".  Sign up information is provided on this After Visit Summary.  MyChart is used to connect with patients for Virtual Visits (Telemedicine).  Patients are able to view lab/test results, encounter notes, upcoming appointments, etc.  Non-urgent messages can be sent to your provider as well.   To learn more about what you can do with MyChart, go to [email or web address].    Your next appointment:   12 month(s)  The format for your next appointment:   In Person  Provider:   Brian Crenshaw, MD    

## 2020-09-25 ENCOUNTER — Ambulatory Visit (INDEPENDENT_AMBULATORY_CARE_PROVIDER_SITE_OTHER): Payer: BC Managed Care – PPO | Admitting: Family Medicine

## 2020-09-25 ENCOUNTER — Other Ambulatory Visit: Payer: Self-pay

## 2020-09-25 ENCOUNTER — Encounter: Payer: Self-pay | Admitting: Family Medicine

## 2020-09-25 VITALS — BP 124/75 | HR 71 | Ht 70.0 in | Wt 153.0 lb

## 2020-09-25 DIAGNOSIS — G629 Polyneuropathy, unspecified: Secondary | ICD-10-CM | POA: Diagnosis not present

## 2020-09-25 MED ORDER — DULOXETINE HCL 60 MG PO CPEP: 60.0000 mg | ORAL_CAPSULE | Freq: Every day | ORAL | 3 refills | Status: DC

## 2020-09-25 NOTE — Progress Notes (Signed)
Chief Complaint  Patient presents with  . Follow-up    rm 1  . Peripheral Neuropathy    pt still has the same sx.     HISTORY OF PRESENT ILLNESS: Today 09/25/20  Donald Roberts is a 69 y.o. male here today for follow up for neuropathy. Neuropathy labs unremarkable at consult 01/2020. He continues Lyrica 150mg  BID prescribed by PCP. Cymbalta 30mg  added in 01/2020. He does not feel that that it has helped much. He denies adverse effects. Mood is good. Her did lose two sisters last week. He feels he is holding up well. He plans to leave for [facility] next month and will be there through the winter.    HISTORY (copied from Dr [facility] note on 01/31/2020)  69 year old male here for evaluation of burning feet.  Reports 3 to 4 years of numbness, pain and burning sensation in his bilateral feet.  Symptoms have been stable in the last year.  Also history of 5 low back surgeries as well as neck surgery.  Has had low B12 levels in the past.  Patient has tried gabapentin and Lyrica with mild relief.  Was under pain management in the past but no longer going there.    REVIEW OF SYSTEMS: Out of a complete 14 system review of symptoms, the patient complains only of the following symptoms, neuropathy and all other reviewed systems are negative.   ALLERGIES: No Known Allergies   HOME MEDICATIONS: Outpatient Medications Prior to Visit  Medication Sig Dispense Refill  . aspirin EC 81 MG EC tablet Take 1 tablet (81 mg total) by mouth daily.    . famotidine-calcium carbonate-magnesium hydroxide (PEPCID COMPLETE) 10-800-165 MG CHEW chewable tablet Chew 1 tablet by mouth daily.     . nitroGLYCERIN (NITROSTAT) 0.4 MG SL tablet Place 1 tablet (0.4 mg total) under the tongue every 5 (five) minutes as needed for chest pain. 25 tablet 12  . pregabalin (LYRICA) 150 MG capsule Take 150 mg daily at night. (Patient taking differently: 2 (two) times daily. Take 150 mg daily at night.) 90 capsule 0  .  ramipril (ALTACE) 5 MG capsule Take 1 capsule (5 mg total) by mouth daily. 90 capsule 3  . rosuvastatin (CRESTOR) 40 MG tablet Take 1 tablet (40 mg total) by mouth daily. 90 tablet 3  . DULoxetine (CYMBALTA) 30 MG capsule TAKE 1 CAPSULE BY MOUTH EVERY DAY 90 capsule 1   No facility-administered medications prior to visit.     PAST MEDICAL HISTORY: Past Medical History:  Diagnosis Date  . Abdominal pain, generalized 07/18/2008  . Abnormal gait   . ALLERGIC RHINITIS 07/18/2008  . Allergy   . Back pain, chronic 10/01/2011  . Carpal tunnel syndrome on left   . Chronic low back pain 12/25/2012  . COLONIC POLYPS, HX OF 07/18/2008  . CORONARY ARTERY DISEASE 07/18/2008  . Degenerative arthritis of right knee 10/01/2011  . DIVERTICULOSIS, COLON 07/18/2008  . Gross hematuria 07/18/2008  . Headache(784.0)    Hx: of Migraines as a child  . Heart murmur   . HYPERLIPIDEMIA 07/18/2008  . Hypertension   . Idiopathic progressive neuropathy   . PVD (peripheral vascular disease) (HCC)   . TOBACCO ABUSE 04/06/2009     PAST SURGICAL HISTORY: Past Surgical History:  Procedure Laterality Date  . ANTERIOR CERVICAL DECOMP/DISCECTOMY FUSION N/A 09/10/2013   Procedure: ANTERIOR CERVICAL DECOMPRESSION/DISCECTOMY FUSION CERVICAL FOUR-FIVE,CERVICAL FIVE-SIX WITH PLATING AND BONE GRAFT;  Surgeon: 06/06/2009, MD;  Location: [facility] NEURO  [facility];  Service: Neurosurgery;  Laterality: N/A;  . ANTERIOR CERVICAL DECOMP/DISCECTOMY FUSION N/A 03/14/2015   Procedure: ANTERIOR CERVICAL DECOMPRESSION/DISCECTOMY FUSION  CERVICAL TWO-THREE;  Surgeon: Hilda LiasErnesto Botero, MD;  Location: [facility] NEURO ORS;  Service: Neurosurgery;  Laterality: N/A;  . CARDIAC CATHETERIZATION    . COLONOSCOPY    . COLONOSCOPY W/ BIOPSIES AND POLYPECTOMY     Hx: of  . LEFT HEART CATH AND CORONARY ANGIOGRAPHY N/A 11/04/2018   Procedure: LEFT HEART CATH AND CORONARY ANGIOGRAPHY;  Surgeon: Orpah CobbKadakia, Ajay, MD;  Location: [facility] INVASIVE CV LAB;  Service:  Cardiovascular;  Laterality: N/A;  . lumbar surgury     dr botero/ns; 3 level fusion per pt  . POLYPECTOMY    . s/p cervical disease x 2       FAMILY HISTORY: Family History  Problem Relation Age of Onset  . Heart attack Father   . Heart disease Brother   . Heart disease Mother   . Heart disease Other   . Parkinsonism Paternal Uncle   . Colon cancer Neg Hx   . Colon polyps Neg Hx   . Esophageal cancer Neg Hx   . Rectal cancer Neg Hx   . Stomach cancer Neg Hx      SOCIAL HISTORY: Social History   Socioeconomic History  . Marital status: Married    Spouse name: [name]  . Number of children: 1  . Years of education: Not on file  . Highest education level: High school graduate  Occupational History  . Occupation: Surveyor, mineralscontractor and Midwifewood salesman    Comment: semi retired  Tobacco Use  . Smoking status: Former Smoker    Packs/day: 1.00    Years: 40.00    Pack years: 40.00    Types: E-cigarettes    Quit date: 12/23/2016    Years since quitting: 3.7  . Smokeless tobacco: Never Used  . Tobacco comment: currently uses e cigarettes   Vaping Use  . Vaping Use: Every day  Substance and Sexual Activity  . Alcohol use: Yes    Comment: occasional  . Drug use: No  . Sexual activity: Not Currently  Other Topics Concern  . Not on file  Social History Narrative   Married, 1 daughter lives close by.   Spends time at [employer] [place].   Social Determinants of Health   Financial Resource Strain:   . Difficulty of Paying Living Expenses: Not on file  Food Insecurity:   . Worried About Programme researcher, broadcasting/film/videounning Out of Food in the Last Year: Not on file  . Ran Out of Food in the Last Year: Not on file  Transportation Needs:   . Lack of Transportation (Medical): Not on file  . Lack of Transportation (Non-Medical): Not on file  Physical Activity:   . Days of Exercise per Week: Not on file  . Minutes of Exercise per Session: Not on file  Stress:   . Feeling of Stress : Not on file  Social  Connections:   . Frequency of Communication with Friends and Family: Not on file  . Frequency of Social Gatherings with Friends and Family: Not on file  . Attends Religious Services: Not on file  . Active Member of Clubs or Organizations: Not on file  . Attends BankerClub or Organization Meetings: Not on file  . Marital Status: Not on file  Intimate Partner Violence:   . Fear of Current or Ex-Partner: Not on file  . Emotionally Abused: Not on file  . Physically Abused: Not on  file  . Sexually Abused: Not on file      PHYSICAL EXAM  Vitals:   09/25/20 1309  BP: 124/75  Pulse: 71  Weight: 153 lb (69.4 kg)  Height: 5\' 10"  (1.778 m)   Body mass index is 21.95 kg/m.   Generalized: Well developed, in no acute distress   Neurological examination  Mentation: Alert oriented to time, place, history taking. Follows all commands speech and language fluent Cranial nerve II-XII: Pupils were equal round reactive to light. Extraocular movements were full, visual field were full on confrontational test. Facial sensation and strength were normal. Head turning and shoulder shrug  were normal and symmetric. Motor: The motor testing reveals 5 over 5 strength of all 4 extremities. Good symmetric motor tone is noted throughout.  Sensory: Sensory testing is intact to soft touch on all 4 extremities with exception of decreased sensation of plantar surface of bilateral feet. No evidence of extinction is noted.   Gait and station: Gait is normal.     DIAGNOSTIC DATA (LABS, IMAGING, TESTING) - I reviewed patient records, labs, notes, testing and imaging myself where available.  Lab Results  Component Value Date   WBC 7.2 06/15/2019   HGB 15.0 06/15/2019   HCT 44.9 06/15/2019   MCV 87.2 06/15/2019   PLT 252.0 06/15/2019      Component Value Date/Time   NA 137 10/29/2018 0545   NA 139 04/01/2018 1125   K 3.9 10/29/2018 0545   CL 106 10/29/2018 0545   CO2 22 10/29/2018 0545   GLUCOSE 93  10/29/2018 0545   BUN 17 10/29/2018 0545   BUN 6 (L) 04/01/2018 1125   CREATININE 1.02 10/29/2018 0545   CALCIUM 8.8 (L) 10/29/2018 0545   PROT 7.1 01/24/2020 1337   ALBUMIN 4.0 01/06/2020 0946   AST 17 01/06/2020 0946   ALT 10 01/06/2020 0946   ALKPHOS 62 01/06/2020 0946   BILITOT 0.3 01/06/2020 0946   GFRNONAA >60 10/29/2018 0545   GFRAA >60 10/29/2018 0545   Lab Results  Component Value Date   CHOL 136 01/06/2020   HDL 52 01/06/2020   LDLCALC 72 01/06/2020   TRIG 58 01/06/2020   CHOLHDL 2.6 01/06/2020   Lab Results  Component Value Date   HGBA1C 5.4 01/24/2020   Lab Results  Component Value Date   VITAMINB12 369 01/24/2020   Lab Results  Component Value Date   TSH 0.82 06/15/2019      ASSESSMENT AND PLAN  69 y.o. year old male  has a past medical history of Abdominal pain, generalized (07/18/2008), Abnormal gait, ALLERGIC RHINITIS (07/18/2008), Allergy, Back pain, chronic (10/01/2011), Carpal tunnel syndrome on left, Chronic low back pain (12/25/2012), COLONIC POLYPS, HX OF (07/18/2008), CORONARY ARTERY DISEASE (07/18/2008), Degenerative arthritis of right knee (10/01/2011), DIVERTICULOSIS, COLON (07/18/2008), Gross hematuria (07/18/2008), Headache(784.0), Heart murmur, HYPERLIPIDEMIA (07/18/2008), Hypertension, Idiopathic progressive neuropathy, PVD (peripheral vascular disease) (HCC), and TOBACCO ABUSE (04/06/2009). here with   Neuropathy   He is doing well, today. He does continue to have constant burning pain. He will continue Lyrica 150mg  BID as prescribed by PCP. We will increase duloxetine to 60mg  daily. He was advised to monitor for any new or worsening symptoms. I will also call in compounded neuropathy cream. He was directed on appropriate administration. Healthy lifestyle habits encouraged. He will continue close follow up with PCP and cardiology. He will follow up with me in 6 months.   I spent 20 minutes of face-to-face and non-face-to-face time with patient.  This  included previsit chart review, lab review, study review, order entry, electronic health record documentation, patient education.    Shawnie Dapper, MSN, FNP-C 09/25/2020, 1:38 PM  [facility] [address], [address] [place], [place] [address] [phone]

## 2020-09-25 NOTE — Patient Instructions (Addendum)
We will increase duloxetine (Cymbalta) to 60mg  daily. I will call in a compounded neuropathy cream. You will get a phone call from a 1-800 number. Please make sure to respond. You will apply this to your feet topically as directed.   Stay well hydrated. Well balanced diet and regular exercise.   Follow up with me in 6 months.   Neuropathic Pain Neuropathic pain is pain caused by damage to the nerves that are responsible for certain sensations in your body (sensory nerves). The pain can be caused by:  Damage to the sensory nerves that send signals to your spinal cord and brain (peripheral nervous system).  Damage to the sensory nerves in your brain or spinal cord (central nervous system). Neuropathic pain can make you more sensitive to pain. Even a minor sensation can feel very painful. This is usually a long-term condition that can be difficult to treat. The type of pain differs from person to person. It may:  Start suddenly (acute), or it may develop slowly and last for a long time (chronic).  Come and go as damaged nerves heal, or it may stay at the same level for years.  Cause emotional distress, loss of sleep, and a lower quality of life. What are the causes? The most common cause of this condition is diabetes. Many other diseases and conditions can also cause neuropathic pain. Causes of neuropathic pain can be classified as:  Toxic. This is caused by medicines and chemicals. The most common cause of toxic neuropathic pain is damage from cancer treatments (chemotherapy).  Metabolic. This can be caused by: ? Diabetes. This is the most common disease that damages the nerves. ? Lack of vitamin B from long-term alcohol abuse.  Traumatic. Any injury that cuts, crushes, or stretches a nerve can cause damage and pain. A common example is feeling pain after losing an arm or leg (phantom limb pain).  Compression-related. If a sensory nerve gets trapped or compressed for a long period of  time, the blood supply to the nerve can be cut off.  Vascular. Many blood vessel diseases can cause neuropathic pain by decreasing blood supply and oxygen to nerves.  Autoimmune. This type of pain results from diseases in which the body's defense system (immune system) mistakenly attacks sensory nerves. Examples of autoimmune diseases that can cause neuropathic pain include lupus and multiple sclerosis.  Infectious. Many types of viral infections can damage sensory nerves and cause pain. Shingles infection is a common cause of this type of pain.  Inherited. Neuropathic pain can be a symptom of many diseases that are passed down through families (genetic). What increases the risk? You are more likely to develop this condition if:  You have diabetes.  You smoke.  You drink too much alcohol.  You are taking certain medicines, including medicines that kill cancer cells (chemotherapy) or that treat immune system disorders. What are the signs or symptoms? The main symptom is pain. Neuropathic pain is often described as:  Burning.  Shock-like.  Stinging.  Hot or cold.  Itching. How is this diagnosed? No single test can diagnose neuropathic pain. It is diagnosed based on:  Physical exam and your symptoms. Your health care provider will ask you about your pain. You may be asked to use a pain scale to describe how bad your pain is.  Tests. These may be done to see if you have a high sensitivity to pain and to help find the cause and location of any sensory nerve damage. They  include: ? Nerve conduction studies to test how well nerve signals travel through your sensory nerves (electrodiagnostic testing). ? Stimulating your sensory nerves through electrodes on your skin and measuring the response in your spinal cord and brain (somatosensory evoked potential).  Imaging studies, such as: ? X-rays. ? CT scan. ? MRI. How is this treated? Treatment for neuropathic pain may change over  time. You may need to try different treatment options or a combination of treatments. Some options include:  Treating the underlying cause of the neuropathy, such as diabetes, kidney disease, or vitamin deficiencies.  Stopping medicines that can cause neuropathy, such as chemotherapy.  Medicine to relieve pain. Medicines may include: ? Prescription or over-the-counter pain medicine. ? Anti-seizure medicine. ? Antidepressant medicines. ? Pain-relieving patches that are applied to painful areas of skin. ? A medicine to numb the area (local anesthetic), which can be injected as a nerve block.  Transcutaneous nerve stimulation. This uses electrical currents to block painful nerve signals. The treatment is painless.  Alternative treatments, such as: ? Acupuncture. ? Meditation. ? Massage. ? Physical therapy. ? Pain management programs. ? Counseling. Follow these instructions at home: Medicines   Take over-the-counter and prescription medicines only as told by your health care provider.  Do not drive or use heavy machinery while taking prescription pain medicine.  If you are taking prescription pain medicine, take actions to prevent or treat constipation. Your health care provider may recommend that you: ? Drink enough fluid to keep your urine pale yellow. ? Eat foods that are high in fiber, such as fresh fruits and vegetables, whole grains, and beans. ? Limit foods that are high in fat and processed sugars, such as fried or sweet foods. ? Take an over-the-counter or prescription medicine for constipation. Lifestyle   Have a good support system at home.  Consider joining a chronic pain support group.  Do not use any products that contain nicotine or tobacco, such as cigarettes and e-cigarettes. If you need help quitting, ask your health care provider.  Do not drink alcohol. General instructions  Learn as much as you can about your condition.  Work closely with all your  health care providers to find the treatment plan that works best for you.  Ask your health care provider what activities are safe for you.  Keep all follow-up visits as told by your health care provider. This is important. Contact a health care provider if:  Your pain treatments are not working.  You are having side effects from your medicines.  You are struggling with tiredness (fatigue), mood changes, depression, or anxiety. Summary  Neuropathic pain is pain caused by damage to the nerves that are responsible for certain sensations in your body (sensory nerves).  Neuropathic pain may come and go as damaged nerves heal, or it may stay at the same level for years.  Neuropathic pain is usually a long-term condition that can be difficult to treat. Consider joining a chronic pain support group. This information is not intended to replace advice given to you by your health care provider. Make sure you discuss any questions you have with your health care provider. Document Revised: 03/11/2019 Document Reviewed: 12/05/2017 Elsevier Patient Education  2020 [employer].

## 2020-09-25 NOTE — Progress Notes (Signed)
I reviewed note and agree with plan.   Jakeim Sedore R. Tinesha Siegrist, MD 09/25/2020, 5:01 PM Certified in Neurology, Neurophysiology and Neuroimaging  [facility] [address], [address] [place], [place] [address] [phone]  

## 2020-09-27 ENCOUNTER — Telehealth: Payer: Self-pay | Admitting: *Deleted

## 2020-09-27 NOTE — Telephone Encounter (Signed)
[employer] fax received for neuropathy cream [phone], fax [phone].

## 2020-09-28 ENCOUNTER — Other Ambulatory Visit: Payer: Self-pay | Admitting: Neurosurgery

## 2020-09-29 NOTE — Progress Notes (Signed)
PCP:  Kerby Nora, MD Cardiologist:  Olga Millers, MD  EKG:  12/23/19 CXR:  09/08/13 ECHO:  Denies Stress Test:  Denies Cardiac Cath:  11/04/18  Covid test: 09/30/20  Blood Thinners:  ASA - last dose 09/28/20  Anesthesia Review:  Yes, per chart CAD.  Patient denies shortness of breath, fever, cough, and chest pain at PAT appointment.  Patient verbalized understanding of instructions provided today at the PAT appointment.  Patient asked to review instructions at home and day of surgery.

## 2020-09-30 ENCOUNTER — Other Ambulatory Visit (HOSPITAL_COMMUNITY)
Admission: RE | Admit: 2020-09-30 | Discharge: 2020-09-30 | Disposition: A | Discharge status: Home / Self Care | Payer: BC Managed Care – PPO | Source: Ambulatory Visit | Attending: Neurosurgery | Admitting: Neurosurgery

## 2020-09-30 DIAGNOSIS — Z20822 Contact with and (suspected) exposure to covid-19: Secondary | ICD-10-CM | POA: Insufficient documentation

## 2020-09-30 DIAGNOSIS — Z01812 Encounter for preprocedural laboratory examination: Secondary | ICD-10-CM | POA: Insufficient documentation

## 2020-10-01 LAB — SARS CORONAVIRUS 2 (TAT 6-24 HRS): SARS Coronavirus 2: NEGATIVE

## 2020-10-02 NOTE — Anesthesia Preprocedure Evaluation (Addendum)
Anesthesia Evaluation  Patient identified by MRN, date of birth, ID band Patient awake    Reviewed: Allergy & Precautions, NPO status , Patient's Chart, lab work & pertinent test results  Airway Mallampati: I  TM Distance: >3 FB Neck ROM: Full    Dental   Pulmonary Current Smoker,    Pulmonary exam normal        Cardiovascular hypertension, Pt. on medications + CAD  Normal cardiovascular exam     Neuro/Psych    GI/Hepatic   Endo/Other    Renal/GU      Musculoskeletal   Abdominal   Peds  Hematology   Anesthesia Other Findings   Reproductive/Obstetrics                             Anesthesia Physical Anesthesia Plan  ASA: III  Anesthesia Plan: General   Post-op Pain Management:    Induction: Intravenous  PONV Risk Score and Plan: 2 and Ondansetron and Midazolam  Airway Management Planned: Oral ETT  Additional Equipment:   Intra-op Plan:   Post-operative Plan: Extubation in OR  Informed Consent: I have reviewed the patients History and Physical, chart, labs and discussed the procedure including the risks, benefits and alternatives for the proposed anesthesia with the patient or authorized representative who has indicated his/her understanding and acceptance.       Plan Discussed with: CRNA and Surgeon  Anesthesia Plan Comments: (PAT note written 10/02/2020 by Shonna Chock, PA-C. )       Anesthesia Quick Evaluation

## 2020-10-02 NOTE — Progress Notes (Signed)
Anesthesia Chart Review:  Case: [id number] Date/Time: 10/03/20 0745   Procedure: ACDF - C3-C4 (N/A ) - 3C   Anesthesia type: General   Pre-op diagnosis: Stenosis   [facility] / [facility]   Surgeons: Julio Sicks, MD      DISCUSSION: Patient is a 69 year old scheduled for the above procedure.   History includes former smoker (quit 12/23/16), CAD (small vessels with 25% pLAD 2019, medical therapy), HLD, murmur (denied prior echo; "no murmurs" documented 12/23/19 by cardiologist Dr. Jens Som), HTN, idiopathic progressive neuropathy,chronic back pain, PVD (> 50% LCIA and LEIA stenosis 01/2020 [place]), abnormal gait, cervical fusion (C5-6 fusion 1996, C6-7 ACDF 04/25/04; C2-3 ACDF 03/15/15), back surgery (L2-6 fusion 04/04/11), hematuria (2009).   Continued medical therapy recommended at 09/13/20 cardiologist visit with Dr. Jens Som. His ascending thoracic aorta was 4 cm on 08/17/20 CT, so one year follow-up planned. Last ASA 09/28/20. He denied chest pain and SOB per PAT RN phone interview.  09/30/2020 presurgical COVID-19 test negative.  He is a same-day work-up, so labs and anesthesia team evaluation on the day of surgery.   VS:  BP Readings from Last 3 Encounters:  09/25/20 124/75  09/13/20 128/66  01/24/20 (!) 144/75   Pulse Readings from Last 3 Encounters:  09/25/20 71  09/13/20 77  01/24/20 (!) 57    PROVIDERS: Excell Seltzer, MD is PCP  Olga Millers, MD is cardiologist (previuosly followed by Orpah Cobb, MD and Georga Hacking, MD). Last visit 09/13/20.  Joycelyn Schmid, MD is neurologist. Last evaluation 09/25/20 by Shawnie Dapper, NP.   LABS: For day of procedure.   IMAGES: CT Chest (lung cancer screen) 08/17/20: IMPRESSION: 1. Lung-RADS 2, benign appearance or behavior. Continue annual screening with low-dose chest CT without contrast in 12 months. 2. Coronary artery calcifications. 3. Increase caliber of the ascending thoracic aorta which measures 4 cm. This can be  readdressed at annual follow-up imaging. - Aortic Atherosclerosis (ICD10-I70.0) and Emphysema (ICD10-J43.9).  MRI L-spine 08/18/18: IMPRESSION: - Satisfactory appearance in the fusion segment from L2 through L5. Central canal widely patent. Foramina widely patent at L2-3 and L3-4. Mild chronic foraminal narrowing at L4-5. - Minor degenerative changes at L1-2 and L5-S1 but without evidence of stenosis, neural compression or edematous facet arthropathy. 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   EKG: 12/23/19: Sinus bradycardia 56 bpm Septal infarct, age undetermined   CV: US Abdominal Aorta 01/04/20: Summary:  - Abdominal Aorta:  No evidence of an abdominal aortic aneurysm was visualized.  The largest aortic measurement is 2.4 cm.  - Atherosclerosis in the aorta and iliac arteries.  >50% stenosis in the left common iliac and external iliac arteries. <50% stenosis in the right common iliac and external iliac arteries.    Cardiac cath 11/04/18: Left Main: Vessel is small. Short length Vessel is angiographically normal. The vessel is mildly calcified. Left Anterior Descending: Vessel is small. There is mild focal disease in the vessel. Ost LAD to Prox LAD lesion is 25% stenosed. Vessel is not the culprit lesion. The lesion is type A and concentric. The lesion is mildly calcified. The lesion was not previously treated. The stenosis was measured by a visual reading. Pressure wire/FFR was not performed on the lesion. IVUS was not performed. Ramus Intermedius: Vessel is small. Vessel is angiographically normal. Left Circumflex: Vessel is large.  The vessel exhibits minimal luminal irregularities. Right coronary artery:  Vessel is small.  Superior takeoff.   Past Medical History:  Diagnosis Date  . Abdominal pain, generalized 07/18/2008  . Abnormal gait   . ALLERGIC RHINITIS 07/18/2008  . Allergy   .  Back pain, chronic 10/01/2011  . Carpal tunnel syndrome on left   . Chronic low back pain 12/25/2012  . COLONIC POLYPS, HX OF 07/18/2008  . CORONARY ARTERY DISEASE 07/18/2008  . Degenerative arthritis of right knee 10/01/2011  . DIVERTICULOSIS, COLON 07/18/2008  . Gross hematuria 07/18/2008  . Headache(784.0)    Hx: of Migraines as a child  . Heart murmur   . HYPERLIPIDEMIA 07/18/2008  . Hypertension   . Idiopathic progressive neuropathy   . PVD (peripheral vascular disease) (HCC)   . TOBACCO ABUSE 04/06/2009    Past Surgical History:  Procedure Laterality Date  . ANTERIOR CERVICAL DECOMP/DISCECTOMY FUSION N/A 09/10/2013   Procedure: ANTERIOR CERVICAL DECOMPRESSION/DISCECTOMY FUSION CERVICAL FOUR-FIVE,CERVICAL FIVE-SIX WITH PLATING AND BONE GRAFT;  Surgeon: Karn Cassis, MD;  Location[facility];  Service: Neurosurgery;  Laterality: N/A;  . ANTERIOR CERVICAL DECOMP/DISCECTOMY FUSION N/A 03/14/2015   Procedure: ANTERIOR CERVICAL DECOMPRESSION/DISCECTOMY FUSION  CERVICAL TWO-THREE;  Surgeon: Hilda Lias, MD;  Location[facility];  Service: Neurosurgery;  Laterality: N/A;  . CARDIAC CATHETERIZATION    . COLONOSCOPY    . COLONOSCOPY W/ BIOPSIES AND POLYPECTOMY     Hx: of  . LEFT HEART CATH AND CORONARY ANGIOGRAPHY N/A 11/04/2018   Procedure: LEFT HEART CATH AND CORONARY ANGIOGRAPHY;  Surgeon: Orpah Cobb, MD;  [facility];  Service: Cardiovascular;  Laterality: N/A;  . lumbar surgury     dr botero/ns; 3 level fusion per pt  . POLYPECTOMY    . s/p cervical disease x 2      MEDICATIONS: No current facility-administered medications for this encounter.   Marland Kitchen aspirin EC 81 MG EC tablet  . HYDROcodone-acetaminophen (NORCO) 10-325 MG tablet  . nitroGLYCERIN (NITROSTAT) 0.4 MG SL tablet  . pregabalin (LYRICA) 150 MG capsule  . ramipril (ALTACE) 5 MG capsule  . rosuvastatin (CRESTOR) 40 MG tablet  . DULoxetine (CYMBALTA) 60 MG capsule    Shonna Chock,  PA-C Surgical Short Stay/Anesthesiology [facility] Phone [phone] [facility] Phone [phone] 10/02/2020 10:43 AM

## 2020-10-03 ENCOUNTER — Observation Stay (HOSPITAL_COMMUNITY)
Admission: RE | Admit: 2020-10-03 | Discharge: 2020-10-03 | Disposition: A | Discharge status: Home / Self Care | Payer: BC Managed Care – PPO | Attending: Neurosurgery | Admitting: Neurosurgery

## 2020-10-03 ENCOUNTER — Encounter (HOSPITAL_COMMUNITY): Payer: Self-pay | Admitting: Neurosurgery

## 2020-10-03 ENCOUNTER — Ambulatory Visit (HOSPITAL_COMMUNITY): Payer: BC Managed Care – PPO

## 2020-10-03 ENCOUNTER — Ambulatory Visit (HOSPITAL_COMMUNITY): Payer: BC Managed Care – PPO | Admitting: Vascular Surgery

## 2020-10-03 ENCOUNTER — Other Ambulatory Visit: Payer: Self-pay

## 2020-10-03 ENCOUNTER — Encounter (HOSPITAL_COMMUNITY)
Admission: RE | Disposition: A | Discharge status: Home / Self Care | Payer: Self-pay | Source: Home / Self Care | Attending: Neurosurgery

## 2020-10-03 DIAGNOSIS — I10 Essential (primary) hypertension: Secondary | ICD-10-CM | POA: Insufficient documentation

## 2020-10-03 DIAGNOSIS — F17210 Nicotine dependence, cigarettes, uncomplicated: Secondary | ICD-10-CM | POA: Insufficient documentation

## 2020-10-03 DIAGNOSIS — Z7982 Long term (current) use of aspirin: Secondary | ICD-10-CM | POA: Diagnosis not present

## 2020-10-03 DIAGNOSIS — M4712 Other spondylosis with myelopathy, cervical region: Principal | ICD-10-CM | POA: Insufficient documentation

## 2020-10-03 DIAGNOSIS — Z86010 Personal history of colonic polyps: Secondary | ICD-10-CM | POA: Diagnosis not present

## 2020-10-03 DIAGNOSIS — M542 Cervicalgia: Secondary | ICD-10-CM | POA: Diagnosis present

## 2020-10-03 DIAGNOSIS — Z419 Encounter for procedure for purposes other than remedying health state, unspecified: Secondary | ICD-10-CM

## 2020-10-03 DIAGNOSIS — Z79899 Other long term (current) drug therapy: Secondary | ICD-10-CM | POA: Insufficient documentation

## 2020-10-03 DIAGNOSIS — M4802 Spinal stenosis, cervical region: Secondary | ICD-10-CM | POA: Diagnosis not present

## 2020-10-03 DIAGNOSIS — G959 Disease of spinal cord, unspecified: Secondary | ICD-10-CM | POA: Diagnosis present

## 2020-10-03 HISTORY — PX: ANTERIOR CERVICAL DECOMP/DISCECTOMY FUSION: SHX1161

## 2020-10-03 LAB — CBC WITH DIFFERENTIAL/PLATELET
Abs Immature Granulocytes: 0.04 10*3/uL (ref 0.00–0.07)
Basophils Absolute: 0 10*3/uL (ref 0.0–0.1)
Basophils Relative: 0 %
Eosinophils Absolute: 0.2 10*3/uL (ref 0.0–0.5)
Eosinophils Relative: 2 %
HCT: 39.3 % (ref 39.0–52.0)
Hemoglobin: 12.8 g/dL — ABNORMAL LOW (ref 13.0–17.0)
Immature Granulocytes: 0 %
Lymphocytes Relative: 19 %
Lymphs Abs: 1.8 10*3/uL (ref 0.7–4.0)
MCH: 29.8 pg (ref 26.0–34.0)
MCHC: 32.6 g/dL (ref 30.0–36.0)
MCV: 91.6 fL (ref 80.0–100.0)
Monocytes Absolute: 0.9 10*3/uL (ref 0.1–1.0)
Monocytes Relative: 10 %
Neutro Abs: 6.3 10*3/uL (ref 1.7–7.7)
Neutrophils Relative %: 69 %
Platelets: 282 10*3/uL (ref 150–400)
RBC: 4.29 MIL/uL (ref 4.22–5.81)
RDW: 13.8 % (ref 11.5–15.5)
WBC: 9.2 10*3/uL (ref 4.0–10.5)
nRBC: 0 % (ref 0.0–0.2)

## 2020-10-03 LAB — BASIC METABOLIC PANEL
Anion gap: 10 (ref 5–15)
BUN: 11 mg/dL (ref 8–23)
CO2: 25 mmol/L (ref 22–32)
Calcium: 9 mg/dL (ref 8.9–10.3)
Chloride: 104 mmol/L (ref 98–111)
Creatinine, Ser: 0.92 mg/dL (ref 0.61–1.24)
GFR, Estimated: >60 mL/min (ref >60)
Glucose, Bld: 87 mg/dL (ref 70–99)
Potassium: 3.8 mmol/L (ref 3.5–5.1)
Sodium: 139 mmol/L (ref 135–145)

## 2020-10-03 LAB — SURGICAL PCR SCREEN
MRSA, PCR: NEGATIVE
Staphylococcus aureus: POSITIVE — AB

## 2020-10-03 SURGERY — ANTERIOR CERVICAL DECOMPRESSION/DISCECTOMY FUSION 1 LEVEL
Anesthesia: General

## 2020-10-03 MED ORDER — ROSUVASTATIN CALCIUM 20 MG PO TABS: 40.0000 mg | ORAL_TABLET | Freq: Every day | ORAL | Status: DC

## 2020-10-03 MED ORDER — PHENOL 1.4 % MT LIQD: 1.0000 | OROMUCOSAL | Status: DC | PRN

## 2020-10-03 MED ORDER — ROCURONIUM BROMIDE 10 MG/ML (PF) SYRINGE
PREFILLED_SYRINGE | INTRAVENOUS | Status: DC | PRN
  Administered 2020-10-03: 20 mg via INTRAVENOUS
  Administered 2020-10-03: 50 mg via INTRAVENOUS

## 2020-10-03 MED ORDER — 0.9 % SODIUM CHLORIDE (POUR BTL) OPTIME
TOPICAL | Status: DC | PRN
  Administered 2020-10-03: 1000 mL

## 2020-10-03 MED ORDER — ONDANSETRON HCL 4 MG/2ML IJ SOLN
INTRAMUSCULAR | Status: AC
  Filled 2020-10-03: qty 2

## 2020-10-03 MED ORDER — PROPOFOL 10 MG/ML IV BOLUS
INTRAVENOUS | Status: AC
  Filled 2020-10-03: qty 40

## 2020-10-03 MED ORDER — ONDANSETRON HCL 4 MG/2ML IJ SOLN: 4.0000 mg | Freq: Once | INTRAMUSCULAR | Status: DC | PRN

## 2020-10-03 MED ORDER — MIDAZOLAM HCL 2 MG/2ML IJ SOLN
INTRAMUSCULAR | Status: AC
  Filled 2020-10-03: qty 2

## 2020-10-03 MED ORDER — HYDROCODONE-ACETAMINOPHEN 5-325 MG PO TABS: 1.0000 | ORAL_TABLET | ORAL | Status: DC | PRN

## 2020-10-03 MED ORDER — ORAL CARE MOUTH RINSE: 15.0000 mL | Freq: Once | OROMUCOSAL | Status: AC

## 2020-10-03 MED ORDER — DEXAMETHASONE SODIUM PHOSPHATE 10 MG/ML IJ SOLN
INTRAMUSCULAR | Status: AC
  Filled 2020-10-03: qty 1

## 2020-10-03 MED ORDER — HYDROMORPHONE HCL 1 MG/ML IJ SOLN
INTRAMUSCULAR | Status: AC
  Filled 2020-10-03: qty 1

## 2020-10-03 MED ORDER — HYDROMORPHONE HCL 1 MG/ML IJ SOLN
1.0000 mg | INTRAMUSCULAR | Status: DC | PRN
  Administered 2020-10-03: 1 mg via INTRAVENOUS

## 2020-10-03 MED ORDER — ONDANSETRON HCL 4 MG PO TABS: 4.0000 mg | ORAL_TABLET | Freq: Four times a day (QID) | ORAL | Status: DC | PRN

## 2020-10-03 MED ORDER — ONDANSETRON HCL 4 MG/2ML IJ SOLN: 4.0000 mg | Freq: Four times a day (QID) | INTRAMUSCULAR | Status: DC | PRN

## 2020-10-03 MED ORDER — SODIUM CHLORIDE 0.9% FLUSH: 3.0000 mL | Freq: Two times a day (BID) | INTRAVENOUS | Status: DC

## 2020-10-03 MED ORDER — LIDOCAINE 2% (20 MG/ML) 5 ML SYRINGE
INTRAMUSCULAR | Status: DC | PRN
  Administered 2020-10-03: 60 mg via INTRAVENOUS

## 2020-10-03 MED ORDER — RAMIPRIL 5 MG PO CAPS
5.0000 mg | ORAL_CAPSULE | Freq: Every day | ORAL | Status: DC
  Administered 2020-10-03: 5 mg via ORAL
  Filled 2020-10-03: qty 1

## 2020-10-03 MED ORDER — EPHEDRINE SULFATE-NACL 50-0.9 MG/10ML-% IV SOSY
PREFILLED_SYRINGE | INTRAVENOUS | Status: DC | PRN
  Administered 2020-10-03: 5 mg via INTRAVENOUS

## 2020-10-03 MED ORDER — HEMOSTATIC AGENTS (NO CHARGE) OPTIME
TOPICAL | Status: DC | PRN
  Administered 2020-10-03: 1 via TOPICAL

## 2020-10-03 MED ORDER — PROPOFOL 10 MG/ML IV BOLUS
INTRAVENOUS | Status: DC | PRN
  Administered 2020-10-03: 150 mg via INTRAVENOUS

## 2020-10-03 MED ORDER — HYDROCODONE-ACETAMINOPHEN 10-325 MG PO TABS
2.0000 | ORAL_TABLET | ORAL | Status: DC | PRN
  Administered 2020-10-03: 1 via ORAL
  Filled 2020-10-03: qty 2

## 2020-10-03 MED ORDER — LIDOCAINE 2% (20 MG/ML) 5 ML SYRINGE
INTRAMUSCULAR | Status: AC
  Filled 2020-10-03: qty 5

## 2020-10-03 MED ORDER — FENTANYL CITRATE (PF) 250 MCG/5ML IJ SOLN
INTRAMUSCULAR | Status: AC
  Filled 2020-10-03: qty 5

## 2020-10-03 MED ORDER — ROCURONIUM BROMIDE 10 MG/ML (PF) SYRINGE
PREFILLED_SYRINGE | INTRAVENOUS | Status: AC
  Filled 2020-10-03: qty 10

## 2020-10-03 MED ORDER — THROMBIN 5000 UNITS EX SOLR
CUTANEOUS | Status: DC | PRN
  Administered 2020-10-03: 10000 [IU] via TOPICAL

## 2020-10-03 MED ORDER — CHLORHEXIDINE GLUCONATE 0.12 % MT SOLN
OROMUCOSAL | Status: AC
  Administered 2020-10-03: 15 mL via OROMUCOSAL
  Filled 2020-10-03: qty 15

## 2020-10-03 MED ORDER — CYCLOBENZAPRINE HCL 10 MG PO TABS: 10.0000 mg | ORAL_TABLET | Freq: Three times a day (TID) | ORAL | 0 refills | Status: DC | PRN

## 2020-10-03 MED ORDER — CEFAZOLIN SODIUM-DEXTROSE 2-4 GM/100ML-% IV SOLN
INTRAVENOUS | Status: AC
  Filled 2020-10-03: qty 100

## 2020-10-03 MED ORDER — CEFAZOLIN SODIUM-DEXTROSE 2-4 GM/100ML-% IV SOLN
2.0000 g | INTRAVENOUS | Status: AC
  Administered 2020-10-03: 2 g via INTRAVENOUS

## 2020-10-03 MED ORDER — HYDROCODONE-ACETAMINOPHEN 10-325 MG PO TABS
1.0000 | ORAL_TABLET | Freq: Four times a day (QID) | ORAL | 0 refills | Status: DC | PRN
Start: 2020-10-03 — End: 2024-08-16

## 2020-10-03 MED ORDER — PREGABALIN 75 MG PO CAPS
150.0000 mg | ORAL_CAPSULE | Freq: Two times a day (BID) | ORAL | Status: DC
  Administered 2020-10-03: 150 mg via ORAL
  Filled 2020-10-03: qty 2

## 2020-10-03 MED ORDER — MIDAZOLAM HCL 5 MG/5ML IJ SOLN
INTRAMUSCULAR | Status: DC | PRN
  Administered 2020-10-03: 2 mg via INTRAVENOUS

## 2020-10-03 MED ORDER — CHLORHEXIDINE GLUCONATE CLOTH 2 % EX PADS: 6.0000 | MEDICATED_PAD | Freq: Once | CUTANEOUS | Status: DC

## 2020-10-03 MED ORDER — DEXAMETHASONE SODIUM PHOSPHATE 10 MG/ML IJ SOLN
10.0000 mg | Freq: Once | INTRAMUSCULAR | Status: AC
  Administered 2020-10-03: 10 mg via INTRAVENOUS

## 2020-10-03 MED ORDER — CHLORHEXIDINE GLUCONATE 0.12 % MT SOLN: 15.0000 mL | Freq: Once | OROMUCOSAL | Status: AC

## 2020-10-03 MED ORDER — HYDROMORPHONE HCL 1 MG/ML IJ SOLN
0.2500 mg | INTRAMUSCULAR | Status: DC | PRN
  Administered 2020-10-03 (×4): 0.5 mg via INTRAVENOUS

## 2020-10-03 MED ORDER — MENTHOL 3 MG MT LOZG: 1.0000 | LOZENGE | OROMUCOSAL | Status: DC | PRN

## 2020-10-03 MED ORDER — SODIUM CHLORIDE 0.9% FLUSH: 3.0000 mL | INTRAVENOUS | Status: DC | PRN

## 2020-10-03 MED ORDER — FENTANYL CITRATE (PF) 100 MCG/2ML IJ SOLN
INTRAMUSCULAR | Status: DC | PRN
  Administered 2020-10-03: 50 ug via INTRAVENOUS
  Administered 2020-10-03: 100 ug via INTRAVENOUS
  Administered 2020-10-03 (×2): 50 ug via INTRAVENOUS

## 2020-10-03 MED ORDER — NITROGLYCERIN 0.4 MG SL SUBL: 0.4000 mg | SUBLINGUAL_TABLET | SUBLINGUAL | Status: DC | PRN

## 2020-10-03 MED ORDER — MEPERIDINE HCL 25 MG/ML IJ SOLN: 6.2500 mg | INTRAMUSCULAR | Status: DC | PRN

## 2020-10-03 MED ORDER — THROMBIN 5000 UNITS EX SOLR
CUTANEOUS | Status: AC
  Filled 2020-10-03: qty 15000

## 2020-10-03 MED ORDER — CEFAZOLIN SODIUM-DEXTROSE 1-4 GM/50ML-% IV SOLN: 1.0000 g | Freq: Three times a day (TID) | INTRAVENOUS | Status: DC

## 2020-10-03 MED ORDER — SUGAMMADEX SODIUM 200 MG/2ML IV SOLN
INTRAVENOUS | Status: DC | PRN
  Administered 2020-10-03: 200 mg via INTRAVENOUS

## 2020-10-03 MED ORDER — SODIUM CHLORIDE 0.9 % IV SOLN: 250.0000 mL | INTRAVENOUS | Status: DC

## 2020-10-03 MED ORDER — ACETAMINOPHEN 325 MG PO TABS: 650.0000 mg | ORAL_TABLET | ORAL | Status: DC | PRN

## 2020-10-03 MED ORDER — LACTATED RINGERS IV SOLN: INTRAVENOUS | Status: DC

## 2020-10-03 MED ORDER — MUPIROCIN 2 % EX OINT: 1.0000 "application " | TOPICAL_OINTMENT | Freq: Two times a day (BID) | CUTANEOUS | Status: DC

## 2020-10-03 MED ORDER — CYCLOBENZAPRINE HCL 10 MG PO TABS: 10.0000 mg | ORAL_TABLET | Freq: Three times a day (TID) | ORAL | Status: DC | PRN

## 2020-10-03 MED ORDER — ACETAMINOPHEN 650 MG RE SUPP: 650.0000 mg | RECTAL | Status: DC | PRN

## 2020-10-03 MED ORDER — ONDANSETRON HCL 4 MG/2ML IJ SOLN
INTRAMUSCULAR | Status: DC | PRN
  Administered 2020-10-03: 4 mg via INTRAVENOUS

## 2020-10-03 SURGICAL SUPPLY — 50 items
BAG DECANTER FOR FLEXI CONT (MISCELLANEOUS) ×3 IMPLANT
BAND RUBBER #18 3X1/16 STRL (MISCELLANEOUS) ×6 IMPLANT
BENZOIN TINCTURE PRP APPL 2/3 (GAUZE/BANDAGES/DRESSINGS) ×3 IMPLANT
BIT DRILL 13 (BIT) ×2 IMPLANT
BIT DRILL 13MM (BIT) ×1
BUR MATCHSTICK NEURO 3.0 LAGG (BURR) ×3 IMPLANT
CAGE PEEK 8X14X11 (Cage) ×3 IMPLANT
CANISTER SUCT 3000ML PPV (MISCELLANEOUS) ×3 IMPLANT
CARTRIDGE OIL MAESTRO DRILL (MISCELLANEOUS) ×1 IMPLANT
CLOSURE WOUND 1/2 X4 (GAUZE/BANDAGES/DRESSINGS) ×1
COVER WAND RF STERILE (DRAPES) ×3 IMPLANT
DIFFUSER DRILL AIR PNEUMATIC (MISCELLANEOUS) ×3 IMPLANT
DRAPE C-ARM 42X72 X-RAY (DRAPES) ×6 IMPLANT
DRAPE LAPAROTOMY 100X72 PEDS (DRAPES) ×3 IMPLANT
DRAPE MICROSCOPE LEICA (MISCELLANEOUS) ×3 IMPLANT
DURAPREP 6ML APPLICATOR 50/CS (WOUND CARE) ×3 IMPLANT
ELECT COATED BLADE 2.86 ST (ELECTRODE) ×3 IMPLANT
ELECT REM PT RETURN 9FT ADLT (ELECTROSURGICAL) ×3
ELECTRODE REM PT RTRN 9FT ADLT (ELECTROSURGICAL) ×1 IMPLANT
GAUZE 4X4 16PLY RFD (DISPOSABLE) IMPLANT
GAUZE SPONGE 4X4 12PLY STRL (GAUZE/BANDAGES/DRESSINGS) ×3 IMPLANT
GLOVE ECLIPSE 9.0 STRL (GLOVE) ×3 IMPLANT
GLOVE EXAM NITRILE XL STR (GLOVE) IMPLANT
GOWN STRL REUS W/ TWL LRG LVL3 (GOWN DISPOSABLE) IMPLANT
GOWN STRL REUS W/ TWL XL LVL3 (GOWN DISPOSABLE) IMPLANT
GOWN STRL REUS W/TWL 2XL LVL3 (GOWN DISPOSABLE) IMPLANT
GOWN STRL REUS W/TWL LRG LVL3 (GOWN DISPOSABLE)
GOWN STRL REUS W/TWL XL LVL3 (GOWN DISPOSABLE)
HALTER HD/CHIN CERV TRACTION D (MISCELLANEOUS) ×3 IMPLANT
HEMOSTAT POWDER KIT SURGIFOAM (HEMOSTASIS) IMPLANT
KIT BASIN OR (CUSTOM PROCEDURE TRAY) ×3 IMPLANT
KIT TURNOVER KIT B (KITS) ×3 IMPLANT
NEEDLE SPNL 20GX3.5 QUINCKE YW (NEEDLE) ×3 IMPLANT
NS IRRIG 1000ML POUR BTL (IV SOLUTION) ×3 IMPLANT
OIL CARTRIDGE MAESTRO DRILL (MISCELLANEOUS) ×3
PACK LAMINECTOMY NEURO (CUSTOM PROCEDURE TRAY) ×3 IMPLANT
PAD ARMBOARD 7.5X6 YLW CONV (MISCELLANEOUS) ×9 IMPLANT
PLATE ZEVO 1LVL 17MM (Plate) ×3 IMPLANT
SCREW 3.5 SELFTAP 13MM VARI (Screw) ×12 IMPLANT
SPONGE INTESTINAL PEANUT (DISPOSABLE) ×3 IMPLANT
SPONGE SURGIFOAM ABS GEL SZ50 (HEMOSTASIS) ×6 IMPLANT
STRIP CLOSURE SKIN 1/2X4 (GAUZE/BANDAGES/DRESSINGS) ×2 IMPLANT
SUT VIC AB 3-0 SH 8-18 (SUTURE) ×3 IMPLANT
SUT VIC AB 4-0 RB1 18 (SUTURE) ×3 IMPLANT
TAPE CLOTH 4X10 WHT NS (GAUZE/BANDAGES/DRESSINGS) ×3 IMPLANT
TAPE CLOTH SURG 4X10 WHT LF (GAUZE/BANDAGES/DRESSINGS) ×3 IMPLANT
TOWEL GREEN STERILE (TOWEL DISPOSABLE) ×3 IMPLANT
TOWEL GREEN STERILE FF (TOWEL DISPOSABLE) ×3 IMPLANT
TRAP SPECIMEN MUCUS 40CC (MISCELLANEOUS) ×3 IMPLANT
WATER STERILE IRR 1000ML POUR (IV SOLUTION) ×3 IMPLANT

## 2020-10-03 NOTE — Discharge Instructions (Signed)

## 2020-10-03 NOTE — Anesthesia Postprocedure Evaluation (Signed)
Anesthesia Post Note  Patient: Donald Roberts  Procedure(s) Performed: Anterior Cervial Discectomy and Fusion Cervical three and cervical four (N/A )     Patient location during evaluation: PACU Anesthesia Type: General Level of consciousness: awake and alert Pain management: pain level controlled Vital Signs Assessment: post-procedure vital signs reviewed and stable Respiratory status: spontaneous breathing, nonlabored ventilation, respiratory function stable and patient connected to nasal cannula oxygen Cardiovascular status: blood pressure returned to baseline and stable Postop Assessment: no apparent nausea or vomiting Anesthetic complications: no   No complications documented.  Last Vitals:  Vitals:   10/03/20 1225 10/03/20 1544  BP: (!) 163/75 (!) 145/70  Pulse: (!) 55 (!) 55  Resp: 18 18  Temp: 36.8 C (!) 36.4 C  SpO2: 100% 99%    Last Pain:  Vitals:   10/03/20 1544  TempSrc: Oral  PainSc:                  Vonn Sliger DAVID

## 2020-10-03 NOTE — H&P (Signed)
Donald Roberts is an 69 y.o. male.   Chief Complaint: Weakness HPI: 69 year old male with neck pain and progressive bilateral upper and lower extremity weakness and sensory loss.  Patient status post extensive prior anterior cervical surgery by another physician including prior C2-3 anterior cervical discectomy and fusion and prior C4-T1 anterior cervical decompression and fusion.  Patient with severe disc degeneration and a broad-based disc herniation with critical spinal stenosis at C3-4 with marked cord compression and early signal abnormality.  Patient with chronic cord signal abnormality at C5-6 but no residual stenosis at this level.  Patient with increasing weakness and gait ataxia.  He is sniffily worsened over the past few weeks.  He presents now for decompression and fusion in hopes of improving his symptoms.  Past Medical History:  Diagnosis Date  . Abdominal pain, generalized 07/18/2008  . Abnormal gait   . ALLERGIC RHINITIS 07/18/2008  . Allergy   . Back pain, chronic 10/01/2011  . Carpal tunnel syndrome on left   . Chronic low back pain 12/25/2012  . COLONIC POLYPS, HX OF 07/18/2008  . CORONARY ARTERY DISEASE 07/18/2008  . Degenerative arthritis of right knee 10/01/2011  . DIVERTICULOSIS, COLON 07/18/2008  . Gross hematuria 07/18/2008  . Headache(784.0)    Hx: of Migraines as a child  . Heart murmur   . HYPERLIPIDEMIA 07/18/2008  . Hypertension   . Idiopathic progressive neuropathy   . PVD (peripheral vascular disease) (HCC)   . TOBACCO ABUSE 04/06/2009    Past Surgical History:  Procedure Laterality Date  . ANTERIOR CERVICAL DECOMP/DISCECTOMY FUSION N/A 09/10/2013   Procedure: ANTERIOR CERVICAL DECOMPRESSION/DISCECTOMY FUSION CERVICAL FOUR-FIVE,CERVICAL FIVE-SIX WITH PLATING AND BONE GRAFT;  Surgeon: Karn Cassis, MD;  [facility];  Service: Neurosurgery;  Laterality: N/A;  . ANTERIOR CERVICAL DECOMP/DISCECTOMY FUSION N/A 03/14/2015   Procedure: ANTERIOR CERVICAL  DECOMPRESSION/DISCECTOMY FUSION  CERVICAL TWO-THREE;  Surgeon: Hilda Lias, MD;  [facility];  Service: Neurosurgery;  Laterality: N/A;  . CARDIAC CATHETERIZATION    . COLONOSCOPY    . COLONOSCOPY W/ BIOPSIES AND POLYPECTOMY     Hx: of  . LEFT HEART CATH AND CORONARY ANGIOGRAPHY N/A 11/04/2018   Procedure: LEFT HEART CATH AND CORONARY ANGIOGRAPHY;  Surgeon: Orpah Cobb, MD;  [facility];  Service: Cardiovascular;  Laterality: N/A;  . lumbar surgury     dr botero/ns; 3 level fusion per pt  . POLYPECTOMY    . s/p cervical disease x 2      Family History  Problem Relation Age of Onset  . Heart attack Father   . Heart disease Brother   . Heart disease Mother   . Heart disease Other   . Parkinsonism Paternal Uncle   . Colon cancer Neg Hx   . Colon polyps Neg Hx   . Esophageal cancer Neg Hx   . Rectal cancer Neg Hx   . Stomach cancer Neg Hx    Social History:  reports that he has been smoking cigarettes. He has a 20.00 pack-year smoking history. He has never used smokeless tobacco. He reports current alcohol use. He reports that he does not use drugs.  Allergies: No Known Allergies  Medications Prior to Admission  Medication Sig Dispense Refill  . aspirin EC 81 MG EC tablet Take 1 tablet (81 mg total) by mouth daily.    Marland Kitchen HYDROcodone-acetaminophen (NORCO) 10-325 MG tablet Take 1 tablet by mouth every 8 (eight) hours as needed for moderate pain.     Marland Kitchen  pregabalin (LYRICA) 150 MG capsule Take 150 mg daily at night. (Patient taking differently: Take 150 mg by mouth 2 (two) times daily. ) 90 capsule 0  . ramipril (ALTACE) 5 MG capsule Take 1 capsule (5 mg total) by mouth daily. 90 capsule 3  . rosuvastatin (CRESTOR) 40 MG tablet Take 1 tablet (40 mg total) by mouth daily. 90 tablet 3  . DULoxetine (CYMBALTA) 60 MG capsule Take 1 capsule (60 mg total) by mouth daily. (Patient not taking: Reported on 09/29/2020) 90 capsule 3  . nitroGLYCERIN (NITROSTAT) 0.4 MG  SL tablet Place 1 tablet (0.4 mg total) under the tongue every 5 (five) minutes as needed for chest pain. 25 tablet 12    Results for orders placed or performed during the hospital encounter of 10/03/20 (from the past 48 hour(s))  CBC WITH DIFFERENTIAL     Status: Abnormal   Collection Time: 10/03/20  5:56 AM  Result Value Ref Range   WBC 9.2 4.0 - 10.5 K/uL   RBC 4.29 4.22 - 5.81 MIL/uL   Hemoglobin 12.8 (L) 13.0 - 17.0 g/dL   HCT 21.1 39 - 52 %   MCV 91.6 80.0 - 100.0 fL   MCH 29.8 26.0 - 34.0 pg   MCHC 32.6 30.0 - 36.0 g/dL   RDW 17.3 56.7 - 01.4 %   Platelets 282 150 - 400 K/uL   nRBC 0.0 0.0 - 0.2 %   Neutrophils Relative % 69 %   Neutro Abs 6.3 1.7 - 7.7 K/uL   Lymphocytes Relative 19 %   Lymphs Abs 1.8 0.7 - 4.0 K/uL   Monocytes Relative 10 %   Monocytes Absolute 0.9 0.1 - 1.0 K/uL   Eosinophils Relative 2 %   Eosinophils Absolute 0.2 0.0 - 0.5 K/uL   Basophils Relative 0 %   Basophils Absolute 0.0 0.0 - 0.1 K/uL   Immature Granulocytes 0 %   Abs Immature Granulocytes 0.04 0.00 - 0.07 K/uL    Comment: Performed at [facility], [address], [place], [place] [address]   No results found.  Pertinent items noted in HPI and remainder of comprehensive ROS otherwise negative.  Blood pressure (!) 148/76, pulse 60, temperature 98.5 F (36.9 C), temperature source Oral, resp. rate 17, height 5\' 10"  (1.778 m), weight 70.3 kg, SpO2 100 %.  Patient is awake and alert.  He is oriented and appropriate.  Speech is fluent.  Judgment insight are intact.  Cranial nerve function normal bilateral.  Motor examination reveals weakness of his grips and intrinsic functions bilaterally grading at 4-/5.  Lower extremity tone is increased.  Gait is spastic and unstable.  Examination head ears eyes nose throat is unremarked.  Chest and abdomen are benign.  Extremities are free from injury deformity.  Reflexes are increased throughout.  Hoffmann's responses are present in both hands.  Clonus  in both lower extremities. 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  Plan C3-4 anterior cervical decompression and fusion with interbody cage, local harvested autograft, and anterior plate instrumentation.  Risks and benefits been explained.  Patient wishes to proceed.  A Neill Jurewicz 10/03/2020, 7:57 AM

## 2020-10-03 NOTE — Anesthesia Procedure Notes (Signed)
Procedure Name: Intubation Date/Time: 10/03/2020 8:10 AM Performed by: Montez Morita, Logen Fowle W, CRNA Pre-anesthesia Checklist: Patient identified, Emergency Drugs available, Suction available and Patient being monitored Patient Re-evaluated:Patient Re-evaluated prior to induction Oxygen Delivery Method: Circle system utilized Preoxygenation: Pre-oxygenation with 100% oxygen Induction Type: IV induction Ventilation: Mask ventilation without difficulty Laryngoscope Size: Miller and 2 Grade View: Grade I Tube type: Oral Tube size: 7.5 mm Number of attempts: 1 Airway Equipment and Method: Stylet and Oral airway Placement Confirmation: ETT inserted through vocal cords under direct vision,  positive ETCO2 and breath sounds checked- equal and bilateral Secured at: 24 cm Tube secured with: Tape Dental Injury: Teeth and Oropharynx as per pre-operative assessment

## 2020-10-03 NOTE — Progress Notes (Signed)
Orthopedic Tech Progress Note Patient Details:  Donald Roberts Feb 19, 1951 [id number]  Ortho Devices Type of Ortho Device: Soft collar Ortho Device/Splint Interventions: Ordered       Bella Kennedy A Sriman Tally 10/03/2020, 10:43 AM

## 2020-10-03 NOTE — Transfer of Care (Signed)
Immediate Anesthesia Transfer of Care Note  Patient: Donald Roberts  Procedure(s) Performed: Anterior Cervial Discectomy and Fusion Cervical three and cervical four (N/A )  Patient Location: PACU  Anesthesia Type:General  Level of Consciousness: awake  Airway & Oxygen Therapy: Patient Spontanous Breathing and Patient connected to face mask oxygen  Post-op Assessment: Report given to RN and Post -op Vital signs reviewed and stable  Post vital signs: Reviewed and stable  Last Vitals:  Vitals Value Taken Time  BP 186/90 10/03/20 0946  Temp    Pulse 55 10/03/20 0948  Resp 12 10/03/20 0948  SpO2 100 % 10/03/20 0948  Vitals shown include unvalidated device data.  Last Pain:  Vitals:   10/03/20 0652  TempSrc:   PainSc: 4       Patients Stated Pain Goal: 3 (10/03/20 9485)  Complications: No complications documented.

## 2020-10-03 NOTE — Op Note (Signed)
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  Asst. Surgeon: Doran Durand, NP  Anesthesia: General  Indication: 69 year old male status post numerous prior anterior cervical surgeries by another physician.  Patient status post prior C2-3 and C4-T1 anterior cervical fusions.  Patient with worsening neck and bilateral upper extremity pain paresthesias and weakness.  Work-up demonstrates evidence of marked breakdown at the floating segment at C3-4 with broad-based central disc herniation and some moderate posterior ligamentous buckling causing severe spinal cord compression with spinal cord signal abnormality and evidence of progressive myelopathy on exam.  Patient presents now for urgent anterior cervical decompression and fusion in hopes of improving his symptoms.  Operative note: After induction of anesthesia, patient positioned supine with neck side extended held placed halter traction.  Patient's anterior cervical region prepped and draped sterilely.  Incision made overlying C3-4.  Dissection performed on the left side.  Retractor placed.  Fluoroscopy used.  Levels confirmed.  The space then incised.  Discectomy performed using various instruments down to level the posterior annulus.  Microscope was then brought to the field used throughout the remainder of the discectomy.  Remaining aspects annulus and osteophytes removed using high-speed drill down to level the posterior logical ligament.  Posterior ligament was elevated and resected.  Underlying thecal sac was identified.  A wide central decompression was then performed by undercutting the bodies of C3 and C4.  Decompression then proceeded  in the usual neural foramina.  Wide anterior foraminotomies were performed along the exiting C4 nerve roots bilaterally.  This point a very thorough decompression been achieved.  All evidence of disc herniation appeared resected.  There was no evidence of any residual compression of the thecal sac or nerve roots.  Wound was then irrigated with antibiotic solution.  Gelfoam was placed topically for hemostasis.  An 8 mm Medtronic anatomic peek cage was then packed with locally harvested autograft.  Gelfoam was removed.  Cage and bone graft was then packed into place and recessed slightly from the anterior cortical margin.  A 0 plate from Med[employer]onic was then placed over the C3-4 level between the prior instrumentation at C2-3 and C4 distally.  Pilot holes were drilled and 13 mm variable angle screws were secured into the bodies of C3 and C4.  Locking mechanism was engaged.  Final images reveal good position of the cage and the hardware at the proper level with normal alignment of the spine.  Wound was then irrigated one final time.  Hemostasis was assured with bipolar trocar.  Wound then closed in layers with Vicryl sutures.  Steri-Strips and sterile dressing were applied.  No apparent complications.  Patient tolerated the procedure well and he returned to the recovery room postop.

## 2020-10-03 NOTE — Progress Notes (Signed)
Occupational Therapy Treatment Patient Details Name: Donald Roberts MRN: [id number] DOB: [birth year] Today's Date: 10/03/2020    History of present illness 69 yo male s/p ACDF C3-4 PMH back pain, diverticulosis, mirgraines HTN idiopathic progressive neuropathy, PVD tobacco abuse ACDF 2014 2016 Lumbar surgery    OT comments  Patient is s/p C3-4 ACDF surgery resulting in functional limitations due to the deficits listed below (see OT problem list). Pt currently requires supervision for shower transfer with wife present but able to complete all dressing with figure 4 cross.  Patient will benefit from skilled OT acutely to increase independence and safety with ADLS to allow discharge home.   Follow Up Recommendations  No OT follow up    Equipment Recommendations  None recommended by OT    Recommendations for Other Services      Precautions / Restrictions Precautions Precautions: Cervical Precaution Comments: handout provided and reviewed in detail for adls       Mobility Bed Mobility Overal bed mobility: Modified Independent             General bed mobility comments: elevated bed surface with increased time and cues to avoid pulling with arms  Transfers Overall transfer level: Modified independent                    Balance                                           ADL either performed or assessed with clinical judgement   ADL Overall ADL's : Modified independent                                       General ADL Comments: educated ccollar and don doff ccollar, wife present for all education Cervical precautions ( handout provided): Educated patient on don doff brace with return demonstration, educated on oral care using cups, washing face with cloth, never to wash directly on incision site, avoid neck rotation flexion and extension, positioning with pillows in chair for bil UE, sleeping positioning, avoiding pushing / pulling with  bil UE, and fine motor exercises ( handout provided). Instructed on wall slides FF 90 degrees and table top slides.  Pt educated on need to notify doctor / RN of swallowing changes or choking..      Vision Baseline Vision/History: Wears glasses Wears Glasses: At all times Vision Assessment?: No apparent visual deficits   Perception     Praxis      Cognition Arousal/Alertness: Awake/alert Behavior During Therapy: WFL for tasks assessed/performed Overall Cognitive Status: Within Functional Limits for tasks assessed                                          Exercises     Shoulder Instructions       General Comments dressing dry and intact    Pertinent Vitals/ Pain       Pain Assessment: Faces Faces Pain Scale: Hurts a little bit Pain Location: neck Pain Descriptors / Indicators: Discomfort;Grimacing;Guarding Pain Intervention(s): Monitored during session;Premedicated before session;Repositioned  Home Living Family/patient expects to be discharged to:: Private residence Living Arrangements: Spouse/significant other Available Help at Discharge: Family Type of Home:  House Home Access: Level entry     Home Layout: One level     Bathroom Shower/Tub: Producer, television/film/video: Handicapped height     Home Equipment: Shower seat - built in;Grab bars - toilet   Additional Comments: has a dog named princess ( yorkie) plans to take a Office manager to [employer] for the entire winter after approved by Careers adviser      Prior Functioning/Environment Level of Independence: Independent        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   Frequency  Min 2X/week        Progress Toward Goals  OT Goals(current goals can now be found in the care plan section)     Acute Rehab OT Goals Patient Stated Goal: to go to his beach house OT Goal Formulation: With patient/family Time For Goal Achievement: 10/17/20 Potential to Achieve Goals:  Good  Plan      Co-evaluation                 AM-PAC OT "6 Clicks" Daily Activity     Outcome Measure   Help from another person eating meals?: None Help from another person taking care of personal grooming?: None Help from another person toileting, which includes using toliet, bedpan, or urinal?: None Help from another person bathing (including washing, rinsing, drying)?: None Help from another person to put on and taking off regular upper body clothing?: None Help from another person to put on and taking off regular lower body clothing?: None 6 Click Score: 24    End of Session Equipment Utilized During Treatment: Cervical collar  OT Visit Diagnosis: Unsteadiness on feet (R26.81);Muscle weakness (generalized) (M62.81);Pain   Activity Tolerance Patient tolerated treatment well   Patient Left in bed;with call bell/phone within reach;with family/visitor present   Nurse Communication Mobility status;Precautions        Time: 4665-9935 OT Time Calculation (min): 39 min  Charges: OT General Charges $OT Visit: 1 Visit OT Evaluation $OT Eval Moderate Complexity: 1 Mod OT Treatments $Self Care/Home Management : 8-22 mins   Brynn, OTR/L  Acute Rehabilitation Services Pager: [phone] Office: [phone] .    Mateo Flow 10/03/2020, 4:46 PM

## 2020-10-03 NOTE — Progress Notes (Signed)
Pt doing well. Pt and wife given D/C instructions with verbal understanding. Rx's were sent to the pharmacy by MD. Pt's incision is clean and dry with no sign of infection. Pt's IV was removed prior to D/C. Pt D/C'd home via wheelchair per MD order. Pt is stable @ D/C and has no other needs at this time. Caydance Kuehnle, RN  

## 2020-10-03 NOTE — Discharge Summary (Signed)
Physician Discharge Summary  Patient ID: Donald Roberts MRN: [id number] DOB/AGE: 08-30-1951 69 y.o.  Admit date: 10/03/2020 Discharge date: 10/03/2020  Admission Diagnoses:  Discharge Diagnoses:  Active Problems:   Cervical myelopathy [facility])   Discharged Condition: good  Hospital Course: Patient admitted to the hospital where he underwent uncomplicated C3-4 anterior cervical decompression and fusion.  Postop Donald Hai doing well.  Preoperative neck pain numbness and weakness very much improved.  Standing ambulating and voiding without difficulty.  Ready for discharge home.  Consults:   Significant Diagnostic Studies:   Treatments:   Discharge Exam: Blood pressure (!) 145/70, pulse (!) 55, temperature (!) 97.5 F (36.4 C), temperature source Oral, resp. rate 18, height 5\' 10"  (1.778 m), weight 70.3 kg, SpO2 99 %. Awake and alert.  Oriented and appropriate.  Motor and sensory function stable.  Wound clean and dry.  Chest and abdomen benign.  Disposition: Discharge disposition: 01-Home or Self Care        Allergies as of 10/03/2020   No Known Allergies     Medication List    TAKE these medications   aspirin 81 MG EC tablet Take 1 tablet (81 mg total) by mouth daily.   cyclobenzaprine 10 MG tablet Commonly known as: FLEXERIL Take 1 tablet (10 mg total) by mouth 3 (three) times daily as needed for muscle spasms.   DULoxetine 60 MG capsule Commonly known as: CYMBALTA Take 1 capsule (60 mg total) by mouth daily.   HYDROcodone-acetaminophen 10-325 MG tablet Commonly known as: NORCO Take 1 tablet by mouth every 6 (six) hours as needed for moderate pain. What changed: when to take this   nitroGLYCERIN 0.4 MG SL tablet Commonly known as: NITROSTAT Place 1 tablet (0.4 mg total) under the tongue every 5 (five) minutes as needed for chest pain.   pregabalin 150 MG capsule Commonly known as: LYRICA Take 150 mg daily at night. What changed:   how much to take  how to take  this  when to take this  additional instructions   ramipril 5 MG capsule Commonly known as: ALTACE Take 1 capsule (5 mg total) by mouth daily.   rosuvastatin 40 MG tablet Commonly known as: CRESTOR Take 1 tablet (40 mg total) by mouth daily.        Signed: 13/01/2020 Donald Roberts 10/03/2020, 6:55 PM

## 2020-10-03 NOTE — Brief Op Note (Signed)
10/03/2020  9:40 AM  PATIENT:  Donald Roberts  69 y.o. male  PRE-OPERATIVE DIAGNOSIS:  Stenosis  POST-OPERATIVE DIAGNOSIS:  Stenosis  PROCEDURE:  Procedure(s) with comments: Anterior Cervial Discectomy and Fusion Cervical three and cervical four (N/A) - 3C  SURGEON:  Surgeon(s) and Role:    Julio Sicks, MD - Primary  PHYSICIAN ASSISTANT:   ASSISTANTSMarland Mcalpine   ANESTHESIA:   general  EBL:  25 mL   BLOOD ADMINISTERED:none  DRAINS: none   LOCAL MEDICATIONS USED:  NONE  SPECIMEN:  No Specimen  DISPOSITION OF SPECIMEN:  N/A  COUNTS:  YES  TOURNIQUET:  * No tourniquets in log *  DICTATION: .Dragon Dictation  PLAN OF CARE: Admit for overnight observation  PATIENT DISPOSITION:  PACU - hemodynamically stable.   Delay start of Pharmacological VTE agent (>24hrs) due to surgical blood loss or risk of bleeding: yes

## 2020-10-04 ENCOUNTER — Encounter (HOSPITAL_COMMUNITY): Payer: Self-pay | Admitting: Neurosurgery

## 2020-10-09 NOTE — Telephone Encounter (Signed)
Received fax from [facility] [employer] about pt prescription.

## 2020-12-12 ENCOUNTER — Other Ambulatory Visit: Payer: Self-pay | Admitting: Cardiology

## 2020-12-15 ENCOUNTER — Other Ambulatory Visit: Payer: BC Managed Care – PPO

## 2021-01-08 ENCOUNTER — Encounter: Payer: Self-pay | Admitting: *Deleted

## 2021-01-26 ENCOUNTER — Telehealth: Payer: Self-pay | Admitting: Cardiology

## 2021-01-26 NOTE — Telephone Encounter (Signed)
    Pt's wife returning call, she said to call her not the pt because he is out of [place]

## 2021-01-26 NOTE — Telephone Encounter (Signed)
New Message:    Wife called and said pt had received letter about coming in to get lab work. Pt [place] you to know [place] and [place] in the last part of April or the first of May to get his lab work.

## 2021-01-26 NOTE — Telephone Encounter (Signed)
Spoke with pt wife, aware okay for labs to be done when pt returns. She reports the patient is tired all the time by her report. Encouraged the patient to get checked out where he is at.

## 2021-01-26 NOTE — Telephone Encounter (Signed)
Attempted to call patient, left message for patient to call back to office.   

## 2021-03-29 DIAGNOSIS — M542 Cervicalgia: Secondary | ICD-10-CM | POA: Diagnosis not present

## 2021-03-29 DIAGNOSIS — Z981 Arthrodesis status: Secondary | ICD-10-CM | POA: Diagnosis not present

## 2021-05-01 DIAGNOSIS — R5383 Other fatigue: Secondary | ICD-10-CM | POA: Diagnosis not present

## 2021-05-01 DIAGNOSIS — Z79899 Other long term (current) drug therapy: Secondary | ICD-10-CM | POA: Diagnosis not present

## 2021-05-01 DIAGNOSIS — F17210 Nicotine dependence, cigarettes, uncomplicated: Secondary | ICD-10-CM | POA: Diagnosis not present

## 2021-05-01 DIAGNOSIS — M546 Pain in thoracic spine: Secondary | ICD-10-CM | POA: Diagnosis not present

## 2021-05-01 DIAGNOSIS — E559 Vitamin D deficiency, unspecified: Secondary | ICD-10-CM | POA: Diagnosis not present

## 2021-05-01 DIAGNOSIS — M542 Cervicalgia: Secondary | ICD-10-CM | POA: Diagnosis not present

## 2021-05-01 DIAGNOSIS — E7800 Pure hypercholesterolemia, unspecified: Secondary | ICD-10-CM | POA: Diagnosis not present

## 2021-05-01 DIAGNOSIS — M129 Arthropathy, unspecified: Secondary | ICD-10-CM | POA: Diagnosis not present

## 2021-05-01 DIAGNOSIS — Z20822 Contact with and (suspected) exposure to covid-19: Secondary | ICD-10-CM | POA: Diagnosis not present

## 2021-05-01 DIAGNOSIS — M545 Low back pain, unspecified: Secondary | ICD-10-CM | POA: Diagnosis not present

## 2021-05-01 DIAGNOSIS — F17200 Nicotine dependence, unspecified, uncomplicated: Secondary | ICD-10-CM | POA: Diagnosis not present

## 2021-05-15 DIAGNOSIS — Z79899 Other long term (current) drug therapy: Secondary | ICD-10-CM | POA: Diagnosis not present

## 2021-05-31 DIAGNOSIS — Z79899 Other long term (current) drug therapy: Secondary | ICD-10-CM | POA: Diagnosis not present

## 2021-05-31 DIAGNOSIS — F17200 Nicotine dependence, unspecified, uncomplicated: Secondary | ICD-10-CM | POA: Diagnosis not present

## 2021-05-31 DIAGNOSIS — M545 Low back pain, unspecified: Secondary | ICD-10-CM | POA: Diagnosis not present

## 2021-05-31 DIAGNOSIS — F17210 Nicotine dependence, cigarettes, uncomplicated: Secondary | ICD-10-CM | POA: Diagnosis not present

## 2021-07-02 DIAGNOSIS — M545 Low back pain, unspecified: Secondary | ICD-10-CM | POA: Diagnosis not present

## 2021-07-02 DIAGNOSIS — F17210 Nicotine dependence, cigarettes, uncomplicated: Secondary | ICD-10-CM | POA: Diagnosis not present

## 2021-07-02 DIAGNOSIS — Z79899 Other long term (current) drug therapy: Secondary | ICD-10-CM | POA: Diagnosis not present

## 2021-07-02 DIAGNOSIS — F17200 Nicotine dependence, unspecified, uncomplicated: Secondary | ICD-10-CM | POA: Diagnosis not present

## 2021-08-02 DIAGNOSIS — F17210 Nicotine dependence, cigarettes, uncomplicated: Secondary | ICD-10-CM | POA: Diagnosis not present

## 2021-08-02 DIAGNOSIS — M545 Low back pain, unspecified: Secondary | ICD-10-CM | POA: Diagnosis not present

## 2021-08-02 DIAGNOSIS — Z79899 Other long term (current) drug therapy: Secondary | ICD-10-CM | POA: Diagnosis not present

## 2021-08-02 DIAGNOSIS — G8929 Other chronic pain: Secondary | ICD-10-CM | POA: Diagnosis not present

## 2021-08-31 DIAGNOSIS — Z79899 Other long term (current) drug therapy: Secondary | ICD-10-CM | POA: Diagnosis not present

## 2021-08-31 DIAGNOSIS — M545 Low back pain, unspecified: Secondary | ICD-10-CM | POA: Diagnosis not present

## 2021-08-31 DIAGNOSIS — F17210 Nicotine dependence, cigarettes, uncomplicated: Secondary | ICD-10-CM | POA: Diagnosis not present

## 2021-08-31 DIAGNOSIS — G8929 Other chronic pain: Secondary | ICD-10-CM | POA: Diagnosis not present

## 2021-12-03 NOTE — Progress Notes (Signed)
HPI: FU CAD.  Previously followed by Dr. Doylene Canard. Cardiac catheterization December 2019 showed 25% proximal LAD. Abdominal ultrasound February 2021 showed greater than 50% stenosis in the left common iliac and external iliac arteries but no abdominal aortic aneurysm.  Chest CT September 2021 showed coronary artery calcifications and 4 cm ascending thoracic aortic aneurysm.  Since last seen patient has dyspnea with more vigorous activities but not routine activities.  No orthopnea, PND, pedal edema or claudication.  He occasionally has a chest pain at rest that resolves with nitroglycerin.  He has this for approximately 8 years.  He does not have exertional chest pain.  No syncope.  Current Outpatient Medications  Medication Sig Dispense Refill   aspirin EC 81 MG EC tablet Take 1 tablet (81 mg total) by mouth daily.     cyclobenzaprine (FLEXERIL) 10 MG tablet Take 1 tablet (10 mg total) by mouth 3 (three) times daily as needed for muscle spasms. (Patient not taking: Reported on 12/13/2021) 30 tablet 0   DULoxetine (CYMBALTA) 60 MG capsule Take 1 capsule (60 mg total) by mouth daily. (Patient not taking: Reported on 09/29/2020) 90 capsule 3   HYDROcodone-acetaminophen (NORCO) 10-325 MG tablet Take 1 tablet by mouth every 6 (six) hours as needed for moderate pain. (Patient not taking: Reported on 12/13/2021) 40 tablet 0   nitroGLYCERIN (NITROSTAT) 0.4 MG SL tablet DISSOLVE 1 TABLET UNDER TONGUE AT ONSET OF CHEST PAIN. MAY REPEAT DOSE EVERY 5 MINUTES UP TO 3 DOSES AS NEEDED. IF PAIN NOT RELIEVED, CALL 911. (Patient not taking: Reported on 12/13/2021) 25 tablet 1   NONFORMULARY OR COMPOUNDED ITEM [facility] ( meloxicam 0.5%, gabapentin 6%, lidocaine 2%, prilocaine 2%) (Patient not taking: Reported on 12/13/2021)     pregabalin (LYRICA) 150 MG capsule Take 150 mg daily at night. (Patient not taking: Reported on 12/13/2021) 90 capsule 0   ramipril (ALTACE) 5 MG capsule Take 1 capsule (5 mg  total) by mouth daily. (Patient not taking: Reported on 12/13/2021) 90 capsule 3   rosuvastatin (CRESTOR) 40 MG tablet Take 1 tablet (40 mg total) by mouth daily. (Patient not taking: Reported on 12/13/2021) 90 tablet 3   No current facility-administered medications for this visit.     Past Medical History:  Diagnosis Date   Abdominal pain, generalized 07/18/2008   Abnormal gait    ALLERGIC RHINITIS 07/18/2008   Allergy    Back pain, chronic 10/01/2011   Carpal tunnel syndrome on left    Chronic low back pain 12/25/2012   COLONIC POLYPS, HX OF 07/18/2008   CORONARY ARTERY DISEASE 07/18/2008   Degenerative arthritis of right knee 10/01/2011   DIVERTICULOSIS, COLON 07/18/2008   Gross hematuria 07/18/2008   Headache(784.0)    Hx: of Migraines as a child   Heart murmur    HYPERLIPIDEMIA 07/18/2008   Hypertension    Idiopathic progressive neuropathy    PVD (peripheral vascular disease) ([place])    TOBACCO ABUSE 04/06/2009    Past Surgical History:  Procedure Laterality Date   ANTERIOR CERVICAL DECOMP/DISCECTOMY FUSION N/A 09/10/2013   Procedure: ANTERIOR CERVICAL DECOMPRESSION/DISCECTOMY FUSION CERVICAL FOUR-FIVE,CERVICAL FIVE-SIX WITH PLATING AND BONE GRAFT;  Surgeon: Floyce Stakes, MD;  Location: [facility];  Service: Neurosurgery;  Laterality: N/A;   ANTERIOR CERVICAL DECOMP/DISCECTOMY FUSION N/A 03/14/2015   Procedure: ANTERIOR CERVICAL DECOMPRESSION/DISCECTOMY FUSION  CERVICAL TWO-THREE;  Surgeon: Leeroy Cha, MD;  Location: [place] [facility];  Service: Neurosurgery;  Laterality: N/A;   ANTERIOR CERVICAL DECOMP/DISCECTOMY FUSION N/A  10/03/2020   Procedure: Anterior Cervial Discectomy and Fusion Cervical three and cervical four;  Surgeon: Earnie Larsson, MD;  Location: [place];  Service: Neurosurgery;  Laterality: N/A;  3C   CARDIAC CATHETERIZATION     COLONOSCOPY     COLONOSCOPY W/ BIOPSIES AND POLYPECTOMY     Hx: of   LEFT HEART CATH AND CORONARY ANGIOGRAPHY N/A 11/04/2018   Procedure: LEFT  HEART CATH AND CORONARY ANGIOGRAPHY;  Surgeon: Dixie Dials, MD;  Location: [place] [facility];  Service: Cardiovascular;  Laterality: N/A;   lumbar surgury     dr botero/ns; 3 level fusion per pt   POLYPECTOMY     s/p cervical disease x 2      Social History   Socioeconomic History   Marital status: Married    Spouse name: Hassan Rowan   Number of children: 1   Years of education: Not on file   Highest education level: High school graduate  Occupational History   Occupation: Chief Strategy Officer and Visual merchandiser    Comment: semi retired  Tobacco Use   Smoking status: Every Day    Packs/day: 0.50    Years: 40.00    Pack years: 20.00    Types: Cigarettes   Smokeless tobacco: Never  Substance and Sexual Activity   Alcohol use: Yes    Comment: occasional   Drug use: No   Sexual activity: Not Currently  Other Topics Concern   Not on file  Social History Narrative   Married, 1 daughter lives close by.   Spends time at [employer] in [place].   Social Determinants of Health   Financial Resource Strain: Not on file  Food Insecurity: Not on file  Transportation Needs: Not on file  Physical Activity: Not on file  Stress: Not on file  Social Connections: Not on file  Intimate Partner Violence: Not on file    Family History  Problem Relation Age of Onset   Heart attack Father    Heart disease Brother    Heart disease Mother    Heart disease Other    Parkinsonism Paternal Uncle    Colon cancer Neg Hx    Colon polyps Neg Hx    Esophageal cancer Neg Hx    Rectal cancer Neg Hx    Stomach cancer Neg Hx     ROS: no fevers or chills, productive cough, hemoptysis, dysphasia, odynophagia, melena, hematochezia, dysuria, hematuria, rash, seizure activity, orthopnea, PND, pedal edema, claudication. Remaining systems are negative.  Physical Exam: Well-developed well-nourished in no acute distress.  Skin is warm and dry.  HEENT is normal.  Neck is supple.  Chest is clear to auscultation  with normal expansion.  Cardiovascular exam is regular rate and rhythm.  Abdominal exam nontender or distended. No masses palpated. Extremities show no edema. neuro grossly intact  ECG-sinus bradycardia at a rate of 57, cannot rule out septal infarct.  Personally reviewed  A/P  1 coronary artery disease-patient describes occasional chest pain that is atypical.  He takes a nitroglycerin and it resolves.  It is longstanding and unchanged.  We will not pursue further ischemia evaluation.  Plan to continue aspirin and statin.  2 hypertension-blood pressure controlled.  Continue present medications.  Check potassium and renal function.  3 hyperlipidemia-continue statin.  Check lipids and liver.  4 history of tobacco abuse-patient again counseled on discontinuing.  5 peripheral vascular disease-he denies claudication at this point.  Continue aspirin and statin.  6 history of dilated ascending aorta-we will arrange follow-up  CTA.  Kirk Ruths, MD

## 2021-12-13 ENCOUNTER — Other Ambulatory Visit: Payer: Self-pay

## 2021-12-13 ENCOUNTER — Ambulatory Visit (INDEPENDENT_AMBULATORY_CARE_PROVIDER_SITE_OTHER): Payer: Medicare HMO | Admitting: Cardiology

## 2021-12-13 ENCOUNTER — Encounter: Payer: Self-pay | Admitting: Cardiology

## 2021-12-13 ENCOUNTER — Other Ambulatory Visit: Payer: Self-pay | Admitting: *Deleted

## 2021-12-13 VITALS — BP 132/72 | HR 57 | Ht 70.0 in | Wt 153.8 lb

## 2021-12-13 DIAGNOSIS — I10 Essential (primary) hypertension: Secondary | ICD-10-CM | POA: Diagnosis not present

## 2021-12-13 DIAGNOSIS — Z720 Tobacco use: Secondary | ICD-10-CM

## 2021-12-13 DIAGNOSIS — E7800 Pure hypercholesterolemia, unspecified: Secondary | ICD-10-CM | POA: Diagnosis not present

## 2021-12-13 DIAGNOSIS — I2510 Atherosclerotic heart disease of native coronary artery without angina pectoris: Secondary | ICD-10-CM | POA: Diagnosis not present

## 2021-12-13 DIAGNOSIS — I739 Peripheral vascular disease, unspecified: Secondary | ICD-10-CM

## 2021-12-13 DIAGNOSIS — I712 Thoracic aortic aneurysm, without rupture, unspecified: Secondary | ICD-10-CM

## 2021-12-13 LAB — COMPREHENSIVE METABOLIC PANEL
ALT: 8 IU/L (ref 0–44)
AST: 19 IU/L (ref 0–40)
Albumin/Globulin Ratio: 1.4 (ref 1.2–2.2)
Albumin: 4 g/dL (ref 3.8–4.8)
Alkaline Phosphatase: 56 IU/L (ref 44–121)
BUN/Creatinine Ratio: 12 (ref 10–24)
BUN: 10 mg/dL (ref 8–27)
Bilirubin Total: 0.3 mg/dL (ref 0.0–1.2)
CO2: 25 mmol/L (ref 20–29)
Calcium: 9 mg/dL (ref 8.6–10.2)
Chloride: 102 mmol/L (ref 96–106)
Creatinine, Ser: 0.86 mg/dL (ref 0.76–1.27)
Globulin, Total: 2.9 g/dL (ref 1.5–4.5)
Glucose: 79 mg/dL (ref 70–99)
Potassium: 4.5 mmol/L (ref 3.5–5.2)
Sodium: 137 mmol/L (ref 134–144)
Total Protein: 6.9 g/dL (ref 6.0–8.5)
eGFR: 94 mL/min/{1.73_m2} (ref >59)

## 2021-12-13 LAB — LIPID PANEL
Chol/HDL Ratio: 4 ratio (ref 0.0–5.0)
Cholesterol, Total: 169 mg/dL (ref 100–199)
HDL: 42 mg/dL (ref >39)
LDL Chol Calc (NIH): 102 mg/dL — ABNORMAL HIGH (ref 0–99)
Triglycerides: 142 mg/dL (ref 0–149)
VLDL Cholesterol Cal: 25 mg/dL (ref 5–40)

## 2021-12-13 MED ORDER — NITROGLYCERIN 0.4 MG SL SUBL: SUBLINGUAL_TABLET | SUBLINGUAL | 3 refills | Status: DC

## 2021-12-13 MED ORDER — ROSUVASTATIN CALCIUM 40 MG PO TABS: 40.0000 mg | ORAL_TABLET | Freq: Every day | ORAL | 3 refills | Status: DC

## 2021-12-13 MED ORDER — RAMIPRIL 5 MG PO CAPS: 5.0000 mg | ORAL_CAPSULE | Freq: Every day | ORAL | 3 refills | Status: DC

## 2021-12-13 NOTE — Patient Instructions (Signed)

## 2021-12-14 ENCOUNTER — Other Ambulatory Visit: Payer: Self-pay

## 2021-12-14 DIAGNOSIS — E7800 Pure hypercholesterolemia, unspecified: Secondary | ICD-10-CM

## 2021-12-14 MED ORDER — EZETIMIBE 10 MG PO TABS: 10.0000 mg | ORAL_TABLET | Freq: Every day | ORAL | 3 refills | Status: DC

## 2021-12-21 ENCOUNTER — Telehealth: Payer: Self-pay | Admitting: Cardiology

## 2021-12-21 DIAGNOSIS — I712 Thoracic aortic aneurysm, without rupture, unspecified: Secondary | ICD-10-CM

## 2021-12-21 NOTE — Telephone Encounter (Signed)
Spoke with patient regarding a question he had about a possible stress test in April. Explained that he would have to speak with Dr. Ludwig Clarks nurse and that I would send her this message.

## 2021-12-21 NOTE — Telephone Encounter (Signed)
Patient's wife states during 01/12 appointment with Dr. Jens Som the patient discussed possibly having a stress test in April, but I do not see any orders. Please advise.

## 2021-12-25 NOTE — Telephone Encounter (Signed)
Spoke with pt wife, she is aware the patient needs a follow up CTA of the chest for thoracic aneurysm. She reports they will be out of town but wants to go ahead and get the order placed for [facility]. Aware order placed and someone will call to schedule.

## 2022-01-07 ENCOUNTER — Telehealth: Payer: Self-pay | Admitting: Cardiology

## 2022-01-07 NOTE — Telephone Encounter (Signed)
Spoke with pt wife, aware no appointment is needed. Will call them with the results once reviewed by dr Stanford Breed

## 2022-01-07 NOTE — Telephone Encounter (Signed)
New Message:      Patient wife is calling to find ot, does patient need a follow up appointment after his CT on 02-04-22?

## 2022-02-01 ENCOUNTER — Other Ambulatory Visit
Admission: RE | Admit: 2022-02-01 | Discharge: 2022-02-01 | Disposition: A | Discharge status: Home / Self Care | Payer: Medicare HMO | Attending: Cardiology | Admitting: Cardiology

## 2022-02-01 DIAGNOSIS — E7800 Pure hypercholesterolemia, unspecified: Secondary | ICD-10-CM | POA: Insufficient documentation

## 2022-02-01 LAB — LIPID PANEL
Cholesterol: 103 mg/dL (ref 0–200)
HDL: 49 mg/dL (ref >40)
LDL Cholesterol: 43 mg/dL (ref 0–99)
Total CHOL/HDL Ratio: 2.1 RATIO
Triglycerides: 54 mg/dL (ref <150)
VLDL: 11 mg/dL (ref 0–40)

## 2022-02-01 LAB — HEPATIC FUNCTION PANEL
ALT: 15 U/L (ref 0–44)
AST: 22 U/L (ref 15–41)
Albumin: 3.9 g/dL (ref 3.5–5.0)
Alkaline Phosphatase: 52 U/L (ref 38–126)
Bilirubin, Direct: 0.1 mg/dL (ref 0.0–0.2)
Indirect Bilirubin: 0.4 mg/dL (ref 0.3–0.9)
Total Bilirubin: 0.5 mg/dL (ref 0.3–1.2)
Total Protein: 7.6 g/dL (ref 6.5–8.1)

## 2022-02-04 ENCOUNTER — Telehealth: Payer: Self-pay | Admitting: Cardiology

## 2022-02-04 NOTE — Telephone Encounter (Signed)
Patient's wife is calling wanting to know if patient's upcoming CT will show any blockages he may have. Please advise.  ?

## 2022-02-04 NOTE — Telephone Encounter (Addendum)
Patient's wife asked if CTA angio chest aorta will show any heart blockages. I explained the exam will look at the thoracic aorta, not the heart. She reported that over the past 2 months. Patient had 3 episodes of resting chest pain. On two of those occassions, patient needed 3 ntg to get relief. One one occasion, he needed 2 ntg for relief. They do not monitor BP. Wife states she would like patient to have a cardiac cath. His last one was in 2019. She also stated patient is to fly out of the country on 4/4. Please advise on chest pain and cardiac cath. ?

## 2022-02-05 NOTE — Telephone Encounter (Addendum)
Called and spoke with patient, scheduled patient at next available with Bernadene Person NP for 3/16 at 2:15pm. Patient denies any symptoms at present and states his last episode of CP was 2 weeks ago.  ? ?Made patient aware of ED precautions should new or worsening symptoms develop. Patient verbalized understanding.  ? ?

## 2022-02-07 ENCOUNTER — Ambulatory Visit
Admission: RE | Admit: 2022-02-07 | Discharge: 2022-02-07 | Disposition: A | Discharge status: Home / Self Care | Payer: Medicare HMO | Source: Ambulatory Visit | Attending: Cardiology | Admitting: Cardiology

## 2022-02-07 ENCOUNTER — Other Ambulatory Visit: Payer: Self-pay

## 2022-02-07 DIAGNOSIS — I712 Thoracic aortic aneurysm, without rupture, unspecified: Secondary | ICD-10-CM | POA: Diagnosis present

## 2022-02-07 LAB — POCT I-STAT CREATININE: Creatinine, Ser: 0.9 mg/dL (ref 0.61–1.24)

## 2022-02-07 MED ORDER — IOHEXOL 350 MG/ML SOLN
75.0000 mL | Freq: Once | INTRAVENOUS | Status: AC | PRN
  Administered 2022-02-07: 13:00:00 75 mL via INTRAVENOUS

## 2022-02-14 ENCOUNTER — Encounter: Payer: Self-pay | Admitting: Nurse Practitioner

## 2022-02-14 ENCOUNTER — Other Ambulatory Visit: Payer: Self-pay

## 2022-02-14 ENCOUNTER — Ambulatory Visit: Payer: Medicare HMO | Admitting: Nurse Practitioner

## 2022-02-14 VITALS — BP 124/70 | HR 68 | Ht 70.0 in | Wt 151.6 lb

## 2022-02-14 DIAGNOSIS — R079 Chest pain, unspecified: Secondary | ICD-10-CM | POA: Diagnosis not present

## 2022-02-14 DIAGNOSIS — I77810 Thoracic aortic ectasia: Secondary | ICD-10-CM | POA: Diagnosis not present

## 2022-02-14 DIAGNOSIS — I2510 Atherosclerotic heart disease of native coronary artery without angina pectoris: Secondary | ICD-10-CM | POA: Diagnosis not present

## 2022-02-14 DIAGNOSIS — I739 Peripheral vascular disease, unspecified: Secondary | ICD-10-CM | POA: Diagnosis not present

## 2022-02-14 MED ORDER — METOPROLOL TARTRATE 100 MG PO TABS: ORAL_TABLET | ORAL | 0 refills | Status: DC

## 2022-02-14 NOTE — Patient Instructions (Addendum)
Medication Instructions:  ?Metoprolol 100 mg one tab 2 hours before scan  ?*If you need a refill on your cardiac medications before your next appointment, please call your pharmacy* ? ? ?Lab Work: ?Your physician recommends that you return for lab work 1 week before scan ?BMET ? ? ?If you have labs (blood work) drawn today and your tests are completely normal, you will receive your results only by: ?MyChart Message (if you have MyChart) OR ?A paper copy in the mail ?If you have any lab test that is abnormal or we need to change your treatment, we will call you to review the results. ? ? ?Testing/Procedures: ?Your physician has requested that you have cardiac CT. Cardiac computed tomography (CT) is a painless test that uses an x-ray machine to take clear, detailed pictures of your heart. For further information please visit [email or web address] Please follow instruction sheet as given. ?  ? ? ? ?Follow-Up: ?At [facility], you and your health needs are our priority.  As part of our continuing mission to provide you with exceptional heart care, we have created designated Provider Care Teams.  These Care Teams include your primary Cardiologist (physician) and Advanced Practice Providers (APPs -  Physician Assistants and Nurse Practitioners) who all work together to provide you with the care you need, when you need it. ? ?We recommend signing up for the patient portal called "MyChart".  Sign up information is provided on this After Visit Summary.  MyChart is used to connect with patients for Virtual Visits (Telemedicine).  Patients are able to view lab/test results, encounter notes, upcoming appointments, etc.  Non-urgent messages can be sent to your provider as well.   ?To learn more about what you can do with MyChart, go to [email or web address].   ? ?Your next appointment:   ?2 month(s) ? ?The format for your next appointment:   ?In Person ? ?Provider:   ?Olga Millers, MD  or Bernadene Person, NP       ? ?Other ? ?Your cardiac CT will be scheduled at one of the below locations:  ? ?[facility] ?[address] ?[place], [place] [address] ?[phone] ? ?OR ? ?[facility] ?[address] ?[address] ?[place], [place] [address] ?[phone] ? ?If scheduled at [facility], please arrive at the [facility] Entrance (Entrance C2) of [facility] 30 minutes prior to test start time. ?You can use the FREE valet parking offered at entrance C (encouraged to control the heart rate for the test)  ?Proceed to the [facility] Radiology Department (first floor) to check-in and test prep. ? ?All radiology patients and guests should use entrance C2 at [facility], accessed from [facility], even though the hospital's physical address listed is [address] ? ? ? ?If scheduled at [facility], please arrive 15 mins early for check-in and test prep. ? ?Please follow these instructions carefully (unless otherwise directed): ? ?Hold all erectile dysfunction medications at least 3 days (72 hrs) prior to test. ? ?On the Night Before the Test: ?Be sure to Drink plenty of water. ?Do not consume any caffeinated/decaffeinated beverages or chocolate 12 hours prior to your test. ?Do not take any antihistamines 12 hours prior to your test. ?If the patient has contrast allergy: ?Patient will need a prescription for Prednisone and very clear instructions (as follows): ?Prednisone 50 mg - take 13 hours prior to test ?Take another Prednisone 50 mg 7 hours  prior to test ?Take another Prednisone 50 mg 1 hour prior to test ?Take Benadryl 50 mg 1 hour prior to test ?Patient must complete all four doses of above prophylactic medications. ?Patient will need a ride after test due to Benadryl. ? ?On the Day of the Test: ?Drink plenty of water until 1 hour prior to the test. ?Do not eat any food 4 hours prior to the test. ?You  may take your regular medications prior to the test.  ?Take metoprolol (Lopressor) two hours prior to test. ?HOLD Furosemide/Hydrochlorothiazide morning of the test. ?FEMALES- please wear underwire-free bra if available, avoid dresses & tight clothing ? ? ?*For Clinical Staff only. Please instruct patient the following:* ?Heart Rate Medication Recommendations for Cardiac CT  ?Resting HR < 50 bpm  ?No medication  ?Resting HR 50-60 bpm and BP >110/50 mmHG   ?Consider Metoprolol tartrate 25 mg PO 90-120 min prior to scan  ?Resting HR 60-65 bpm and BP >110/50 mmHG  ?Metoprolol tartrate 50 mg PO 90-120 minutes prior to scan   ?Resting HR > 65 bpm and BP >110/50 mmHG  ?Metoprolol tartrate 100 mg PO 90-120 minutes prior to scan  ?Consider Ivabradine 10-15 mg PO or a calcium channel blocker for resting HR >60 bpm and contraindication to metoprolol tartrate  ?Consider Ivabradine 10-15 mg PO in combination with metoprolol tartrate for HR >80 bpm   ? ?     ?After the Test: ?Drink plenty of water. ?After receiving IV contrast, you may experience a mild flushed feeling. This is normal. ?On occasion, you may experience a mild rash up to 24 hours after the test. This is not dangerous. If this occurs, you can take Benadryl 25 mg and increase your fluid intake. ?If you experience trouble breathing, this can be serious. If it is severe call 911 IMMEDIATELY. If it is mild, please call our office. ?If you take any of these medications: Glipizide/Metformin, Avandament, Glucavance, please do not take 48 hours after completing test unless otherwise instructed. ? ?We will call to schedule your test 2-4 weeks out understanding that some insurance companies will need an authorization prior to the service being performed.  ? ?For non-scheduling related questions, please contact the cardiac imaging nurse navigator should you have any questions/concerns: ?Rockwell Alexandria, Cardiac Imaging Nurse Navigator ?Larey Brick, Cardiac Imaging Nurse  Navigator ?[facility] ?Direct Office Dial: [phone]  ? ?For scheduling needs, including cancellations and rescheduling, please call [place], [phone].  ? ? ? ?

## 2022-02-14 NOTE — Progress Notes (Signed)
? ? ?Office Visit  ?  ?Patient Name: Donald RogersRonny O Roberts ?Date of Encounter: 02/14/2022 ? ?Primary Care Provider:  Excell SeltzerBedsole, Amy E, MD ?Primary Cardiologist:  Olga MillersBrian Crenshaw, MD ? ?Chief Complaint  ?  ?71 year old male with a history of non-obstructive CAD, PAD, chronic chest pain, dilation of ascending thoracic aorta, hypertension, hyperlipidemia, and tobacco use presents for follow-up related to chest pain. ? ?Past Medical History  ?  ?Past Medical History:  ?Diagnosis Date  ? Abdominal pain, generalized 07/18/2008  ? Abnormal gait   ? ALLERGIC RHINITIS 07/18/2008  ? Allergy   ? Back pain, chronic 10/01/2011  ? Carpal tunnel syndrome on left   ? Chronic low back pain 12/25/2012  ? COLONIC POLYPS, HX OF 07/18/2008  ? CORONARY ARTERY DISEASE 07/18/2008  ? Degenerative arthritis of right knee 10/01/2011  ? DIVERTICULOSIS, COLON 07/18/2008  ? Gross hematuria 07/18/2008  ? Headache(784.0)   ? Hx: of Migraines as a child  ? Heart murmur   ? HYPERLIPIDEMIA 07/18/2008  ? Hypertension   ? Idiopathic progressive neuropathy   ? PVD (peripheral vascular disease) (HCC)   ? TOBACCO ABUSE 04/06/2009  ? ?Past Surgical History:  ?Procedure Laterality Date  ? ANTERIOR CERVICAL DECOMP/DISCECTOMY FUSION N/A 09/10/2013  ? Procedure: ANTERIOR CERVICAL DECOMPRESSION/DISCECTOMY FUSION CERVICAL FOUR-FIVE,CERVICAL FIVE-SIX WITH PLATING AND BONE GRAFT;  Surgeon: Karn CassisErnesto M Botero, MD;  Location: [facility];  Service: Neurosurgery;  Laterality: N/A;  ? ANTERIOR CERVICAL DECOMP/DISCECTOMY FUSION N/A 03/14/2015  ? Procedure: ANTERIOR CERVICAL DECOMPRESSION/DISCECTOMY FUSION  CERVICAL TWO-THREE;  Surgeon: Hilda LiasErnesto Botero, MD;  Location: [facility];  Service: Neurosurgery;  Laterality: N/A;  ? ANTERIOR CERVICAL DECOMP/DISCECTOMY FUSION N/A 10/03/2020  ? Procedure: Anterior Cervial Discectomy and Fusion Cervical three and cervical four;  Surgeon: Julio SicksPool, Henry, MD;  [facility];  Service: Neurosurgery;  Laterality: N/A;  3C  ? CARDIAC CATHETERIZATION    ?  COLONOSCOPY    ? COLONOSCOPY W/ BIOPSIES AND POLYPECTOMY    ? Hx: of  ? LEFT HEART CATH AND CORONARY ANGIOGRAPHY N/A 11/04/2018  ? Procedure: LEFT HEART CATH AND CORONARY ANGIOGRAPHY;  Surgeon: Orpah CobbKadakia, Ajay, MD;  Location: [facility];  Service: Cardiovascular;  Laterality: N/A;  ? lumbar surgury    ? dr botero/ns; 3 level fusion per pt  ? POLYPECTOMY    ? s/p cervical disease x 2    ? ? ?Allergies ? ?No Known Allergies ? ?History of Present Illness  ?  ?Male with the above past medical history including non-obstructive CAD, PAD, chronic chest pain, dilation of ascending thoracic aorta, hypertension, hyperlipidemia, and tobacco use. ? ?Previously followed by Dr. Algie CofferKadakia, now followed by Dr. Jens Somrenshaw.  Cardiac catheterization in December 2019 showed 25% pLAD stenosis.  Abdominal ultrasound in February 2020 showed greater than 50% stenosis in the left common iliac and external iliac arteries, but no abdominal aortic aneurysm.  CTA chest in September 2021 showed coronary artery calcifications and a 4 cm ascending thoracic aortic aneurysm.  He was last seen in the office on 12/13/2021 and was stable from a cardiac standpoint.  He reported chronic chest pain relieved with nitroglycerin. However, this was longstanding and unchanged, with no plans for further ischemic evaluation.  Routine follow-up CT angio the chest/aorta in March 2023 showed high-grade ectasia of the ascending aorta measuring up to 3.9 cm, not significantly changed from prior scan in September 2021.  Repeat scan recommended in 1 year. Patient's wife called the office on 02/04/2022 with concerns about 3 episodes of  resting chest pain over the past 2 months, relieved with nitroglycerin and requested an appointment to discuss further ischemic evaluation. ? ?He presents today for follow-up. Since his last visit he has been stable overall from a cardiac standpoint, though he does report at least 2 episodes of chest pain radiating to his left arm at rest  since his last visit. His symptoms last for minutes at a time, and are relieved with nitroglycerin.  He denies exertional symptoms, though, over the last year he has noticed increased generalized fatigue. He denies dyspnea, palpitations, edema, pnd, orthopnea, weight gain, presyncope, or syncope. He states he will be leaving the country in April to travel to the [place] for a month. He continues to smoke. He and his wife are concerned that his symptoms could be coming from progressive CAD.  Other than his chest pain and fatigue, he denies any additional concerns or complaints. ? ?Home Medications  ?  ?Current Outpatient Medications  ?Medication Sig Dispense Refill  ? aspirin EC 81 MG EC tablet Take 1 tablet (81 mg total) by mouth daily.    ? cyclobenzaprine (FLEXERIL) 10 MG tablet Take 1 tablet (10 mg total) by mouth 3 (three) times daily as needed for muscle spasms. 30 tablet 0  ? DULoxetine (CYMBALTA) 60 MG capsule Take 1 capsule (60 mg total) by mouth daily. 90 capsule 3  ? ezetimibe (ZETIA) 10 MG tablet Take 1 tablet (10 mg total) by mouth daily. 90 tablet 3  ? HYDROcodone-acetaminophen (NORCO) 10-325 MG tablet Take 1 tablet by mouth every 6 (six) hours as needed for moderate pain. 40 tablet 0  ? metoprolol tartrate (LOPRESSOR) 100 MG tablet Take 1 tablet 2 hours prior to scan 1 tablet 0  ? nitroGLYCERIN (NITROSTAT) 0.4 MG SL tablet DISSOLVE 1 TABLET UNDER TONGUE AT ONSET OF CHEST PAIN. MAY REPEAT DOSE EVERY 5 MINUTES UP TO 3 DOSES AS NEEDED. IF PAIN NOT RELIEVED, CALL 911. Strength: 0.4 mg 25 tablet 3  ? NONFORMULARY OR COMPOUNDED ITEM Transdermal therapeutics ( meloxicam 0.5%, gabapentin 6%, lidocaine 2%, prilocaine 2%)    ? pregabalin (LYRICA) 150 MG capsule Take 150 mg daily at night. 90 capsule 0  ? ramipril (ALTACE) 5 MG capsule Take 1 capsule (5 mg total) by mouth daily. 90 capsule 3  ? rosuvastatin (CRESTOR) 40 MG tablet Take 1 tablet (40 mg total) by mouth daily. 90 tablet 3  ? ?No current  facility-administered medications for this visit.  ?  ? ?Review of Systems  ?  ?He denies palpitations, dyspnea, pnd, orthopnea, n, v, dizziness, syncope, edema, weight gain, or early satiety. All other systems reviewed and are otherwise negative except as noted above.  ? ?Physical Exam  ?  ?VS:  BP 124/70   Pulse 68   Ht 5\' 10"  (1.778 m)   Wt 151 lb 9.6 oz (68.8 kg)   SpO2 97%   BMI 21.75 kg/m?   ?GEN: Well nourished, well developed, in no acute distress. ?HEENT: normal. ?Neck: Supple, no JVD, carotid bruits, or masses. ?Cardiac: RRR, no murmurs, rubs, or gallops. No clubbing, cyanosis, edema.  Radials/DP/PT 2+ and equal bilaterally.  ?Respiratory:  Respirations regular and unlabored, clear to auscultation bilaterally. ?GI: Soft, nontender, nondistended, BS + x 4. ?MS: no deformity or atrophy. ?Skin: warm and dry, no rash. ?Neuro:  Strength and sensation are intact. ?Psych: Normal affect. ? ?Accessory Clinical Findings  ?  ?ECG personally reviewed by me today -NSR, 68 bpm, nonspecific ST/T wave changes, unchanged from prior  EKG- no acute changes. ? ?Lab Results  ?Component Value Date  ? WBC 9.2 10/03/2020  ? HGB 12.8 (L) 10/03/2020  ? HCT 39.3 10/03/2020  ? MCV 91.6 10/03/2020  ? PLT 282 10/03/2020  ? ?Lab Results  ?Component Value Date  ? CREATININE 0.90 02/07/2022  ? BUN 10 12/13/2021  ? NA 137 12/13/2021  ? K 4.5 12/13/2021  ? CL 102 12/13/2021  ? CO2 25 12/13/2021  ? ?Lab Results  ?Component Value Date  ? ALT 15 02/01/2022  ? AST 22 02/01/2022  ? ALKPHOS 52 02/01/2022  ? BILITOT 0.5 02/01/2022  ? ?Lab Results  ?Component Value Date  ? CHOL 103 02/01/2022  ? HDL 49 02/01/2022  ? LDLCALC 43 02/01/2022  ? TRIG 54 02/01/2022  ? CHOLHDL 2.1 02/01/2022  ?  ?Lab Results  ?Component Value Date  ? HGBA1C 5.4 01/24/2020  ? ? ?Assessment & Plan  ?  ?1. Non-obstructive CAD/chronic chest pain/fatigue: Cath in 2019 showed 25% pLAD stenosis.  Since that time he has reported progressive fatigue, intermittent chest pain  that radiates to the left arm at rest, relieved with nitroglycerin.  He denies exertional symptoms, denies dyspnea, pnd, orthopnea, edema, or weight gain. His wife is very concerned that his symptoms could be related to prog

## 2022-02-19 NOTE — Addendum Note (Signed)
Addended by: Lamar Benes on: 02/19/2022 12:23 PM ? ? Modules accepted: Orders ? ?

## 2022-02-21 ENCOUNTER — Ambulatory Visit: Admission: RE | Admit: 2022-02-21 | Payer: Medicare HMO | Source: Ambulatory Visit

## 2022-02-27 ENCOUNTER — Ambulatory Visit (HOSPITAL_COMMUNITY): Payer: Medicare HMO

## 2022-02-28 ENCOUNTER — Ambulatory Visit
Admission: EM | Admit: 2022-02-28 | Discharge: 2022-02-28 | Disposition: A | Discharge status: Home / Self Care | Payer: Medicare HMO | Attending: Internal Medicine | Admitting: Internal Medicine

## 2022-02-28 ENCOUNTER — Other Ambulatory Visit: Payer: Self-pay

## 2022-02-28 ENCOUNTER — Telehealth: Payer: Self-pay | Admitting: Emergency Medicine

## 2022-02-28 ENCOUNTER — Encounter: Payer: Self-pay | Admitting: Emergency Medicine

## 2022-02-28 DIAGNOSIS — S0501XA Injury of conjunctiva and corneal abrasion without foreign body, right eye, initial encounter: Secondary | ICD-10-CM | POA: Diagnosis not present

## 2022-02-28 MED ORDER — ERYTHROMYCIN 5 MG/GM OP OINT: TOPICAL_OINTMENT | OPHTHALMIC | 0 refills | Status: DC

## 2022-02-28 NOTE — Telephone Encounter (Signed)
Spoke to patient on phone , verified identity with 2 identifiers.  Patient reports he is having worse eye pain than when here earlier today.  Patient mentions repeatedly about a medicine for pain to put in eye.  Patient was instructed this is not going to be written for from this location.  Encouraged patient to follow up with eye specialist or ED.  Patient gives impression that eye pain is worse than incidents in the past and worse than it was this morning.  Ervin Knack, NP aware of patient request and complaints.  Again , encouraged patient to go to ED to have a detailed exam to get to the real cause of pain ?

## 2022-02-28 NOTE — Discharge Instructions (Signed)
It appears that you have an abrasion to your eye.  This is being treated with antibiotic ointment.  It should self resolve on its own in the next few days.  Please follow-up with eye doctor if symptoms persist or worsen. ?

## 2022-02-28 NOTE — ED Provider Notes (Addendum)
?[facility] ? ? ? ?CSN: [id number] ?Arrival date & time: 02/28/22  2229 ? ? ?  ? ?History   ?Chief Complaint ?Chief Complaint  ?Patient presents with  ? Eye Problem  ? ? ?HPI ?Donald Roberts is a 71 y.o. male.  ? ?Patient presents due to possible foreign body in right eye.  Patient reports that he was sawing wood yesterday and thinks that some sawdust got in his right eye.  He has washed his eye out multiple times but symptoms have been persistent.  Patient reports right eye irritation and feeling of foreign body in the right eye.  Also having right eye pain.  Denies any blurry vision.  Patient does not wear contacts.  Denies any purulent drainage. ? ? ?Eye Problem ? ?Past Medical History:  ?Diagnosis Date  ? Abdominal pain, generalized 07/18/2008  ? Abnormal gait   ? ALLERGIC RHINITIS 07/18/2008  ? Allergy   ? Back pain, chronic 10/01/2011  ? Carpal tunnel syndrome on left   ? Chronic low back pain 12/25/2012  ? COLONIC POLYPS, HX OF 07/18/2008  ? CORONARY ARTERY DISEASE 07/18/2008  ? Degenerative arthritis of right knee 10/01/2011  ? DIVERTICULOSIS, COLON 07/18/2008  ? Gross hematuria 07/18/2008  ? Headache(784.0)   ? Hx: of Migraines as a child  ? Heart murmur   ? HYPERLIPIDEMIA 07/18/2008  ? Hypertension   ? Idiopathic progressive neuropathy   ? PVD (peripheral vascular disease) (HCC)   ? TOBACCO ABUSE 04/06/2009  ? ? ?Patient Active Problem List  ? Diagnosis Date Noted  ? Cervical myelopathy (HCC) 10/03/2020  ? Aortic atherosclerosis (HCC) 08/13/2019  ? Fatigue 06/15/2019  ? Chronic insomnia 11/23/2018  ? Chest pain, precordial 10/28/2018  ?  Class: Acute  ? Acute coronary syndrome (HCC) 10/28/2018  ? Cervicalgia 05/11/2018  ? Spondylosis without myelopathy or radiculopathy, cervical region 04/22/2018  ? Cervical facet syndrome (Left) 04/22/2018  ? Neurogenic pain 04/22/2018  ? Cervical spondylosis with radiculopathy 04/21/2018  ? Osteoarthritis of knee (Right) 04/21/2018  ? History of cervical spinal surgery  04/21/2018  ? 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 Long term prescription benzodiazepine use 04/21/2018  ? Stopped smoking with greater than 40 pack year history 04/17/2018  ? Chronic neck pain (Primary Area of Pain) (Bilateral) (L>R) 04/01/2018  ? Chronic shoulder pain (Secondary Area of Pain) (Left) 04/01/2018  ? Chronic low back pain [facility] Area of Pain) (Bilateral) 04/01/2018  ? Chronic pain syndrome 04/01/2018  ? Long term current use of opiate analgesic 04/01/2018  ? Pharmacologic therapy 04/01/2018  ? Disorder of skeletal system 04/01/2018  ? Problems influencing health status 04/01/2018  ? Chronic cervical radiculopathy 03/17/2018  ? Personal history of tobacco use, presenting hazards to health 02/03/2017  ? Hyperlipidemia   ? CAD (coronary artery disease)   ? Personal history of colonic polyps   ? ? ?Past Surgical History:  ?Procedure Laterality Date  ? ANTERIOR CERVICAL DECOMP/DISCECTOMY FUSION N/A 09/10/2013  ? Procedure: ANTERIOR CERVICAL DECOMPRESSION/DISCECTOMY FUSION CERVICAL FOUR-FIVE,CERVICAL FIVE-SIX WITH PLATING AND BONE GRAFT;  Surgeon: Karn Cassis, MD;  Location: [facility];  Service: Neurosurgery;  Laterality: N/A;  ? ANTERIOR CERVICAL DECOMP/DISCECTOMY FUSION N/A 03/14/2015  ? Procedure: ANTERIOR CERVICAL DECOMPRESSION/DISCECTOMY FUSION  CERVICAL TWO-THREE;  Surgeon: Hilda Lias, MD;  Location: [facility];  Service: Neurosurgery;  Laterality: N/A;  ? ANTERIOR CERVICAL DECOMP/DISCECTOMY FUSION N/A 10/03/2020  ? Procedure: Anterior Cervial Discectomy and Fusion Cervical three and cervical four;  Surgeon: Julio Sicks, MD;  Location: [facility];  Service: Neurosurgery;  Laterality: N/A;  3C  ? CARDIAC CATHETERIZATION    ? COLONOSCOPY    ? COLONOSCOPY W/ BIOPSIES AND POLYPECTOMY    ? Hx: of  ? LEFT HEART CATH AND CORONARY ANGIOGRAPHY N/A 11/04/2018  ? Procedure: LEFT HEART CATH AND CORONARY ANGIOGRAPHY;  Surgeon: Orpah CobbKadakia, Ajay, MD;  Location: [facility];  Service:  Cardiovascular;  Laterality: N/A;  ? lumbar surgury    ? dr botero/ns; 3 level fusion per pt  ? POLYPECTOMY    ? s/p cervical disease x 2    ? ? ? ? ? ?Home Medications   ? ?Prior to Admission medications   ?Medication Sig Start Date End Date Taking? Authorizing Provider  ?erythromycin ophthalmic ointment Place a 1/2 inch ribbon of ointment into the lower eyelid 4 times daily for 7 days. 02/28/22  Yes Ervin KnackMound, Niaja Stickley E, FNP  ?aspirin EC 81 MG EC tablet Take 1 tablet (81 mg total) by mouth daily. 10/29/18   Orpah CobbKadakia, Ajay, MD  ?cyclobenzaprine (FLEXERIL) 10 MG tablet Take 1 tablet (10 mg total) by mouth 3 (three) times daily as needed for muscle spasms. 10/03/20   Julio SicksPool, Henry, MD  ?DULoxetine (CYMBALTA) 60 MG capsule Take 1 capsule (60 mg total) by mouth daily. 09/25/20   Lomax, Amy, NP  ?ezetimibe (ZETIA) 10 MG tablet Take 1 tablet (10 mg total) by mouth daily. 12/14/21   Lewayne Buntingrenshaw, Brian S, MD  ?HYDROcodone-acetaminophen (NORCO) 10-325 MG tablet Take 1 tablet by mouth every 6 (six) hours as needed for moderate pain. 10/03/20   Julio SicksPool, Henry, MD  ?metoprolol tartrate (LOPRESSOR) 100 MG tablet Take 1 tablet 2 hours prior to scan 02/14/22   Joylene GrapesMonge, Emily C, NP  ?nitroGLYCERIN (NITROSTAT) 0.4 MG SL tablet DISSOLVE 1 TABLET UNDER TONGUE AT ONSET OF CHEST PAIN. MAY REPEAT DOSE EVERY 5 MINUTES UP TO 3 DOSES AS NEEDED. IF PAIN NOT RELIEVED, CALL 911. Strength: 0.4 mg 12/13/21   Lewayne Buntingrenshaw, Brian S, MD  ?NONFORMULARY OR COMPOUNDED ITEM Transdermal therapeutics ( meloxicam 0.5%, gabapentin 6%, lidocaine 2%, prilocaine 2%)    [provider]  ?pregabalin (LYRICA) 150 MG capsule Take 150 mg daily at night. 05/13/19   Bedsole, Amy E, MD  ?ramipril (ALTACE) 5 MG capsule Take 1 capsule (5 mg total) by mouth daily. 12/13/21   Lewayne Buntingrenshaw, Brian S, MD  ?rosuvastatin (CRESTOR) 40 MG tablet Take 1 tablet (40 mg total) by mouth daily. 12/13/21 03/13/22  Lewayne Buntingrenshaw, Brian S, MD  ? ? ?Family History ?Family History  ?Problem Relation Age of Onset  ?  Heart attack Father   ? Heart disease Brother   ? Heart disease Mother   ? Heart disease Other   ? Parkinsonism Paternal Uncle   ? Colon cancer Neg Hx   ? Colon polyps Neg Hx   ? Esophageal cancer Neg Hx   ? Rectal cancer Neg Hx   ? Stomach cancer Neg Hx   ? ? ?Social History ?Social History  ? ?Tobacco Use  ? Smoking status: Every Day  ?  Packs/day: 0.50  ?  Years: 40.00  ?  Pack years: 20.00  ?  Types: Cigarettes  ? Smokeless tobacco: Never  ?Vaping Use  ? Vaping Use: Never used  ?Substance Use Topics  ? Alcohol use: Yes  ?  Comment: occasional  ? Drug use: No  ? ? ? ?Allergies   ?Patient has no known allergies. ? ? ?Review of Systems ?Review of Systems ?Per HPI ? ?  Physical Exam ?Triage Vital Signs ?ED Triage Vitals  ?Enc Vitals Group  ?   BP 02/28/22 0951 138/74  ?   Pulse Rate 02/28/22 0951 63  ?   Resp 02/28/22 0951 (!) 22  ?   Temp 02/28/22 0951 97.7 ?F (36.5 ?C)  ?   Temp Source 02/28/22 0951 Oral  ?   SpO2 02/28/22 0951 98 %  ?   Weight --   ?   Height --   ?   Head Circumference --   ?   Peak Flow --   ?   Pain Score 02/28/22 0949 5  ?   Pain Loc --   ?   Pain Edu? --   ?   Excl. in GC? --   ? ?No data found. ? ?Updated Vital Signs ?BP 138/74 (BP Location: Left Arm)   Pulse 63   Temp 97.7 ?F (36.5 ?C) (Oral)   Resp (!) 22   SpO2 98%  ? ?Visual Acuity ?Right Eye Distance: 20/25 ?Left Eye Distance: 20/50 (notified Lalah Durango Jerriann Schrom, np) ?Bilateral Distance: 20/25 ? ?Right Eye Near:   ?Left Eye Near:    ?Bilateral Near:    ? ?Physical Exam ?Constitutional:   ?   General: He is not in acute distress. ?   Appearance: Normal appearance. He is not toxic-appearing or diaphoretic.  ?HENT:  ?   Head: Normocephalic and atraumatic.  ?Eyes:  ?   General: Lids are normal. Lids are everted, no foreign bodies appreciated. Vision grossly intact. Gaze aligned appropriately.  ?   Extraocular Movements: Extraocular movements intact.  ?   Conjunctiva/sclera: Conjunctivae normal.  ?   Pupils: Pupils are equal, round, and reactive  to light.  ?   Right eye: Corneal abrasion and fluorescein uptake present.  ?   Comments: Small corneal abrasion present overlying right side of iris.  Fluorescein reuptake at this area.  ?Pulmonary:  ?   Effort: Pulmo

## 2022-02-28 NOTE — ED Triage Notes (Signed)
Reports sawing wood yesterday, thinks he has saw dust in right eye.  Reports right eye is watering.  Open, not nearly as painful.  Closing eye lid is very painful.   ?

## 2022-03-18 ENCOUNTER — Other Ambulatory Visit
Admission: RE | Admit: 2022-03-18 | Discharge: 2022-03-18 | Disposition: A | Discharge status: Home / Self Care | Payer: Medicare HMO | Source: Ambulatory Visit | Attending: Nurse Practitioner | Admitting: Nurse Practitioner

## 2022-03-18 DIAGNOSIS — R0789 Other chest pain: Secondary | ICD-10-CM | POA: Diagnosis present

## 2022-03-18 LAB — BASIC METABOLIC PANEL
Anion gap: 7 (ref 5–15)
BUN: 15 mg/dL (ref 8–23)
CO2: 26 mmol/L (ref 22–32)
Calcium: 8.9 mg/dL (ref 8.9–10.3)
Chloride: 104 mmol/L (ref 98–111)
Creatinine, Ser: 0.84 mg/dL (ref 0.61–1.24)
GFR, Estimated: >60 mL/min (ref >60)
Glucose, Bld: 91 mg/dL (ref 70–99)
Potassium: 4 mmol/L (ref 3.5–5.1)
Sodium: 137 mmol/L (ref 135–145)

## 2022-03-20 ENCOUNTER — Telehealth: Payer: Self-pay

## 2022-03-20 ENCOUNTER — Telehealth (HOSPITAL_COMMUNITY): Payer: Self-pay | Admitting: Emergency Medicine

## 2022-03-20 NOTE — Telephone Encounter (Signed)
Spoke with pt. Pt was notified of lab results. Pt will continue  current medications and follow up as planned.  ?

## 2022-03-20 NOTE — Telephone Encounter (Signed)
Reaching out to patient to offer assistance regarding upcoming cardiac imaging study; pt verbalizes understanding of appt date/time, parking situation and where to check in, pre-test NPO status and medications ordered, and verified current allergies; name and call back number provided for further questions should they arise ?Marchia Bond RN Navigator Cardiac Imaging ?[facility] ?[phone] office ?[phone] cell ? ?Denies iv issues ?100mg  metoprolol tartrate  ?Arrival 1045 ? ?

## 2022-03-21 ENCOUNTER — Ambulatory Visit
Admission: RE | Admit: 2022-03-21 | Discharge: 2022-03-21 | Disposition: A | Discharge status: Home / Self Care | Payer: Medicare HMO | Source: Ambulatory Visit | Attending: Nurse Practitioner | Admitting: Nurse Practitioner

## 2022-03-21 ENCOUNTER — Other Ambulatory Visit: Payer: Self-pay

## 2022-03-21 DIAGNOSIS — I2510 Atherosclerotic heart disease of native coronary artery without angina pectoris: Secondary | ICD-10-CM

## 2022-03-21 MED ORDER — IOHEXOL 350 MG/ML SOLN
75.0000 mL | Freq: Once | INTRAVENOUS | Status: AC | PRN
Start: 2022-03-21 — End: 2022-03-21
  Administered 2022-03-21: 75 mL via INTRAVENOUS

## 2022-03-21 MED ORDER — NITROGLYCERIN 0.4 MG SL SUBL
0.4000 mg | SUBLINGUAL_TABLET | Freq: Once | SUBLINGUAL | Status: AC
Start: 2022-03-21 — End: 2022-03-21
  Administered 2022-03-21: 0.4 mg via SUBLINGUAL

## 2022-03-21 NOTE — Progress Notes (Signed)
Patient tolerated procedure well. Ambulate w/o difficulty. Denies light headedness or being dizzy. Sitting in chair drinking water provided. Encouraged to drink extra water today and reasoning explained. Verbalized understanding. All questions answered. ABC intact. No further needs. Discharge from procedure area w/o issues.   °

## 2022-03-26 ENCOUNTER — Telehealth: Payer: Self-pay | Admitting: Nurse Practitioner

## 2022-03-26 NOTE — Telephone Encounter (Signed)
Spoke with the patient who is calling in regards to his CTA scan results. Advised that it has not been reviewed by the provider yet and we will contact him with recommendations and soon as she does.  ?

## 2022-03-26 NOTE — Telephone Encounter (Signed)
Pt calling to get ECHO results. Please advise ?

## 2022-03-27 NOTE — Telephone Encounter (Signed)
Noted  

## 2022-03-28 NOTE — Telephone Encounter (Signed)
Patient wants to know the results of his cardiac CT. He is traveling out of the country in a couple of weeks and wants to know if it is ok to travel. Please advise. ?

## 2022-03-28 NOTE — Telephone Encounter (Signed)
Patient is calling back in regards to still not receiving his CT results. He states he will wait until about this time tomorrow before he comes down to the office to get the results.   ?

## 2022-03-29 ENCOUNTER — Telehealth: Payer: Self-pay | Admitting: Family Medicine

## 2022-03-29 NOTE — Telephone Encounter (Signed)
Type of forms received:Dental Surgery Clearance ? ?Routed [id number] ? ?Paperwork received by : Gwynn Burly ? ? ?Individual made aware of 3-5 business day turn around (Y/N): Y ? ?Form completed and patient made aware of charges(Y/N): Y ? ? ?Faxed to :  ? ?Form location:  Place in mail box ? ?

## 2022-03-29 NOTE — Telephone Encounter (Signed)
To review of the upcoming dental procedure, it is obvious that is a more extensive procedure than typical.  Given his coronary artery disease I would suggest he get clearance from cardiology.  Please also make him an office visit here for preop evaluation.  They are requesting EKG and labs. ?Hold the papers until his office visit ?

## 2022-03-29 NOTE — Telephone Encounter (Signed)
Forms placed in Dr. Bedsole's office in box to complete. 

## 2022-04-01 ENCOUNTER — Telehealth: Payer: Self-pay

## 2022-04-01 NOTE — Telephone Encounter (Addendum)
Pt called back to say he was just cleared by cariology today. I made him an appt with Dr Diona Browner on 5-16. ?

## 2022-04-01 NOTE — Telephone Encounter (Signed)
? ?  Pre-operative Risk Assessment  ?  ?Patient Name: Donald Roberts  ?DOB: [birth year] ?MRN: [id number]  ? ?Request for Surgical Clearance   ? ?Procedure:  Dental Extraction - Amount of Teeth to be Pulled:  15 or more-AND SMOOTHING OF RESIDUAL BONE ?  ?Date of Surgery:  Clearance 05/07/22                              ?  ?Surgeon:  NOT LISTED ?Surgeon's Group or Practice Name:  [facility] ?Phone number:  [phone] ?Fax number:  [phone] ?  ?Type of Clearance Requested:   ?- Medical  ?  ?Type of Anesthesia:  MAC ?  ?Additional requests/questions:   ALSO: ?-PLACEMENT OF AT LEAST 4 ENDOSSEOUS DENTAL IMPLANTS ? ?-2-3 HOURS OF MODERATE SEDATION-LOCAL ANESTHESIA WITH EPI ? ?-ESTIMATED 500 mL OF INTRAOPERATIVE BLOOD LOSS ? ? ?

## 2022-04-01 NOTE — Telephone Encounter (Signed)
Left message for [name] that he is going to need a Pre-Op Clearance appointment with Dr. Ermalene Searing so we can do labs and EKG.  I also advised that with his CAD, Dr. Ermalene Searing also feels he is going to need clearance from his cardiologist as well.  I ask that he call our office back at [phone] to schedule.  ?

## 2022-04-01 NOTE — Telephone Encounter (Addendum)
? ?  Patient Name: Donald Roberts  ?DOB: [birth year] ?MRN: [id number] ? ?Primary Cardiologist: Olga Millers, MD ? ?Chart reviewed as part of pre-operative protocol coverage.  ? ?The patient already has an upcoming appointment scheduled 04/22/22 with Bernadene Person, NP at which time this clearance can be addressed in case there are any new issues addressed that would impact pre-operative recommendations. (Procedure date of 05/07/22 falls after appointment.)  ? ?Tentatively do not see any SBE ppx needs but this can be clarified at OV. ? ?- I added "preop" comment to appointment notes so that provider is aware to address at time of OV. Per office protocol, the provider seeing this patient should forward their finalized clearance decision to requesting party below. ? ?- Will fax update to requesting surgeon so they are aware and remove from preop box as separate preop APP input not necessary at this time. ? ?Laurann Montana, PA-C ?04/01/2022, 12:17 PM ? ? ?

## 2022-04-16 ENCOUNTER — Encounter: Payer: Self-pay | Admitting: Family Medicine

## 2022-04-16 ENCOUNTER — Ambulatory Visit (INDEPENDENT_AMBULATORY_CARE_PROVIDER_SITE_OTHER): Payer: Medicare HMO | Admitting: Family Medicine

## 2022-04-16 VITALS — BP 90/64 | HR 58 | Temp 97.8°F | Ht 69.0 in | Wt 149.2 lb

## 2022-04-16 DIAGNOSIS — I959 Hypotension, unspecified: Secondary | ICD-10-CM | POA: Diagnosis not present

## 2022-04-16 DIAGNOSIS — H6121 Impacted cerumen, right ear: Secondary | ICD-10-CM | POA: Diagnosis not present

## 2022-04-16 DIAGNOSIS — Z01818 Encounter for other preprocedural examination: Secondary | ICD-10-CM

## 2022-04-16 DIAGNOSIS — I2510 Atherosclerotic heart disease of native coronary artery without angina pectoris: Secondary | ICD-10-CM | POA: Diagnosis not present

## 2022-04-16 LAB — CBC WITH DIFFERENTIAL/PLATELET
Basophils Absolute: 0 10*3/uL (ref 0.0–0.1)
Basophils Relative: 0.4 % (ref 0.0–3.0)
Eosinophils Absolute: 0.1 10*3/uL (ref 0.0–0.7)
Eosinophils Relative: 0.9 % (ref 0.0–5.0)
HCT: 37.2 % — ABNORMAL LOW (ref 39.0–52.0)
Hemoglobin: 12.3 g/dL — ABNORMAL LOW (ref 13.0–17.0)
Lymphocytes Relative: 21.3 % (ref 12.0–46.0)
Lymphs Abs: 1.8 10*3/uL (ref 0.7–4.0)
MCHC: 33.1 g/dL (ref 30.0–36.0)
MCV: 87.6 fl (ref 78.0–100.0)
Monocytes Absolute: 1 10*3/uL (ref 0.1–1.0)
Monocytes Relative: 11.5 % (ref 3.0–12.0)
Neutro Abs: 5.5 10*3/uL (ref 1.4–7.7)
Neutrophils Relative %: 65.9 % (ref 43.0–77.0)
Platelets: 352 10*3/uL (ref 150.0–400.0)
RBC: 4.24 Mil/uL (ref 4.22–5.81)
RDW: 15.2 % (ref 11.5–15.5)
WBC: 8.4 10*3/uL (ref 4.0–10.5)

## 2022-04-16 NOTE — Assessment & Plan Note (Signed)
Patient with good exercise tolerance.  Given coronary artery disease he will have cardiovascular screening by Dr. Stanford Breed prior to procedure. ?EKG performed 3 weeks ago at last cardiology office visit. ?No respiratory concerns although patient was encouraged to quit smoking. ? ?He has had multiple surgeries in the past with no complications. ? ?His blood pressure was low normal today in the office without any lightheadedness.  He did take his pain medication right prior to coming into the office and could be lowering his blood pressure.  He had also also lost weight with poor eating and has not been drinking a lot of water given his BPH symptoms.  He will start pushing water earlier in the day and taper off till later in the day.  We may need to lower his ramipril dose if his blood pressure continues to remain low.  He will follow closely at home and will update me prior to the surgery. ? ?He has had recent labs with normal electrolytes, liver and kidney function.  I will send him for CBC today. ?

## 2022-04-16 NOTE — Progress Notes (Addendum)
? ? Patient ID: Donald Roberts, male    DOB: Mar 22, 1951, 71 y.o.   MRN: [id number] ? ?This visit was conducted in person. ? ?BP 90/64   Pulse (!) 58   Temp 97.8 ?F (36.6 ?C) (Oral)   Ht 5\' 9"  (1.753 m)   Wt 149 lb 4 oz (67.7 kg)   SpO2 98%   BMI 22.04 kg/m?   ? ?CC:  ?Chief Complaint  ?Patient presents with  ? Pre-op Exam  ? ? ?Subjective:  ? ?HPI: ?Donald Roberts is a 71 y.o. male presenting on 04/16/2022 for Pre-op Exam ? ?Donald Roberts presents to the office for an extensive dental procedure on 6, 05/2022 for placement of 4 endosseous dental implants.  This will require 2 to 3 hours of moderate sedation as well as local anesthesia with epi.  Estimated expected blood loss 500 mL. ?  ?Given his history of coronary artery disease, he has had preop clearance scheduled with Dr. 06/2022 his cardiologist. ? ?He currently has no pulmonary diagnoses but is a current smoker. ? ?We reviewed his recent basic metabolic panel from March 18, 2022 as well as his lipid panel and hepatic panel from 3, 3, 2023: All of these were in normal range ? Reviewed EKG ? ? He has had trouble eating given his dental issues. Lost 15 lbs  in last 2 years per pt. ? ?His blood pressure is low normal on low-dose ramipril for cardiovascular health. ?   He has been having issues with urinary frequency.2024 on  vesicare which is now helping in last week ? Has been drinking less water. ? He did take pain med prior to coming today.. this always lowers his  BP. ? No lightheadedness. ?BP Readings from Last 3 Encounters:  ?04/16/22 90/64  ?03/21/22 (!) 102/51  ?02/28/22 138/74  ? ? No SOB, no wheeze. No SE when exercises. ? ? No past  complications with past surgeries for back.  ?He is very active  in [place]. [place].. walking constantly with no symptoms. ? ? ?Relevant past medical, surgical, family and social history reviewed and updated as indicated. Interim medial history since our last visit reviewed. ?Allergies and medications reviewed and updated. ?Outpatient  Medications Prior to Visit  ?Medication Sig Dispense Refill  ? aspirin EC 81 MG EC tablet Take 1 tablet (81 mg total) by mouth daily.    ? DULoxetine (CYMBALTA) 60 MG capsule Take 1 capsule (60 mg total) by mouth daily. 90 capsule 3  ? ezetimibe (ZETIA) 10 MG tablet Take 1 tablet (10 mg total) by mouth daily. 90 tablet 3  ? HYDROcodone-acetaminophen (NORCO) 10-325 MG tablet Take 1 tablet by mouth every 6 (six) hours as needed for moderate pain. 40 tablet 0  ? nitroGLYCERIN (NITROSTAT) 0.4 MG SL tablet DISSOLVE 1 TABLET UNDER TONGUE AT ONSET OF CHEST PAIN. MAY REPEAT DOSE EVERY 5 MINUTES UP TO 3 DOSES AS NEEDED. IF PAIN NOT RELIEVED, CALL 911. Strength: 0.4 mg 25 tablet 3  ? NONFORMULARY OR COMPOUNDED ITEM Transdermal therapeutics ( meloxicam 0.5%, gabapentin 6%, lidocaine 2%, prilocaine 2%)    ? pregabalin (LYRICA) 150 MG capsule Take 150 mg daily at night. 90 capsule 0  ? ramipril (ALTACE) 5 MG capsule Take 1 capsule (5 mg total) by mouth daily. 90 capsule 3  ? solifenacin (VESICARE) 5 MG tablet Take 5 mg by mouth daily.    ? rosuvastatin (CRESTOR) 40 MG tablet Take 1 tablet (40 mg total) by mouth daily. 90 tablet 3  ?  cyclobenzaprine (FLEXERIL) 10 MG tablet Take 1 tablet (10 mg total) by mouth 3 (three) times daily as needed for muscle spasms. 30 tablet 0  ? erythromycin ophthalmic ointment Place a 1/2 inch ribbon of ointment into the lower eyelid 4 times daily for 7 days. 3.5 g 0  ? metoprolol tartrate (LOPRESSOR) 100 MG tablet Take 1 tablet 2 hours prior to scan 1 tablet 0  ? ?No facility-administered medications prior to visit.  ?  ? ?Per HPI unless specifically indicated in ROS section below ?Review of Systems  ?Constitutional:  Negative for fatigue and fever.  ?HENT:  Negative for ear pain.   ?Eyes:  Negative for pain.  ?Respiratory:  Negative for cough and shortness of breath.   ?Cardiovascular:  Negative for chest pain, palpitations and leg swelling.  ?Gastrointestinal:  Negative for abdominal pain.   ?Genitourinary:  Negative for dysuria.  ?Musculoskeletal:  Negative for arthralgias.  ?Neurological:  Negative for syncope, light-headedness and headaches.  ?Psychiatric/Behavioral:  Negative for dysphoric mood.   ?Objective:  ?BP 90/64   Pulse (!) 58   Temp 97.8 ?F (36.6 ?C) (Oral)   Ht 5\' 9"  (1.753 m)   Wt 149 lb 4 oz (67.7 kg)   SpO2 98%   BMI 22.04 kg/m?   ?Wt Readings from Last 3 Encounters:  ?04/16/22 149 lb 4 oz (67.7 kg)  ?02/14/22 151 lb 9.6 oz (68.8 kg)  ?12/13/21 153 lb 12.8 oz (69.8 kg)  ?  ?  ?Physical Exam ?Constitutional:   ?   Appearance: He is well-developed.  ?HENT:  ?   Head: Normocephalic.  ?   Right Ear: Hearing normal.  ?   Left Ear: Hearing normal.  ?   Nose: Nose normal.  ?Neck:  ?   Thyroid: No thyroid mass or thyromegaly.  ?   Vascular: No carotid bruit.  ?   Trachea: Trachea normal.  ?Cardiovascular:  ?   Rate and Rhythm: Normal rate and regular rhythm.  ?   Pulses: Normal pulses.  ?   Heart sounds: Heart sounds not distant. No murmur heard. ?  No friction rub. No gallop.  ?   Comments: No peripheral edema ?Pulmonary:  ?   Effort: Pulmonary effort is normal. No respiratory distress.  ?   Breath sounds: Normal breath sounds.  ?Skin: ?   General: Skin is warm and dry.  ?   Findings: No rash.  ?Psychiatric:     ?   Speech: Speech normal.     ?   Behavior: Behavior normal.     ?   Thought Content: Thought content normal.  ? ?   ?Results for orders placed or performed during the hospital encounter of 03/18/22  ?Basic metabolic panel  ?Result Value Ref Range  ? Sodium 137 135 - 145 mmol/L  ? Potassium 4.0 3.5 - 5.1 mmol/L  ? Chloride 104 98 - 111 mmol/L  ? CO2 26 22 - 32 mmol/L  ? Glucose, Bld 91 70 - 99 mg/dL  ? BUN 15 8 - 23 mg/dL  ? Creatinine, Ser 0.84 0.61 - 1.24 mg/dL  ? Calcium 8.9 8.9 - 10.3 mg/dL  ? GFR, Estimated >60 >60 mL/min  ? Anion gap 7 5 - 15  ? ? ? ?COVID 19 screen:  No recent travel or known exposure to COVID19 ?The patient denies respiratory symptoms of COVID 19 at  this time. ?The importance of social distancing was discussed today.  ? ?Assessment and Plan ?Problem List Items Addressed This  Visit   ? ? CAD (coronary artery disease) (Chronic)  ? Hypotension  ? Pre-op exam - Primary  ?  Patient with good exercise tolerance.  Given coronary artery disease he will have cardiovascular screening by Dr. Jens Som prior to procedure. ?EKG performed 3 weeks ago at last cardiology office visit. ?No respiratory concerns although patient was encouraged to quit smoking. ? ?He has had multiple surgeries in the past with no complications. ? ?His blood pressure was low normal today in the office without any lightheadedness.  He did take his pain medication right prior to coming into the office and could be lowering his blood pressure.  He had also also lost weight with poor eating and has not been drinking a lot of water given his BPH symptoms.  He will start pushing water earlier in the day and taper off till later in the day.  We may need to lower his ramipril dose if his blood pressure continues to remain low.  He will follow closely at home and will update me prior to the surgery. ? ?He has had recent labs with normal electrolytes, liver and kidney function.  I will send him for CBC today. ? ?  ?  ? Relevant Orders  ? CBC with Differential/Platelet  ? ?Right cerumen impaction: ?Simple bilateral ear irrigation with warm water performed by by Terese Door, CMA. without complications Consent obtained verbally.  After re-examination wax plug remained... I removed the plug with alligator forceps. No bleeding but ear canal with irritation from  chronic wax buildup. ? Pt tolerate procedure without complaints. ? ? ?No complications.  Pt tolerated procedure well.  ?Resolved impaction and resolved otitis externa on exam. ?Reviewed prevention of cerumen impaction, information provided. ? ?  ? ?Kerby Nora, MD  ? ?

## 2022-04-16 NOTE — Patient Instructions (Addendum)
Increase water  in water in morning.. taper down until later in day. ? Follow BP at home .. call if remaining < 100/70 despite fluid intake. ? Please stop at the lab to have labs drawn. ? Quit smoking! ? ?

## 2022-04-22 ENCOUNTER — Ambulatory Visit: Payer: Medicare HMO | Admitting: Nurse Practitioner

## 2022-04-22 ENCOUNTER — Encounter: Payer: Self-pay | Admitting: Nurse Practitioner

## 2022-04-22 VITALS — BP 120/78 | HR 87 | Ht 69.0 in | Wt 146.6 lb

## 2022-04-22 DIAGNOSIS — I2510 Atherosclerotic heart disease of native coronary artery without angina pectoris: Secondary | ICD-10-CM

## 2022-04-22 DIAGNOSIS — I739 Peripheral vascular disease, unspecified: Secondary | ICD-10-CM | POA: Diagnosis not present

## 2022-04-22 DIAGNOSIS — I77810 Thoracic aortic ectasia: Secondary | ICD-10-CM

## 2022-04-22 DIAGNOSIS — E785 Hyperlipidemia, unspecified: Secondary | ICD-10-CM

## 2022-04-22 DIAGNOSIS — R5383 Other fatigue: Secondary | ICD-10-CM | POA: Diagnosis not present

## 2022-04-22 DIAGNOSIS — Z720 Tobacco use: Secondary | ICD-10-CM

## 2022-04-22 DIAGNOSIS — Z01810 Encounter for preprocedural cardiovascular examination: Secondary | ICD-10-CM

## 2022-04-22 DIAGNOSIS — I10 Essential (primary) hypertension: Secondary | ICD-10-CM

## 2022-04-22 NOTE — Progress Notes (Addendum)
Office Visit    Patient Name: Donald RogersRonny O Roberts Date of Encounter: 04/22/2022  Primary Care Provider:  Excell SeltzerBedsole, Amy E, MD Primary Cardiologist:  Olga MillersBrian Crenshaw, MD  Chief Complaint    71 year old male with a history of non-obstructive CAD, PAD, chronic chest pain, dilation of ascending thoracic aorta, hypertension, hyperlipidemia, and tobacco use presents for follow-up related to CAD and for preoperative cardiac evaluation.  Past Medical History    Past Medical History:  Diagnosis Date   Abdominal pain, generalized 07/18/2008   Abnormal gait    ALLERGIC RHINITIS 07/18/2008   Allergy    Back pain, chronic 10/01/2011   Carpal tunnel syndrome on left    Chronic low back pain 12/25/2012   COLONIC POLYPS, HX OF 07/18/2008   CORONARY ARTERY DISEASE 07/18/2008   Degenerative arthritis of right knee 10/01/2011   DIVERTICULOSIS, COLON 07/18/2008   Gross hematuria 07/18/2008   Headache(784.0)    Hx: of Migraines as a child   Heart murmur    HYPERLIPIDEMIA 07/18/2008   Hypertension    Idiopathic progressive neuropathy    PVD (peripheral vascular disease) (HCC)    TOBACCO ABUSE 04/06/2009   Past Surgical History:  Procedure Laterality Date   ANTERIOR CERVICAL DECOMP/DISCECTOMY FUSION N/A 09/10/2013   Procedure: ANTERIOR CERVICAL DECOMPRESSION/DISCECTOMY FUSION CERVICAL FOUR-FIVE,CERVICAL FIVE-SIX WITH PLATING AND BONE GRAFT;  Surgeon: Karn CassisErnesto M Botero, MD;  Location: [facility];  Service: Neurosurgery;  Laterality: N/A;   ANTERIOR CERVICAL DECOMP/DISCECTOMY FUSION N/A 03/14/2015   Procedure: ANTERIOR CERVICAL DECOMPRESSION/DISCECTOMY FUSION  CERVICAL TWO-THREE;  Surgeon: Hilda LiasErnesto Botero, MD;  Location: [facility];  Service: Neurosurgery;  Laterality: N/A;   ANTERIOR CERVICAL DECOMP/DISCECTOMY FUSION N/A 10/03/2020   Procedure: Anterior Cervial Discectomy and Fusion Cervical three and cervical four;  Surgeon: Julio SicksPool, Henry, MD;  [facility];  Service: Neurosurgery;  Laterality: N/A;  3C    CARDIAC CATHETERIZATION     COLONOSCOPY     COLONOSCOPY W/ BIOPSIES AND POLYPECTOMY     Hx: of   LEFT HEART CATH AND CORONARY ANGIOGRAPHY N/A 11/04/2018   Procedure: LEFT HEART CATH AND CORONARY ANGIOGRAPHY;  Surgeon: Orpah CobbKadakia, Ajay, MD;  Location: [facility];  Service: Cardiovascular;  Laterality: N/A;   lumbar surgury     dr botero/ns; 3 level fusion per pt   POLYPECTOMY     s/p cervical disease x 2      Allergies  No Known Allergies  History of Present Illness    71 year old male with the above past medical history including non-obstructive CAD, PAD, chronic chest pain, dilation of ascending thoracic aorta, hypertension, hyperlipidemia, and tobacco use.   Previously followed by Dr. Algie CofferKadakia, now followed by Dr. Jens Somrenshaw.  Cardiac catheterization in December 2019  showed 25% pLAD stenosis. Abdominal ultrasound in February 2020 showed greater than 50% stenosis in the left common iliac and external iliac arteries, but no abdominal aortic aneurysm. CTA chest in September 2021 showed coronary artery calcifications and a 4 cm ascending thoracic aortic aneurysm.  Routine follow-up CT angio the chest/aorta in March 2023 showed high-grade ectasia of the ascending aorta measuring up to 3.9 cm, not significantly changed from prior scan in September 2021.  Repeat scan recommended in 1 year.  He was last seen in the office on 02/14/2022 ported increased episodes of chest pain at rest, relieved with nitroglycerin, generalized fatigue.  He denied exertional symptoms. Follow-up coronary CTA showed coronary calcium score of 1011, 86 percentile for age, sex-matched control, severe stenosis in the  mRCA (small non-dominant artery), mild pLAD stenosis, and minimal p-mLCx (<25%) stenosis. Anti-ischemic pharmacotherapy, as well as risk factor modification was recommended.  He presents today for follow-up accompanied by his wife for preoperative cardiac evaluation for upcoming dental extraction (15+ teeth to be  pulled, smoothing of residual bone, placement of at least 4 endosseous dental implants) with [facility] scheduled for 05/07/2022.  Since his last visit he has been stable from a cardiac standpoint. He does report ongoing fatigue, however, he remains active, and denies additional symptoms concerning for angina. He states that he usually wakes up to 8 times a night to urinate and this interrupts his sleep.  He is following with urology for this. Of note, he states that he is only having 2 teeth pulled for his upcoming dental extraction (surgical clearance request stated 15 teeth to be pulled). Other than his ongoing fatigue, he reports feeling well denies any additional concerns today.   Home Medications    Current Outpatient Medications  Medication Sig Dispense Refill   aspirin EC 81 MG EC tablet Take 1 tablet (81 mg total) by mouth daily.     DULoxetine (CYMBALTA) 60 MG capsule Take 1 capsule (60 mg total) by mouth daily. 90 capsule 3   ezetimibe (ZETIA) 10 MG tablet Take 1 tablet (10 mg total) by mouth daily. 90 tablet 3   HYDROcodone-acetaminophen (NORCO) 10-325 MG tablet Take 1 tablet by mouth every 6 (six) hours as needed for moderate pain. 40 tablet 0   nitroGLYCERIN (NITROSTAT) 0.4 MG SL tablet DISSOLVE 1 TABLET UNDER TONGUE AT ONSET OF CHEST PAIN. MAY REPEAT DOSE EVERY 5 MINUTES UP TO 3 DOSES AS NEEDED. IF PAIN NOT RELIEVED, CALL 911. Strength: 0.4 mg 25 tablet 3   NONFORMULARY OR COMPOUNDED ITEM Transdermal therapeutics ( meloxicam 0.5%, gabapentin 6%, lidocaine 2%, prilocaine 2%)     pregabalin (LYRICA) 150 MG capsule Take 150 mg daily at night. 90 capsule 0   ramipril (ALTACE) 5 MG capsule Take 1 capsule (5 mg total) by mouth daily. 90 capsule 3   solifenacin (VESICARE) 5 MG tablet Take 5 mg by mouth daily.     rosuvastatin (CRESTOR) 40 MG tablet Take 1 tablet (40 mg total) by mouth daily. 90 tablet 3   No current facility-administered medications for this visit.     Review of  Systems    He denies chest pain, palpitations, dyspnea, pnd, orthopnea, n, v, dizziness, syncope, edema, weight gain, or early satiety. All other systems reviewed and are otherwise negative except as noted above.   Physical Exam    VS:  BP 120/78   Pulse 87   Ht 5\' 9"  (1.753 m)   Wt 146 lb 9.6 oz (66.5 kg)   SpO2 98%   BMI 21.65 kg/m   GEN: Well nourished, well developed, in no acute distress. HEENT: normal. Neck: Supple, no JVD, carotid bruits, or masses. Cardiac: RRR, no murmurs, rubs, or gallops. No clubbing, cyanosis, edema.  Radials/DP/PT 2+ and equal bilaterally.  Respiratory:  Respirations regular and unlabored, clear to auscultation bilaterally. GI: Soft, nontender, nondistended, BS + x 4. MS: no deformity or atrophy. Skin: warm and dry, no rash. Neuro:  Strength and sensation are intact. Psych: Normal affect.  Accessory Clinical Findings    ECG personally reviewed by me today - NSR, 87 bpm - no acute changes.  Lab Results  Component Value Date   WBC 8.4 04/16/2022   HGB 12.3 (L) 04/16/2022   HCT 37.2 (L) 04/16/2022  MCV 87.6 04/16/2022   PLT 352.0 04/16/2022   Lab Results  Component Value Date   CREATININE 0.84 03/18/2022   BUN 15 03/18/2022   NA 137 03/18/2022   K 4.0 03/18/2022   CL 104 03/18/2022   CO2 26 03/18/2022   Lab Results  Component Value Date   ALT 15 02/01/2022   AST 22 02/01/2022   ALKPHOS 52 02/01/2022   BILITOT 0.5 02/01/2022   Lab Results  Component Value Date   CHOL 103 02/01/2022   HDL 49 02/01/2022   LDLCALC 43 02/01/2022   TRIG 54 02/01/2022   CHOLHDL 2.1 02/01/2022    Lab Results  Component Value Date   HGBA1C 5.4 01/24/2020    Assessment & Plan    1. Non-obstructive CAD/chronic chest pain/fatigue: Cath in 2019 showed 25% pLAD stenosis. Coronary CTA in March 2023 showed coronary calcium score of 1011, 86 percentile for age, sex-matched control, severe stenosis in the mRCA (small non-dominant artery), mild pLAD  stenosis, and minimal p-mLCx (<25%) stenosis. He continues to have ongoing fatigue, though he denies chest pain or dyspnea. No indication for additional ischemic evaluation at this time. Of note, he states to get up to urinate 8 times a night and is not sleeping well. This could be contributing to his fatigue. Will repeat echo. Discussed possible addition of Imdur, patient prefers to wait. Continue aspirin, ramipril, Crestor, and Zetia.  2. PAD: Abdominal ultrasound in February 2020 showed greater than 50% stenosis in the left common iliac and external iliac arteries, but no abdominal aortic aneurysm. Denies claudication. Continue aspirin, statin, Crestor. Tobacco cessation as below.   3. Dilation of ascending thoracic aorta: CT angio the chest/aorta in March 2023 showed high-grade ectasia of the ascending aorta measuring up to 3.9 cm, not significantly changed from prior scan in September 2021.  BP well controlled. Repeat scan recommended in 1 year.   4. Hypertension: BP well controlled. Continue current antihypertensive regimen.    5. Hyperlipidemia: LDL was 43 in March 2023.  Continue aspirin, Zetia, Crestor.   6. Tobacco use: He continues to smoke. He does not plan on quitting at this time. Will continue to advise full cessation.    7.  Preoperative cardiac evaluation: According to the Revised Cardiac Risk Index (RCRI), his Perioperative Risk of Major Cardiac Event is (%): 0.9. His Functional Capacity in METs is: 7.34 according to the [employer] Activity Status Index (DASI).  Therefore, based on ACC/AHA guidelines, patient would be at acceptable risk for the planned procedure without further cardiovascular testing. Patient does not require SBE prophylaxis for this procedure. I will route this recommendation to the requesting party via Epic fax function.    8. Disposition: Follow-up in 2-3 months.  Joylene Grapes, NP 04/22/2022, 9:29 AM

## 2022-04-22 NOTE — Patient Instructions (Signed)
Medication Instructions:  Your physician recommends that you continue on your current medications as directed. Please refer to the Current Medication list given to you today.   *If you need a refill on your cardiac medications before your next appointment, please call your pharmacy*   Lab Work: NONE ordered at this time of appointment   If you have labs (blood work) drawn today and your tests are completely normal, you will receive your results only by: [place] (if you have MyChart) OR A paper copy in the mail If you have any lab test that is abnormal or we need to change your treatment, we will call you to review the results.   Testing/Procedures: Your physician has requested that you have an echocardiogram. Echocardiography is a painless test that uses sound waves to create images of your heart. It provides your doctor with information about the size and shape of your heart and how well your heart's chambers and valves are working. This procedure takes approximately one hour. There are no restrictions for this procedure.    Follow-Up: At [facility], you and your health needs are our priority.  As part of our continuing mission to provide you with exceptional heart care, we have created designated Provider Care Teams.  These Care Teams include your primary Cardiologist (physician) and Advanced Practice Providers (APPs -  Physician Assistants and Nurse Practitioners) who all work together to provide you with the care you need, when you need it.  We recommend signing up for the patient portal called "MyChart".  Sign up information is provided on this After Visit Summary.  MyChart is used to connect with patients for Virtual Visits (Telemedicine).  Patients are able to view lab/test results, encounter notes, upcoming appointments, etc.  Non-urgent messages can be sent to your provider as well.   To learn more about what you can do with MyChart, go to [email or web address].     Your next appointment:   2-3 month(s)  The format for your next appointment:   In Person  Provider:   Kirk Ruths, MD  or Diona Browner, NP        Other Instructions   Important Information About Sugar

## 2022-05-31 ENCOUNTER — Telehealth: Payer: Self-pay | Admitting: Family Medicine

## 2022-05-31 NOTE — Telephone Encounter (Signed)
Left message for patient to call back and schedule Medicare Annual Wellness Visit (AWV).   Please offer to do virtually or by telephone.   Last AWV:04/10/2018  Please schedule at anytime with [facility] schedule 2  45 minute appointent  If any questions, please contact me at [phone]

## 2022-06-18 ENCOUNTER — Ambulatory Visit (HOSPITAL_COMMUNITY): Payer: Medicare HMO | Attending: Cardiology

## 2022-06-18 DIAGNOSIS — I2510 Atherosclerotic heart disease of native coronary artery without angina pectoris: Secondary | ICD-10-CM | POA: Diagnosis present

## 2022-06-18 DIAGNOSIS — R5383 Other fatigue: Secondary | ICD-10-CM | POA: Diagnosis not present

## 2022-06-18 LAB — ECHOCARDIOGRAM COMPLETE
Area-P 1/2: 4.06 cm2
S' Lateral: 2.7 cm

## 2022-07-22 NOTE — Progress Notes (Unsigned)
Office Visit    Patient Name: Donald Roberts Date of Encounter: 07/23/2022  Primary Care Provider:  Excell Seltzer, MD Primary Cardiologist:  Olga Millers, MD  Chief Complaint    71 year old male with a history of non-obstructive CAD, PAD, chronic chest pain, dilation of ascending thoracic aorta, hypertension, hyperlipidemia, and tobacco use presents for follow-up related to CAD.  Past Medical History    Past Medical History:  Diagnosis Date   Abdominal pain, generalized 07/18/2008   Abnormal gait    ALLERGIC RHINITIS 07/18/2008   Allergy    Back pain, chronic 10/01/2011   Carpal tunnel syndrome on left    Chronic low back pain 12/25/2012   COLONIC POLYPS, HX OF 07/18/2008   CORONARY ARTERY DISEASE 07/18/2008   Degenerative arthritis of right knee 10/01/2011   DIVERTICULOSIS, COLON 07/18/2008   Gross hematuria 07/18/2008   Headache(784.0)    Hx: of Migraines as a child   Heart murmur    HYPERLIPIDEMIA 07/18/2008   Hypertension    Idiopathic progressive neuropathy    PVD (peripheral vascular disease) (HCC)    TOBACCO ABUSE 04/06/2009   Past Surgical History:  Procedure Laterality Date   ANTERIOR CERVICAL DECOMP/DISCECTOMY FUSION N/A 09/10/2013   Procedure: ANTERIOR CERVICAL DECOMPRESSION/DISCECTOMY FUSION CERVICAL FOUR-FIVE,CERVICAL FIVE-SIX WITH PLATING AND BONE GRAFT;  Surgeon: Karn Cassis, MD;  Location: [facility];  Service: Neurosurgery;  Laterality: N/A;   ANTERIOR CERVICAL DECOMP/DISCECTOMY FUSION N/A 03/14/2015   Procedure: ANTERIOR CERVICAL DECOMPRESSION/DISCECTOMY FUSION  CERVICAL TWO-THREE;  Surgeon: Hilda Lias, MD;  Location: [facility];  Service: Neurosurgery;  Laterality: N/A;   ANTERIOR CERVICAL DECOMP/DISCECTOMY FUSION N/A 10/03/2020   Procedure: Anterior Cervial Discectomy and Fusion Cervical three and cervical four;  Surgeon: Julio Sicks, MD;  Location: [facility];  Service: Neurosurgery;  Laterality: N/A;  3C   CARDIAC CATHETERIZATION     COLONOSCOPY      COLONOSCOPY W/ BIOPSIES AND POLYPECTOMY     Hx: of   LEFT HEART CATH AND CORONARY ANGIOGRAPHY N/A 11/04/2018   Procedure: LEFT HEART CATH AND CORONARY ANGIOGRAPHY;  Surgeon: Orpah Cobb, MD;  Location: [facility];  Service: Cardiovascular;  Laterality: N/A;   lumbar surgury     dr botero/ns; 3 level fusion per pt   POLYPECTOMY     s/p cervical disease x 2      Allergies  No Known Allergies  History of Present Illness    71 year old male with the above past medical history including non-obstructive CAD, PAD, chronic chest pain, dilation of ascending thoracic aorta, hypertension, hyperlipidemia, and tobacco use.   Previously followed by Dr. Algie Coffer, now followed by Dr. Jens Som.  Cardiac catheterization in December 2019 showed 25% pLAD stenosis. Abdominal ultrasound in February 2020 showed greater than 50% stenosis in the left common iliac and external iliac arteries, but no abdominal aortic aneurysm. CTA chest in September 2021 showed coronary artery calcifications and a 4 cm ascending thoracic aortic aneurysm.  Routine follow-up CT angio the chest/aorta in March 2023 showed high-grade ectasia of the ascending aorta measuring up to 3.9 cm, not significantly changed from prior scan in September 2021.  Repeat scan recommended in 1 year.  He was seen in the office in March 2023 and reported increased episodes of chest pain at rest, relieved with nitroglycerin, generalized fatigue.  He denied exertional symptoms. Follow-up coronary CTA showed coronary calcium score of 1011, 86 percentile for age, sex-matched control, severe stenosis in the mRCA (small non-dominant artery), mild pLAD  stenosis, and minimal p-mLCx (<25%) stenosis. Anti-ischemic pharmacotherapy, as well as risk factor modification was recommended.  He was last seen in the office on 04/22/2022 and was stable from a cardiac standpoint.  He did report ongoing fatigue, however, he denied any additional symptoms concerning for angina.   He declined a trial of Imdur.  He was cleared for dental procedure. Repeat echocardiogram showed EF 60 to 65%, mild concentric LVH, normal RV systolic function, no significant valvular abnormalities. Of note, he reported interrupted sleep pattern due to nocturia.    He presents today for follow-up accompanied by his wife. Since his last visit he has done well from a cardiac standpoint. He reports low energy most days, however, he attributes this to sleeping 1 to 2 hours a night due to severe nocturia.  He states he has seen 3 different urologist for this in the past, however, it continues to be a problem.  He denies any symptoms concerning for angina.  He remains active.  He continues to smoke.  Overall, he reports feeling well denies any new concerns today.  Home Medications    Current Outpatient Medications  Medication Sig Dispense Refill   aspirin EC 81 MG EC tablet Take 1 tablet (81 mg total) by mouth daily.     DULoxetine (CYMBALTA) 60 MG capsule Take 1 capsule (60 mg total) by mouth daily. 90 capsule 3   ezetimibe (ZETIA) 10 MG tablet Take 1 tablet (10 mg total) by mouth daily. 90 tablet 3   HYDROcodone-acetaminophen (NORCO) 10-325 MG tablet Take 1 tablet by mouth every 6 (six) hours as needed for moderate pain. 40 tablet 0   nitroGLYCERIN (NITROSTAT) 0.4 MG SL tablet DISSOLVE 1 TABLET UNDER TONGUE AT ONSET OF CHEST PAIN. MAY REPEAT DOSE EVERY 5 MINUTES UP TO 3 DOSES AS NEEDED. IF PAIN NOT RELIEVED, CALL 911. Strength: 0.4 mg 25 tablet 3   NONFORMULARY OR COMPOUNDED ITEM Transdermal therapeutics ( meloxicam 0.5%, gabapentin 6%, lidocaine 2%, prilocaine 2%)     pregabalin (LYRICA) 150 MG capsule Take 150 mg daily at night. 90 capsule 0   ramipril (ALTACE) 5 MG capsule Take 1 capsule (5 mg total) by mouth daily. 90 capsule 3   solifenacin (VESICARE) 5 MG tablet Take 5 mg by mouth daily.     rosuvastatin (CRESTOR) 40 MG tablet Take 1 tablet (40 mg total) by mouth daily. 90 tablet 3   No  current facility-administered medications for this visit.     Review of Systems    He denies chest pain, palpitations, dyspnea, pnd, orthopnea, n, v, dizziness, syncope, edema, weight gain, or early satiety. All other systems reviewed and are otherwise negative except as noted above.   Physical Exam    VS:  BP 138/72   Pulse 73   Ht 5\' 10"  (1.778 m)   Wt 160 lb (72.6 kg)   SpO2 98%   BMI 22.96 kg/m  GEN: Well nourished, well developed, in no acute distress. HEENT: normal. Neck: Supple, no JVD, carotid bruits, or masses. Cardiac: RRR, no murmurs, rubs, or gallops. No clubbing, cyanosis, edema.  Radials/DP/PT 2+ and equal bilaterally.  Respiratory:  Respirations regular and unlabored, clear to auscultation bilaterally. GI: Soft, nontender, nondistended, BS + x 4. MS: no deformity or atrophy. Skin: warm and dry, no rash. Neuro:  Strength and sensation are intact. Psych: Normal affect.  Accessory Clinical Findings    ECG personally reviewed by me today - No EKG in office today.    Lab Results  Component Value Date   WBC 8.4 04/16/2022   HGB 12.3 (L) 04/16/2022   HCT 37.2 (L) 04/16/2022   MCV 87.6 04/16/2022   PLT 352.0 04/16/2022   Lab Results  Component Value Date   CREATININE 0.84 03/18/2022   BUN 15 03/18/2022   NA 137 03/18/2022   K 4.0 03/18/2022   CL 104 03/18/2022   CO2 26 03/18/2022   Lab Results  Component Value Date   ALT 15 02/01/2022   AST 22 02/01/2022   ALKPHOS 52 02/01/2022   BILITOT 0.5 02/01/2022   Lab Results  Component Value Date   CHOL 103 02/01/2022   HDL 49 02/01/2022   LDLCALC 43 02/01/2022   TRIG 54 02/01/2022   CHOLHDL 2.1 02/01/2022    Lab Results  Component Value Date   HGBA1C 5.4 01/24/2020    Assessment & Plan    1. Non-obstructive CAD/chronic chest pain/fatigue: Cath in 2019 showed 25% pLAD stenosis. Coronary CTA in March 2023 showed coronary calcium score of 1011, 86 percentile for age, sex-matched control, severe  stenosis in the mRCA (small non-dominant artery), mild pLAD stenosis, and minimal p-mLCx (<25%) stenosis.  Repeat echo was stable.  Stable with no anginal symptoms. No indication for further ischemic evaluation.  He has recurrent angina, could consider addition of long-acting nitrate/vs amlodipine. Continue aspirin, ramipril, Crestor, and Zetia.   2. PAD: Abdominal ultrasound in February 2020 showed greater than 50% stenosis in the left common iliac and external iliac arteries, but no abdominal aortic aneurysm. Denies claudication. Continue aspirin, statin, Crestor. Tobacco cessation as below.   3. Dilation of ascending thoracic aorta: CT angio the chest/aorta in March 2023 showed high-grade ectasia of the ascending aorta measuring up to 3.9 cm, not significantly changed from prior scan in September 2021.  BP well controlled. Repeat scan recommended in March 2023.   4. Hypertension: BP well controlled. Continue current antihypertensive regimen.    5. Hyperlipidemia: LDL was 43 in March 2023.  Continue aspirin, Zetia, Crestor.   6. Tobacco use: He continues to smoke. Will continue to encourage full cessation.    7. Disposition: Follow-up in 6 months with Dr. Jens Som.      Joylene Grapes, NP 07/23/2022, 8:08 AM

## 2022-07-23 ENCOUNTER — Ambulatory Visit: Payer: Medicare HMO | Admitting: Nurse Practitioner

## 2022-07-23 ENCOUNTER — Encounter: Payer: Self-pay | Admitting: Nurse Practitioner

## 2022-07-23 VITALS — BP 138/72 | HR 73 | Ht 70.0 in | Wt 160.0 lb

## 2022-07-23 DIAGNOSIS — R5383 Other fatigue: Secondary | ICD-10-CM | POA: Diagnosis not present

## 2022-07-23 DIAGNOSIS — I739 Peripheral vascular disease, unspecified: Secondary | ICD-10-CM | POA: Diagnosis not present

## 2022-07-23 DIAGNOSIS — I77810 Thoracic aortic ectasia: Secondary | ICD-10-CM

## 2022-07-23 DIAGNOSIS — E785 Hyperlipidemia, unspecified: Secondary | ICD-10-CM

## 2022-07-23 DIAGNOSIS — R079 Chest pain, unspecified: Secondary | ICD-10-CM

## 2022-07-23 DIAGNOSIS — I2510 Atherosclerotic heart disease of native coronary artery without angina pectoris: Secondary | ICD-10-CM | POA: Diagnosis not present

## 2022-07-23 DIAGNOSIS — I10 Essential (primary) hypertension: Secondary | ICD-10-CM

## 2022-07-23 DIAGNOSIS — Z720 Tobacco use: Secondary | ICD-10-CM

## 2022-07-23 NOTE — Patient Instructions (Signed)
Medication Instructions:  ?Your physician recommends that you continue on your current medications as directed. Please refer to the Current Medication list given to you today.  ? ? ?*If you need a refill on your cardiac medications before your next appointment, please call your pharmacy* ? ? ?Lab Work: ?NONE ordered at this time of appointment  ? ? ?If you have labs (blood work) drawn today and your tests are completely normal, you will receive your results only by: ?MyChart Message (if you have MyChart) OR ?A paper copy in the mail ?If you have any lab test that is abnormal or we need to change your treatment, we will call you to review the results. ? ? ?Testing/Procedures: ?NONE ordered at this time of appointment  ? ? ? ?Follow-Up: ?At [facility], you and your health needs are our priority.  As part of our continuing mission to provide you with exceptional heart care, we have created designated Provider Care Teams.  These Care Teams include your primary Cardiologist (physician) and Advanced Practice Providers (APPs -  Physician Assistants and Nurse Practitioners) who all work together to provide you with the care you need, when you need it. ? ?We recommend signing up for the patient portal called "MyChart".  Sign up information is provided on this After Visit Summary.  MyChart is used to connect with patients for Virtual Visits (Telemedicine).  Patients are able to view lab/test results, encounter notes, upcoming appointments, etc.  Non-urgent messages can be sent to your provider as well.   ?To learn more about what you can do with MyChart, go to [email or web address].   ? ?Your next appointment:   ? 6 months ? ?The format for your next appointment:   ?In Person ? ?Provider:   ?Brian Crenshaw, MD   ? ? ?Other Instructions ? ? ?Important Information About Sugar ? ? ? ? ? ? ?

## 2022-08-30 ENCOUNTER — Other Ambulatory Visit: Payer: Self-pay | Admitting: Student

## 2022-08-30 DIAGNOSIS — M5416 Radiculopathy, lumbar region: Secondary | ICD-10-CM

## 2022-09-12 ENCOUNTER — Ambulatory Visit
Admission: RE | Admit: 2022-09-12 | Discharge: 2022-09-12 | Disposition: A | Discharge status: Home / Self Care | Payer: Medicare HMO | Source: Ambulatory Visit | Attending: Student | Admitting: Student

## 2022-09-12 DIAGNOSIS — M5416 Radiculopathy, lumbar region: Secondary | ICD-10-CM

## 2022-09-12 MED ORDER — GADOPICLENOL 0.5 MMOL/ML IV SOLN
8.0000 mL | Freq: Once | INTRAVENOUS | Status: AC | PRN
  Administered 2022-09-12: 8 mL via INTRAVENOUS

## 2022-10-07 ENCOUNTER — Telehealth: Payer: Self-pay

## 2022-10-07 NOTE — Telephone Encounter (Signed)
Contacted patient on preferred number listed in notes for scheduled AWV. Patient stated I refuse to due this type of visit.

## 2022-11-28 ENCOUNTER — Telehealth: Payer: Self-pay | Admitting: Cardiology

## 2022-11-28 NOTE — Telephone Encounter (Signed)
Spoke with wife who stated patient has no energy, which has been getting increasingly worse over the past few months. He is constantly getting up to void throughout the day and night with dribbling of urine. Wife not sure if patient has been taking prescribed vesicare. She will check on that. Recommended that patient contact urologist for ongoing issue with bladder/urination. Wife concerned that lack of energy could be heart-related or low Fe levels. Please advise.

## 2022-11-28 NOTE — Telephone Encounter (Signed)
Pt states he is having issues with energy. She states she has had this issue before and they have not gotten to a solution yet. She states it is not normal for a person to not have enough energy to go to the bathroom. She states she is concerend about the bp medications he is on and they might be draining him. Please advise.

## 2022-12-03 NOTE — Telephone Encounter (Signed)
Left message for pt to call.

## 2023-01-13 ENCOUNTER — Telehealth: Payer: Self-pay | Admitting: Family Medicine

## 2023-01-13 ENCOUNTER — Telehealth: Payer: Self-pay | Admitting: Cardiology

## 2023-01-13 NOTE — H&P (View-Only) (Signed)
Cardiology Office Note:    Date:  01/14/2023   ID:  Donald Roberts, DOB 05/05/1951, MRN [id number]  PCP:  Jinny Sanders, MD  Cardiologist:  Kirk Ruths, MD  Electrophysiologist:  None   Referring MD: Jinny Sanders, MD   Chief Complaint: fatigue  History of Present Illness:    Donald Roberts is a 72 y.o. male with a history of chest pain with severe stenosis of mid RCA noted on coronary CTA in 03/2022 (medical therapy recommended given size of vessel) on cardiac catheterization in 2019, ectatic ascending aorta, PAD with stenosis of bilateral external iliac arteries and left common iliac artery noted on abdominal ultrasound in 01/2020, hypertension, hyperlipidemia, chronic back pain, and tobacco abuse who is followed by Dr. Stanford Breed and presents today for evaluation of fatigue.  Patient was previously followed by Dr. Doylene Canard and now follows with Dr. Stanford Breed. Cardiac catheterization in 11/2018 showed mild non-obstructive CAD with 25% stenosis of ostial to proximal LAD but otherwise normal coronaries. He was referred to Dr. Stanford Breed in 12/2019 for further evaluation of CAD after a chest CT in 08/2019 showed aortic atherosclerosis and coronary calcifications. He reported some dyspnea on exertion at that time as well as one episode of chest pain. However, he denied any exertional chest pain despite being very active. Chest pain was felt to be somewhat atypical and given only mild disease on cardiac catheterization in 11/2018, no additional evaluation was recommended. AAA duplex was ordered for further evaluation of a bruit noted on exam and showed no evidence of AAA but did show >50% stenosis of bilateral external iliac arteries and the left common iliac artery. Chest CTA in 01/2022 for routine follow-up of known dilated ascending aorta showed high-grade ectasia of the ascending aorta measuring up to 3.9cm that was relatively stable from prior imaging in 2021. Also showed moderate bilateral centrilobular  cystic emphysematous changes.   Patient was seen by Diona Browner, NP, in 01/2022 and reported 2-3 episodes of resting chest pain with radiation to his left arm over the past 2 months that resolved with Nitroglycerin. He denied any exertional pain. He also reported increased fatigue. Coronary CTA was ordered and showed a coronary calcium score of 1,1011 (86th percentile for age and sex) with severe stenosis (>70%) in the mid RCA but this was a small non-dominant artery as well as mild stenosis (25%) of proximal LAD and minimal disease (<25%) of proximal to mid LCX. Medical therapy was recommended for further evaluation. Echo was then ordered and showed LVEF of 60-65% with no regional wall motion abnormalities, mild LVH, and normal diastolic parameters as well as aortic valve sclerosis but no aortic stenosis. He was last seen by Diona Browner, NP, in 07/2022 at which time he continued to remote significant low energy most days but he attributed this to only sleeping 1-2 hours at night due to severe nocturia. He reported having been seen by 3 different Urologist in the past but this continues to be an issue. He denied any recurrent chest pain at that time.   Patient presents today for further evaluation of significant fatigue.  He is here with his wife. He reports profound fatigue to the point where he states he can barely move off the couch. His fatigue started about 1 year ago but has gotten progressively worse. He states it is significantly worse than it was at last visit in 07/2022 and his wife confirms this as well. He does have severe nocturia and only is  able to sleep 1-2 hours at a time at night. However, this is not new and despite his nocturia being unchanged, his fatigue has worsened. He reports occasional chest pain as well that resolves after 1-2 doses of sublingual Nitro. In addition, he reports some dyspnea on exertion. He denies any shortness of breath. He states he has slept in a recliner for years due  to comfort and it being easier to breathe this way. He denies any PND or edema. He occasionally has some dizziness if he stands to quickly and actually fell a week or so ago but did not hit is head. No syncope.  Of note, patient looked very uncomfortable in the office and was moving around a lot. He states this is due to his chronic back pain for which he takes Norco 10-354m every 6 hours.  Past Medical History:  Diagnosis Date   Abdominal pain, generalized 07/18/2008   Abnormal gait    ALLERGIC RHINITIS 07/18/2008   Allergy    Back pain, chronic 10/01/2011   Carpal tunnel syndrome on left    Chronic low back pain 12/25/2012   COLONIC POLYPS, HX OF 07/18/2008   CORONARY ARTERY DISEASE 07/18/2008   Degenerative arthritis of right knee 10/01/2011   DIVERTICULOSIS, COLON 07/18/2008   Gross hematuria 07/18/2008   Headache(784.0)    Hx: of Migraines as a child   Heart murmur    HYPERLIPIDEMIA 07/18/2008   Hypertension    Idiopathic progressive neuropathy    PVD (peripheral vascular disease) ([facility]    TOBACCO ABUSE 04/06/2009    Past Surgical History:  Procedure Laterality Date   ANTERIOR CERVICAL DECOMP/DISCECTOMY FUSION N/A 09/10/2013   Procedure: ANTERIOR CERVICAL DECOMPRESSION/DISCECTOMY FUSION CERVICAL FOUR-FIVE,CERVICAL FIVE-SIX WITH PLATING AND BONE GRAFT;  Surgeon: EFloyce Stakes MD;  Location: [facility];  Service: Neurosurgery;  Laterality: N/A;   ANTERIOR CERVICAL DECOMP/DISCECTOMY FUSION N/A 03/14/2015   Procedure: ANTERIOR CERVICAL DECOMPRESSION/DISCECTOMY FUSION  CERVICAL TWO-THREE;  Surgeon: ELeeroy Cha MD;  [facility];  Service: Neurosurgery;  Laterality: N/A;   ANTERIOR CERVICAL DECOMP/DISCECTOMY FUSION N/A 10/03/2020   Procedure: Anterior Cervial Discectomy and Fusion Cervical three and cervical four;  Surgeon: PEarnie Larsson MD;  [place] [facility]  Service: Neurosurgery;  Laterality: N/A;  3C   CARDIAC CATHETERIZATION     COLONOSCOPY     COLONOSCOPY W/  BIOPSIES AND POLYPECTOMY     Hx: of   LEFT HEART CATH AND CORONARY ANGIOGRAPHY N/A 11/04/2018   Procedure: LEFT HEART CATH AND CORONARY ANGIOGRAPHY;  Surgeon: KDixie Dials MD;  [facility];  Service: Cardiovascular;  Laterality: N/A;   lumbar surgury     dr botero/ns; 3 level fusion per pt   POLYPECTOMY     s/p cervical disease x 2      Current Medications: Current Meds  Medication Sig   ezetimibe (ZETIA) 10 MG tablet Take 1 tablet (10 mg total) by mouth daily. (Patient not taking: Reported on 01/14/2023)   HYDROcodone-acetaminophen (NORCO) 10-325 MG tablet Take 1 tablet by mouth every 6 (six) hours as needed for moderate pain. (Patient taking differently: Take 1 tablet by mouth every 6 (six) hours.)   nitroGLYCERIN (NITROSTAT) 0.4 MG SL tablet DISSOLVE 1 TABLET UNDER TONGUE AT ONSET OF CHEST PAIN. MAY REPEAT DOSE EVERY 5 MINUTES UP TO 3 DOSES AS NEEDED. IF PAIN NOT RELIEVED, CALL 911. Strength: 0.4 mg   ramipril (ALTACE) 5 MG capsule Take 1 capsule (5 mg total) by mouth daily.   [  DISCONTINUED] aspirin EC 81 MG EC tablet Take 1 tablet (81 mg total) by mouth daily. (Patient not taking: Reported on 01/14/2023)   [DISCONTINUED] DULoxetine (CYMBALTA) 60 MG capsule Take 1 capsule (60 mg total) by mouth daily.   [DISCONTINUED] NONFORMULARY OR COMPOUNDED ITEM Transdermal therapeutics ( meloxicam 0.5%, gabapentin 6%, lidocaine 2%, prilocaine 2%)   [DISCONTINUED] pregabalin (LYRICA) 150 MG capsule Take 150 mg daily at night. (Patient not taking: Reported on 01/14/2023)   [DISCONTINUED] solifenacin (VESICARE) 5 MG tablet Take 5 mg by mouth daily.     Allergies:   Patient has no known allergies.   Social History   Socioeconomic History   Marital status: Married    Spouse name: Hassan Rowan   Number of children: 1   Years of education: Not on file   Highest education level: High school graduate  Occupational History   Occupation: Chief Strategy Officer and Visual merchandiser    Comment: semi retired   Tobacco Use   Smoking status: Every Day    Packs/day: 0.50    Years: 40.00    Total pack years: 20.00    Types: Cigarettes   Smokeless tobacco: Never  Vaping Use   Vaping Use: Never used  Substance and Sexual Activity   Alcohol use: Yes    Comment: occasional   Drug use: No   Sexual activity: Not Currently  Other Topics Concern   Not on file  Social History Narrative   Married, 1 daughter lives close by.   Spends time at [employer] in [place].   Social Determinants of Health   Financial Resource Strain: Not on file  Food Insecurity: Not on file  Transportation Needs: Not on file  Physical Activity: Not on file  Stress: Not on file  Social Connections: Not on file     Family History: The patient's family history includes Heart attack in his father; Heart disease in his brother, mother, and another family member; Parkinsonism in his paternal uncle. There is no history of Colon cancer, Colon polyps, Esophageal cancer, Rectal cancer, or Stomach cancer.  ROS:   Please see the history of present illness.     EKGs/Labs/Other Studies Reviewed:    The following studies were reviewed today:  Left Cardiac Catheterization 11/04/2018: Ost LAD to Prox LAD lesion is 25% stenosed.   Diagnostic Dominance: Left  _______________  AAA Duplex 01/04/2020: Summary:  Abdominal Aorta:  No evidence of an abdominal aortic aneurysm was visualized.   The largest aortic measurement is 2.4 cm.  No previous exam available for comparison.  Stenosis: +--------------------+-------------+  Location            Stenosis       +--------------------+-------------+  Right Common Iliac  <50% stenosis  +--------------------+-------------+  Left Common Iliac   >50% stenosis  +--------------------+-------------+  Right External Iliac<50% stenosis  +--------------------+-------------+  Left External Iliac >50% stenosis  +--------------------+-------------+   Atherosclerosis in the  aorta and iliac arteries.  >50% stenosis in the left common iliac and external iliac arteries.  _______________  Chest CTA 02/07/2022: Impressions: 1. There is high-grade ectasia of the ascending aorta measures up to 3.9 cm, not significantly changed from 08/17/2020 CT when measured in a similar manner. Consider attention on annual follow-up noncontrast lung cancer screening imaging. 2. Moderate bilateral centrilobular cystic emphysematous changes.  _______________  Coronary CTA 03/21/2022: Impressions: 1. Coronary calcium score of 1011. This was 86th percentile for age and sex matched control. 2. Normal coronary origin with right dominance. 3. Severe stenosis in the mid  RCA (>70%)-small non dominant artery. 4. Mild proximal LAD stenosis (25%) 5. Minimal proximal and mid LCx disease (<25%). 6. Consider symptom-guided anti-ischemic pharmacotherapy as well as risk factor modification per guideline directed care _______________  Echocardiogram 06/18/2022: Impressions: 1. Left ventricular ejection fraction, by estimation, is 60 to 65%. The  left ventricle has normal function. The left ventricle has no regional  wall motion abnormalities. There is mild concentric left ventricular  hypertrophy. Left ventricular diastolic  parameters were normal. The average left ventricular global longitudinal  strain is -25.6 %. The global longitudinal strain is normal.   2. Right ventricular systolic function is normal. The right ventricular  size is normal.   3. The mitral valve is grossly normal. Trivial mitral valve  regurgitation.   4. The aortic valve was not well visualized. There is moderate  calcification of the aortic valve. There is moderate thickening of the  aortic valve. Aortic valve regurgitation is not visualized. Aortic valve  sclerosis/calcification is present, without  any evidence of aortic stenosis.   5. The inferior vena cava is normal in size with greater than 50%   respiratory variability, suggesting right atrial pressure of 3 mmHg.   Comparison(s): No prior Echocardiogram.    EKG:  EKG ordered today. EKG personally reviewed and demonstrates normal sinus rhythm, rate 78 bpm, with some sinus arrhythmia and some mild ST depression in inferior leads but no significant changes compared to prior tracing in 01/2022.   Recent Labs: 02/01/2022: ALT 15 03/18/2022: BUN 15; Creatinine, Ser 0.84; Potassium 4.0; Sodium 137 04/16/2022: Hemoglobin 12.3; Platelets 352.0  Recent Lipid Panel    Component Value Date/Time   CHOL 103 02/01/2022 0949   CHOL 169 12/13/2021 0900   TRIG 54 02/01/2022 0949   HDL 49 02/01/2022 0949   HDL 42 12/13/2021 0900   CHOLHDL 2.1 02/01/2022 0949   VLDL 11 02/01/2022 0949   LDLCALC 43 02/01/2022 0949   LDLCALC 102 (H) 12/13/2021 0900    Physical Exam:    Vital Signs: BP 118/78   Pulse 78   Ht 5' 10"$  (1.778 m)   Wt 164 lb (74.4 kg)   SpO2 97%   BMI 23.53 kg/m     Wt Readings from Last 3 Encounters:  01/14/23 164 lb (74.4 kg)  07/23/22 160 lb (72.6 kg)  04/22/22 146 lb 9.6 oz (66.5 kg)     General: 72 y.o. thin Caucasian male in no acute distress. HEENT: Normocephalic and atraumatic. Sclera clear.  Neck: Supple.  No JVD. Heart: RRR. Distinct S1 and S2. No murmurs, gallops, or rubs. Radial pulses 2+ and equal bilaterally. Lungs: No increased work of breathing. Clear to ausculation bilaterally. No wheezes, rhonchi, or rales.  Abdomen: Soft, non-distended, and non-tender to palpation.  Extremities: No lower extremity edema.    Skin: Warm and dry. Neuro: Alert and oriented x3. No focal deficits. Psych: Normal affect. Responds appropriately.   Assessment:    1. Fatigue, unspecified type   2. Chest pain of uncertain etiology   3. Dyspnea on exertion   4. Coronary artery disease involving native coronary artery of native heart with angina pectoris ([place])   5. Ascending aorta dilatation (H[facility])   6. PAD (peripheral  artery disease) ([place])   7. Primary hypertension   8. Pure hypercholesterolemia   9. Preprocedural cardiovascular examination     Plan:    Fatigue Chest Pain Dyspnea on Exertion Patient biggest complaint today is profound fatigue. He also reports occasional chest pain that resolves after  1-2 doses of sublingual Nitroglycerin and dyspnea on exertion.  - EKG today shows no acute ischemic changes compared to prior tracings. - Worried that fatigue and dyspnea on exertion may be anginal equivalents (although some of dyspnea may be due to deconditioning). He has known CAD with severe stenosis of mid RCA noted on coronary CTA in 03/2022 that was treated medically due to to size of vessel. Echo in 06/2022 showed normal LV function. Given progression of symptoms and profound fatigue, I think he warrants a left cardiac catheterization. Discussed with DOD (Dr. Ellyn Hack) who agrees.  - Will check TSH, CBC, and Vitamin D for further evaluation of fatigue. Will also check BMET as part of pre-procedural labs.   The patient understands that risks include but are not limited to stroke (1 in 1000), death (1 in 8), kidney failure [usually temporary] (1 in 500), bleeding (1 in 200), allergic reaction [possibly serious] (1 in 200), and agrees to proceed.   CAD Coronary CTA in 03/2022 showed a coronary calcium score of 1,1011 (86th percentile for age and sex) with severe stenosis (>70%) in the mid RCA but this was a small non-dominant artery as well as mild stenosis (25%) of proximal LAD and minimal disease (<25%) of proximal to mid LCX. Medical therapy was recommended for further evaluation. - Patient reports occasional chest pain that improves with 1-2 dose of sublingual Nitroglycerin as above. - Continue aspirin and statin.  - Plans is for left heart catheterization as above.   Of note, after patient had already left office I realized it looks like Aspirin has been removed from his medications list. Given known  CAD and PAD, he should be on Aspirin 32m daily. Will advise patient to restart this when we call with lab results.   Ectatic Ascending Aorta Chest CTA in 01/2022 showed a dilated ascending aorta showed high-grade ectasia of the ascending aorta measuring up to 3.9cm that was relatively stable from prior imaging in 2021. - Did not have time to discuss today but will need repeat chest CTA for routine monitoring of this. Can discuss this at follow-up visit.  PAD AAA duplex in 01/2020 showed no evidence of AAA but did show >50% stenosis of bilateral external iliac arteries and the left common iliac artery.  - Continue aspirin and statin.  Hypertension BP well controlled.  - Continue Ramipril 523mdaily.  - Of note, patient described some dizziness with standing consistent with orthostasis. Recommend changing positions slowly, staying well hydrated, and using compressions stockings.   Hyperlipidemia Lipid panel in 01/2022: Total Cholesterol 103, Triglycerides 54, HDL 49, LDL 43. LDL goal <70 given CAD. - Continue Crestor 4037maily.  Disposition: Follow up with me in 2 weeks after cardiac catheterization.   Time: Spent 60 minutes on day of encounter (pre charting, reviewing labs, seeing patient, talking with Dr. HarEllyn Hacknd documenting in [place]    Medication Adjustments/Labs and Tests Ordered: Current medicines are reviewed at length with the patient today.  Concerns regarding medicines are outlined above.  Orders Placed This Encounter  Procedures   Basic metabolic panel   CBC   TSH   VITAMIN D 25 Hydroxy (Vit-D Deficiency, Fractures)   EKG 12-Lead   No orders of the defined types were placed in this encounter.   Patient Instructions  Medication Instructions:   Your physician recommends that you continue on your current medications as directed. Please refer to the Current Medication list given to you today.  *If you need a refill  on your cardiac medications before your next  appointment, please call your pharmacy*  Lab Work: Your physician recommends that you return for lab work in [place]: BMP CBC TSH Vitamin D If you have labs (blood work) drawn today and your tests are completely normal, you will receive your results only by: [place] (if you have MyChart) OR A paper copy in the mail If you have any lab test that is abnormal or we need to change your treatment, we will call you to review the results.  Testing/Procedures: Your physician assistant has requested that you have a cardiac catheterization. Cardiac catheterization is used to diagnose and/or treat various heart conditions. Doctors may recommend this procedure for a number of different reasons. The most common reason is to evaluate chest pain. Chest pain can be a symptom of coronary artery disease (CAD), and cardiac catheterization can show whether plaque is narrowing or blocking your heart's arteries. This procedure is also used to evaluate the valves, as well as measure the blood flow and oxygen levels in different parts of your heart. For further information please visit [email or web address]. Please follow instruction sheet, as given.  Scheduled for Wednesday 01/15/22 at 10:00 AM  Follow-Up: At [facility], you and your health needs are our priority.  As part of our continuing mission to provide you with exceptional heart care, we have created designated Provider Care Teams.  These Care Teams include your primary Cardiologist (physician) and Advanced Practice Providers (APPs -  Physician Assistants and Nurse Practitioners) who all work together to provide you with the care you need, when you need it.  We recommend signing up for the patient portal called "MyChart".  Sign up information is provided on this After Visit Summary.  MyChart is used to connect with patients for Virtual Visits (Telemedicine).  Patients are able to view lab/test results, encounter notes, upcoming appointments, etc.   Non-urgent messages can be sent to your provider as well.   To learn more about what you can do with MyChart, go to [email or web address].    Your next appointment:   2-3 week(s) after heart cath   Provider:   Sande Rives, PA-C        Other Instructions        Cardiac/Peripheral Catheterization   You are scheduled for a Cardiac Catheterization on Wednesday, February 14 with Dr. Glenetta Hew.  1. Please arrive at the Main Entrance A at [facility]: [place], [place] [address] on February 14 at 8:00 AM (This time is two hours before your procedure to ensure your preparation). Free valet parking service is available. You will check in at ADMITTING. The support person will be asked to wait in the waiting room.  It is OK to have someone drop you off and come back when you are ready to be discharged.        Special note: Every effort is made to have your procedure done on time. Please understand that emergencies sometimes delay scheduled procedures.   . 2. Diet: Do not eat solid foods after midnight.  You may have clear liquids until 5 AM the day of the procedure.  3. Labs: You will need to have blood drawn on Tuesday, February 13 at [facility]  Open: 8am - 5pm (Lunch 12:30 - 1:30)   Phone: [phone]. You do not need to be fasting.  4. Medication instructions in preparation for your procedure:   Contrast Allergy: No  Stop  taking, Ramipril (Altace) Wednesday, February 14,  On the morning of your procedure, take Aspirin 81 mg and any morning medicines NOT listed above.  You may use sips of water.  5. Plan to go home the same day, you will only stay overnight if medically necessary. 6. You MUST have a responsible adult to drive you home. 7. An adult MUST be with you the first 24 hours after you arrive home. 8. Bring a current list of your medications, and the last time and date medication taken. 9. Bring ID and  current insurance cards. 10.Please wear clothes that are easy to get on and off and wear slip-on shoes.  Thank you for allowing [place] to care for you!   -- [facility] In[facility] Cardiovascular services        Signed, Eppie Gibson  01/14/2023 1:25 PM    [facility]

## 2023-01-13 NOTE — Telephone Encounter (Signed)
Patient will call back to schedule AWV with [facility]  

## 2023-01-13 NOTE — Telephone Encounter (Signed)
Patient states that he has had 2 heart catheterizations. He is just so tired all the time and cannot think of anything else that it could be. He barely has energy to go to the bathroom. He reports shortness of breath- over the last 4-57month. Getting up to the bathroom to brush teeth makes him out of breath. No chest pain, no dizziness, no nausea, no sweating. He just really wants to be seen by someone to discuss being so tired and what should be done  Scheduled appointment with CSande Rives PA-C for 01/14/23 at 1005

## 2023-01-13 NOTE — Telephone Encounter (Signed)
Patient would like a call back to discuss having a catherization.

## 2023-01-13 NOTE — Progress Notes (Unsigned)
Cardiology Office Note:    Date:  01/14/2023   ID:  Donald Roberts, DOB November 22, 1951, MRN [id number]  PCP:  Jinny Sanders, MD  Cardiologist:  Kirk Ruths, MD  Electrophysiologist:  None   Referring MD: Jinny Sanders, MD   Chief Complaint: fatigue  History of Present Illness:    Donald Roberts is a 72 y.o. male with a history of chest pain with severe stenosis of mid RCA noted on coronary CTA in 03/2022 (medical therapy recommended given size of vessel) on cardiac catheterization in 2019, ectatic ascending aorta, PAD with stenosis of bilateral external iliac arteries and left common iliac artery noted on abdominal ultrasound in 01/2020, hypertension, hyperlipidemia, chronic back pain, and tobacco abuse who is followed by Dr. Stanford Breed and presents today for evaluation of fatigue.  Patient was previously followed by Dr. Doylene Canard and now follows with Dr. Stanford Breed. Cardiac catheterization in 11/2018 showed mild non-obstructive CAD with 25% stenosis of ostial to proximal LAD but otherwise normal coronaries. He was referred to Dr. Stanford Breed in 12/2019 for further evaluation of CAD after a chest CT in 08/2019 showed aortic atherosclerosis and coronary calcifications. He reported some dyspnea on exertion at that time as well as one episode of chest pain. However, he denied any exertional chest pain despite being very active. Chest pain was felt to be somewhat atypical and given only mild disease on cardiac catheterization in 11/2018, no additional evaluation was recommended. AAA duplex was ordered for further evaluation of a bruit noted on exam and showed no evidence of AAA but did show >50% stenosis of bilateral external iliac arteries and the left common iliac artery. Chest CTA in 01/2022 for routine follow-up of known dilated ascending aorta showed high-grade ectasia of the ascending aorta measuring up to 3.9cm that was relatively stable from prior imaging in 2021. Also showed moderate bilateral centrilobular  cystic emphysematous changes.   Patient was seen by Diona Browner, NP, in 01/2022 and reported 2-3 episodes of resting chest pain with radiation to his left arm over the past 2 months that resolved with Nitroglycerin. He denied any exertional pain. He also reported increased fatigue. Coronary CTA was ordered and showed a coronary calcium score of 1,1011 (86th percentile for age and sex) with severe stenosis (>70%) in the mid RCA but this was a small non-dominant artery as well as mild stenosis (25%) of proximal LAD and minimal disease (<25%) of proximal to mid LCX. Medical therapy was recommended for further evaluation. Echo was then ordered and showed LVEF of 60-65% with no regional wall motion abnormalities, mild LVH, and normal diastolic parameters as well as aortic valve sclerosis but no aortic stenosis. He was last seen by Diona Browner, NP, in 07/2022 at which time he continued to remote significant low energy most days but he attributed this to only sleeping 1-2 hours at night due to severe nocturia. He reported having been seen by 3 different Urologist in the past but this continues to be an issue. He denied any recurrent chest pain at that time.   Patient presents today for further evaluation of significant fatigue.  He is here with his wife. He reports profound fatigue to the point where he states he can barely move off the couch. His fatigue started about 1 year ago but has gotten progressively worse. He states it is significantly worse than it was at last visit in 07/2022 and his wife confirms this as well. He does have severe nocturia and only is  able to sleep 1-2 hours at a time at night. However, this is not new and despite his nocturia being unchanged, his fatigue has worsened. He reports occasional chest pain as well that resolves after 1-2 doses of sublingual Nitro. In addition, he reports some dyspnea on exertion. He denies any shortness of breath. He states he has slept in a recliner for years due  to comfort and it being easier to breathe this way. He denies any PND or edema. He occasionally has some dizziness if he stands to quickly and actually fell a week or so ago but did not hit is head. No syncope.  Of note, patient looked very uncomfortable in the office and was moving around a lot. He states this is due to his chronic back pain for which he takes Norco 10-36m every 6 hours.  Past Medical History:  Diagnosis Date   Abdominal pain, generalized 07/18/2008   Abnormal gait    ALLERGIC RHINITIS 07/18/2008   Allergy    Back pain, chronic 10/01/2011   Carpal tunnel syndrome on left    Chronic low back pain 12/25/2012   COLONIC POLYPS, HX OF 07/18/2008   CORONARY ARTERY DISEASE 07/18/2008   Degenerative arthritis of right knee 10/01/2011   DIVERTICULOSIS, COLON 07/18/2008   Gross hematuria 07/18/2008   Headache(784.0)    Hx: of Migraines as a child   Heart murmur    HYPERLIPIDEMIA 07/18/2008   Hypertension    Idiopathic progressive neuropathy    PVD (peripheral vascular disease) ([facility]    TOBACCO ABUSE 04/06/2009    Past Surgical History:  Procedure Laterality Date   ANTERIOR CERVICAL DECOMP/DISCECTOMY FUSION N/A 09/10/2013   Procedure: ANTERIOR CERVICAL DECOMPRESSION/DISCECTOMY FUSION CERVICAL FOUR-FIVE,CERVICAL FIVE-SIX WITH PLATING AND BONE GRAFT;  Surgeon: EFloyce Stakes MD;  Location: [facility];  Service: Neurosurgery;  Laterality: N/A;   ANTERIOR CERVICAL DECOMP/DISCECTOMY FUSION N/A 03/14/2015   Procedure: ANTERIOR CERVICAL DECOMPRESSION/DISCECTOMY FUSION  CERVICAL TWO-THREE;  Surgeon: ELeeroy Cha MD;  [facility];  Service: Neurosurgery;  Laterality: N/A;   ANTERIOR CERVICAL DECOMP/DISCECTOMY FUSION N/A 10/03/2020   Procedure: Anterior Cervial Discectomy and Fusion Cervical three and cervical four;  Surgeon: PEarnie Larsson MD;  [facility]  Service: Neurosurgery;  Laterality: N/A;  3C   CARDIAC CATHETERIZATION     COLONOSCOPY     COLONOSCOPY W/  BIOPSIES AND POLYPECTOMY     Hx: of   LEFT HEART CATH AND CORONARY ANGIOGRAPHY N/A 11/04/2018   Procedure: LEFT HEART CATH AND CORONARY ANGIOGRAPHY;  Surgeon: KDixie Dials MD;  [facility];  Service: Cardiovascular;  Laterality: N/A;   lumbar surgury     dr botero/ns; 3 level fusion per pt   POLYPECTOMY     s/p cervical disease x 2      Current Medications: Current Meds  Medication Sig   ezetimibe (ZETIA) 10 MG tablet Take 1 tablet (10 mg total) by mouth daily. (Patient not taking: Reported on 01/14/2023)   HYDROcodone-acetaminophen (NORCO) 10-325 MG tablet Take 1 tablet by mouth every 6 (six) hours as needed for moderate pain. (Patient taking differently: Take 1 tablet by mouth every 6 (six) hours.)   nitroGLYCERIN (NITROSTAT) 0.4 MG SL tablet DISSOLVE 1 TABLET UNDER TONGUE AT ONSET OF CHEST PAIN. MAY REPEAT DOSE EVERY 5 MINUTES UP TO 3 DOSES AS NEEDED. IF PAIN NOT RELIEVED, CALL 911. Strength: 0.4 mg   ramipril (ALTACE) 5 MG capsule Take 1 capsule (5 mg total) by mouth daily.   [  DISCONTINUED] aspirin EC 81 MG EC tablet Take 1 tablet (81 mg total) by mouth daily. (Patient not taking: Reported on 01/14/2023)   [DISCONTINUED] DULoxetine (CYMBALTA) 60 MG capsule Take 1 capsule (60 mg total) by mouth daily.   [DISCONTINUED] NONFORMULARY OR COMPOUNDED ITEM Transdermal therapeutics ( meloxicam 0.5%, gabapentin 6%, lidocaine 2%, prilocaine 2%)   [DISCONTINUED] pregabalin (LYRICA) 150 MG capsule Take 150 mg daily at night. (Patient not taking: Reported on 01/14/2023)   [DISCONTINUED] solifenacin (VESICARE) 5 MG tablet Take 5 mg by mouth daily.     Allergies:   Patient has no known allergies.   Social History   Socioeconomic History   Marital status: Married    Spouse name: Hassan Rowan   Number of children: 1   Years of education: Not on file   Highest education level: High school graduate  Occupational History   Occupation: Chief Strategy Officer and Visual merchandiser    Comment: semi retired   Tobacco Use   Smoking status: Every Day    Packs/day: 0.50    Years: 40.00    Total pack years: 20.00    Types: Cigarettes   Smokeless tobacco: Never  Vaping Use   Vaping Use: Never used  Substance and Sexual Activity   Alcohol use: Yes    Comment: occasional   Drug use: No   Sexual activity: Not Currently  Other Topics Concern   Not on file  Social History Narrative   Married, 1 daughter lives close by.   Spends time at [employer] in [place].   Social Determinants of Health   Financial Resource Strain: Not on file  Food Insecurity: Not on file  Transportation Needs: Not on file  Physical Activity: Not on file  Stress: Not on file  Social Connections: Not on file     Family History: The patient's family history includes Heart attack in his father; Heart disease in his brother, mother, and another family member; Parkinsonism in his paternal uncle. There is no history of Colon cancer, Colon polyps, Esophageal cancer, Rectal cancer, or Stomach cancer.  ROS:   Please see the history of present illness.     EKGs/Labs/Other Studies Reviewed:    The following studies were reviewed today:  Left Cardiac Catheterization 11/04/2018: Ost LAD to Prox LAD lesion is 25% stenosed.   Diagnostic Dominance: Left  _______________  AAA Duplex 01/04/2020: Summary:  Abdominal Aorta:  No evidence of an abdominal aortic aneurysm was visualized.   The largest aortic measurement is 2.4 cm.  No previous exam available for comparison.  Stenosis: +--------------------+-------------+  Location            Stenosis       +--------------------+-------------+  Right Common Iliac  <50% stenosis  +--------------------+-------------+  Left Common Iliac   >50% stenosis  +--------------------+-------------+  Right External Iliac<50% stenosis  +--------------------+-------------+  Left External Iliac >50% stenosis  +--------------------+-------------+   Atherosclerosis in the  aorta and iliac arteries.  >50% stenosis in the left common iliac and external iliac arteries.  _______________  Chest CTA 02/07/2022: Impressions: 1. There is high-grade ectasia of the ascending aorta measures up to 3.9 cm, not significantly changed from 08/17/2020 CT when measured in a similar manner. Consider attention on annual follow-up noncontrast lung cancer screening imaging. 2. Moderate bilateral centrilobular cystic emphysematous changes.  _______________  Coronary CTA 03/21/2022: Impressions: 1. Coronary calcium score of 1011. This was 86th percentile for age and sex matched control. 2. Normal coronary origin with right dominance. 3. Severe stenosis in the mid  RCA (>70%)-small non dominant artery. 4. Mild proximal LAD stenosis (25%) 5. Minimal proximal and mid LCx disease (<25%). 6. Consider symptom-guided anti-ischemic pharmacotherapy as well as risk factor modification per guideline directed care _______________  Echocardiogram 06/18/2022: Impressions: 1. Left ventricular ejection fraction, by estimation, is 60 to 65%. The  left ventricle has normal function. The left ventricle has no regional  wall motion abnormalities. There is mild concentric left ventricular  hypertrophy. Left ventricular diastolic  parameters were normal. The average left ventricular global longitudinal  strain is -25.6 %. The global longitudinal strain is normal.   2. Right ventricular systolic function is normal. The right ventricular  size is normal.   3. The mitral valve is grossly normal. Trivial mitral valve  regurgitation.   4. The aortic valve was not well visualized. There is moderate  calcification of the aortic valve. There is moderate thickening of the  aortic valve. Aortic valve regurgitation is not visualized. Aortic valve  sclerosis/calcification is present, without  any evidence of aortic stenosis.   5. The inferior vena cava is normal in size with greater than 50%   respiratory variability, suggesting right atrial pressure of 3 mmHg.   Comparison(s): No prior Echocardiogram.    EKG:  EKG ordered today. EKG personally reviewed and demonstrates normal sinus rhythm, rate 78 bpm, with some sinus arrhythmia and some mild ST depression in inferior leads but no significant changes compared to prior tracing in 01/2022.   Recent Labs: 02/01/2022: ALT 15 03/18/2022: BUN 15; Creatinine, Ser 0.84; Potassium 4.0; Sodium 137 04/16/2022: Hemoglobin 12.3; Platelets 352.0  Recent Lipid Panel    Component Value Date/Time   CHOL 103 02/01/2022 0949   CHOL 169 12/13/2021 0900   TRIG 54 02/01/2022 0949   HDL 49 02/01/2022 0949   HDL 42 12/13/2021 0900   CHOLHDL 2.1 02/01/2022 0949   VLDL 11 02/01/2022 0949   LDLCALC 43 02/01/2022 0949   LDLCALC 102 (H) 12/13/2021 0900    Physical Exam:    Vital Signs: BP 118/78   Pulse 78   Ht 5' 10"$  (1.778 m)   Wt 164 lb (74.4 kg)   SpO2 97%   BMI 23.53 kg/m     Wt Readings from Last 3 Encounters:  01/14/23 164 lb (74.4 kg)  07/23/22 160 lb (72.6 kg)  04/22/22 146 lb 9.6 oz (66.5 kg)     General: 72 y.o. thin Caucasian male in no acute distress. HEENT: Normocephalic and atraumatic. Sclera clear.  Neck: Supple.  No JVD. Heart: RRR. Distinct S1 and S2. No murmurs, gallops, or rubs. Radial pulses 2+ and equal bilaterally. Lungs: No increased work of breathing. Clear to ausculation bilaterally. No wheezes, rhonchi, or rales.  Abdomen: Soft, non-distended, and non-tender to palpation.  Extremities: No lower extremity edema.    Skin: Warm and dry. Neuro: Alert and oriented x3. No focal deficits. Psych: Normal affect. Responds appropriately.   Assessment:    1. Fatigue, unspecified type   2. Chest pain of uncertain etiology   3. Dyspnea on exertion   4. Coronary artery disease involving native coronary artery of native heart with angina pectoris ([place])   5. Ascending aorta dilatation (H[facility])   6. PAD (peripheral  artery disease) ([place])   7. Primary hypertension   8. Pure hypercholesterolemia   9. Preprocedural cardiovascular examination     Plan:    Fatigue Chest Pain Dyspnea on Exertion Patient biggest complaint today is profound fatigue. He also reports occasional chest pain that resolves after  1-2 doses of sublingual Nitroglycerin and dyspnea on exertion.  - EKG today shows no acute ischemic changes compared to prior tracings. - Worried that fatigue and dyspnea on exertion may be anginal equivalents (although some of dyspnea may be due to deconditioning). He has known CAD with severe stenosis of mid RCA noted on coronary CTA in 03/2022 that was treated medically due to to size of vessel. Echo in 06/2022 showed normal LV function. Given progression of symptoms and profound fatigue, I think he warrants a left cardiac catheterization. Discussed with DOD (Dr. Ellyn Hack) who agrees.  - Will check TSH, CBC, and Vitamin D for further evaluation of fatigue. Will also check BMET as part of pre-procedural labs.   The patient understands that risks include but are not limited to stroke (1 in 1000), death (1 in 53), kidney failure [usually temporary] (1 in 500), bleeding (1 in 200), allergic reaction [possibly serious] (1 in 200), and agrees to proceed.   CAD Coronary CTA in 03/2022 showed a coronary calcium score of 1,1011 (86th percentile for age and sex) with severe stenosis (>70%) in the mid RCA but this was a small non-dominant artery as well as mild stenosis (25%) of proximal LAD and minimal disease (<25%) of proximal to mid LCX. Medical therapy was recommended for further evaluation. - Patient reports occasional chest pain that improves with 1-2 dose of sublingual Nitroglycerin as above. - Continue aspirin and statin.  - Plans is for left heart catheterization as above.   Of note, after patient had already left office I realized it looks like Aspirin has been removed from his medications list. Given known  CAD and PAD, he should be on Aspirin 67m daily. Will advise patient to restart this when we call with lab results.   Ectatic Ascending Aorta Chest CTA in 01/2022 showed a dilated ascending aorta showed high-grade ectasia of the ascending aorta measuring up to 3.9cm that was relatively stable from prior imaging in 2021. - Did not have time to discuss today but will need repeat chest CTA for routine monitoring of this. Can discuss this at follow-up visit.  PAD AAA duplex in 01/2020 showed no evidence of AAA but did show >50% stenosis of bilateral external iliac arteries and the left common iliac artery.  - Continue aspirin and statin.  Hypertension BP well controlled.  - Continue Ramipril 531mdaily.  - Of note, patient described some dizziness with standing consistent with orthostasis. Recommend changing positions slowly, staying well hydrated, and using compressions stockings.   Hyperlipidemia Lipid panel in 01/2022: Total Cholesterol 103, Triglycerides 54, HDL 49, LDL 43. LDL goal <70 given CAD. - Continue Crestor 4059maily.  Disposition: Follow up with me in 2 weeks after cardiac catheterization.   Time: Spent 60 minutes on day of encounter (pre charting, reviewing labs, seeing patient, talking with Dr. HarEllyn Hacknd documenting in [facility]    Medication Adjustments/Labs and Tests Ordered: Current medicines are reviewed at length with the patient today.  Concerns regarding medicines are outlined above.  Orders Placed This Encounter  Procedures   Basic metabolic panel   CBC   TSH   VITAMIN D 25 Hydroxy (Vit-D Deficiency, Fractures)   EKG 12-Lead   No orders of the defined types were placed in this encounter.   Patient Instructions  Medication Instructions:   Your physician recommends that you continue on your current medications as directed. Please refer to the Current Medication list given to you today.  *If you need a refill  on your cardiac medications before your next  appointment, please call your pharmacy*  Lab Work: Your physician recommends that you return for lab work in [place]: BMP CBC TSH Vitamin D If you have labs (blood work) drawn today and your tests are completely normal, you will receive your results only by: [place] (if you have MyChart) OR A paper copy in the mail If you have any lab test that is abnormal or we need to change your treatment, we will call you to review the results.  Testing/Procedures: Your physician assistant has requested that you have a cardiac catheterization. Cardiac catheterization is used to diagnose and/or treat various heart conditions. Doctors may recommend this procedure for a number of different reasons. The most common reason is to evaluate chest pain. Chest pain can be a symptom of coronary artery disease (CAD), and cardiac catheterization can show whether plaque is narrowing or blocking your heart's arteries. This procedure is also used to evaluate the valves, as well as measure the blood flow and oxygen levels in different parts of your heart. For further information please visit [email or web address]. Please follow instruction sheet, as given.  Scheduled for Wednesday 01/15/22 at 10:00 AM  Follow-Up: At [facility], you and your health needs are our priority.  As part of our continuing mission to provide you with exceptional heart care, we have created designated Provider Care Teams.  These Care Teams include your primary Cardiologist (physician) and Advanced Practice Providers (APPs -  Physician Assistants and Nurse Practitioners) who all work together to provide you with the care you need, when you need it.  We recommend signing up for the patient portal called "MyChart".  Sign up information is provided on this After Visit Summary.  MyChart is used to connect with patients for Virtual Visits (Telemedicine).  Patients are able to view lab/test results, encounter notes, upcoming appointments, etc.   Non-urgent messages can be sent to your provider as well.   To learn more about what you can do with MyChart, go to [email or web address].    Your next appointment:   2-3 week(s) after heart cath   Provider:   Sande Rives, PA-C        Other Instructions        Cardiac/Peripheral Catheterization   You are scheduled for a Cardiac Catheterization on Wednesday, February 14 with Dr. Glenetta Hew.  1. Please arrive at the Main Entrance A at [facility]: [place], [place] [address] on February 14 at 8:00 AM (This time is two hours before your procedure to ensure your preparation). Free valet parking service is available. You will check in at ADMITTING. The support person will be asked to wait in the waiting room.  It is OK to have someone drop you off and come back when you are ready to be discharged.        Special note: Every effort is made to have your procedure done on time. Please understand that emergencies sometimes delay scheduled procedures.   . 2. Diet: Do not eat solid foods after midnight.  You may have clear liquids until 5 AM the day of the procedure.  3. Labs: You will need to have blood drawn on Tuesday, February 13 at [facility]  Open: 8am - 5pm (Lunch 12:30 - 1:30)   Phone: [phone]. You do not need to be fasting.  4. Medication instructions in preparation for your procedure:   Contrast Allergy: No  Stop  taking, Ramipril (Altace) Wednesday, February 14,  On the morning of your procedure, take Aspirin 81 mg and any morning medicines NOT listed above.  You may use sips of water.  5. Plan to go home the same day, you will only stay overnight if medically necessary. 6. You MUST have a responsible adult to drive you home. 7. An adult MUST be with you the first 24 hours after you arrive home. 8. Bring a current list of your medications, and the last time and date medication taken. 9. Bring ID and  current insurance cards. 10.Please wear clothes that are easy to get on and off and wear slip-on shoes.  Thank you for allowing [place] to care for you!   -- [facility] Invasive Cardiovascular services        Signed, Eppie Gibson  01/14/2023 1:25 PM    [facility]

## 2023-01-14 ENCOUNTER — Encounter: Payer: Self-pay | Admitting: Student

## 2023-01-14 ENCOUNTER — Ambulatory Visit: Payer: Medicare HMO | Attending: Student | Admitting: Student

## 2023-01-14 VITALS — BP 118/78 | HR 78 | Ht 70.0 in | Wt 164.0 lb

## 2023-01-14 DIAGNOSIS — R0609 Other forms of dyspnea: Secondary | ICD-10-CM | POA: Diagnosis not present

## 2023-01-14 DIAGNOSIS — I25119 Atherosclerotic heart disease of native coronary artery with unspecified angina pectoris: Secondary | ICD-10-CM

## 2023-01-14 DIAGNOSIS — R079 Chest pain, unspecified: Secondary | ICD-10-CM | POA: Diagnosis not present

## 2023-01-14 DIAGNOSIS — R5383 Other fatigue: Secondary | ICD-10-CM

## 2023-01-14 DIAGNOSIS — Z01810 Encounter for preprocedural cardiovascular examination: Secondary | ICD-10-CM

## 2023-01-14 DIAGNOSIS — E7800 Pure hypercholesterolemia, unspecified: Secondary | ICD-10-CM

## 2023-01-14 DIAGNOSIS — I77810 Thoracic aortic ectasia: Secondary | ICD-10-CM

## 2023-01-14 DIAGNOSIS — I10 Essential (primary) hypertension: Secondary | ICD-10-CM | POA: Diagnosis not present

## 2023-01-14 DIAGNOSIS — I739 Peripheral vascular disease, unspecified: Secondary | ICD-10-CM

## 2023-01-14 LAB — CBC
Hematocrit: 42.2 % (ref 37.5–51.0)
Hemoglobin: 14.2 g/dL (ref 13.0–17.7)
MCH: 29.5 pg (ref 26.6–33.0)
MCHC: 33.6 g/dL (ref 31.5–35.7)
MCV: 88 fL (ref 79–97)
Platelets: 287 10*3/uL (ref 150–450)
RBC: 4.82 x10E6/uL (ref 4.14–5.80)
RDW: 13.1 % (ref 11.6–15.4)
WBC: 8.3 10*3/uL (ref 3.4–10.8)

## 2023-01-14 LAB — BASIC METABOLIC PANEL
BUN/Creatinine Ratio: 20 (ref 10–24)
BUN: 20 mg/dL (ref 8–27)
CO2: 25 mmol/L (ref 20–29)
Calcium: 9.7 mg/dL (ref 8.6–10.2)
Chloride: 102 mmol/L (ref 96–106)
Creatinine, Ser: 0.99 mg/dL (ref 0.76–1.27)
Glucose: 92 mg/dL (ref 70–99)
Potassium: 4.5 mmol/L (ref 3.5–5.2)
Sodium: 139 mmol/L (ref 134–144)
eGFR: 82 mL/min/{1.73_m2} (ref >59)

## 2023-01-14 LAB — TSH: TSH: 0.666 u[IU]/mL (ref 0.450–4.500)

## 2023-01-14 NOTE — Patient Instructions (Addendum)
Medication Instructions:   Your physician recommends that you continue on your current medications as directed. Please refer to the Current Medication list given to you today.  *If you need a refill on your cardiac medications before your next appointment, please call your pharmacy*  Lab Work: Your physician recommends that you return for lab work in [place]: BMP CBC TSH Vitamin D If you have labs (blood work) drawn today and your tests are completely normal, you will receive your results only by: [place] (if you have MyChart) OR A paper copy in the mail If you have any lab test that is abnormal or we need to change your treatment, we will call you to review the results.  Testing/Procedures: Your physician assistant has requested that you have a cardiac catheterization. Cardiac catheterization is used to diagnose and/or treat various heart conditions. Doctors may recommend this procedure for a number of different reasons. The most common reason is to evaluate chest pain. Chest pain can be a symptom of coronary artery disease (CAD), and cardiac catheterization can show whether plaque is narrowing or blocking your heart's arteries. This procedure is also used to evaluate the valves, as well as measure the blood flow and oxygen levels in different parts of your heart. For further information please visit [email or web address]. Please follow instruction sheet, as given.  Scheduled for Wednesday 01/15/22 at 10:00 AM  Follow-Up: At [facility], you and your health needs are our priority.  As part of our continuing mission to provide you with exceptional heart care, we have created designated Provider Care Teams.  These Care Teams include your primary Cardiologist (physician) and Advanced Practice Providers (APPs -  Physician Assistants and Nurse Practitioners) who all work together to provide you with the care you need, when you need it.  We recommend signing up for the patient  portal called "MyChart".  Sign up information is provided on this After Visit Summary.  MyChart is used to connect with patients for Virtual Visits (Telemedicine).  Patients are able to view lab/test results, encounter notes, upcoming appointments, etc.  Non-urgent messages can be sent to your provider as well.   To learn more about what you can do with MyChart, go to [email or web address].    Your next appointment:   2-3 week(s) after heart cath   Provider:   Sande Rives, PA-C        Other Instructions        Cardiac/Peripheral Catheterization   You are scheduled for a Cardiac Catheterization on Wednesday, February 14 with Dr. Glenetta Hew.  1. Please arrive at the Main Entrance A at [facility]: [place], [place] [address] on February 14 at 8:00 AM (This time is two hours before your procedure to ensure your preparation). Free valet parking service is available. You will check in at ADMITTING. The support person will be asked to wait in the waiting room.  It is OK to have someone drop you off and come back when you are ready to be discharged.        Special note: Every effort is made to have your procedure done on time. Please understand that emergencies sometimes delay scheduled procedures.   . 2. Diet: Do not eat solid foods after midnight.  You may have clear liquids until 5 AM the day of the procedure.  3. Labs: You will need to have blood drawn on Tuesday, February 13 at [place], [place]  Open: 8am -  5pm (Lunch 12:30 - 1:30)   Phone: [phone]. You do not need to be fasting.  4. Medication instructions in preparation for your procedure:   Contrast Allergy: No  Stop taking, Ramipril (Altace) Wednesday, February 14,  On the morning of your procedure, take Aspirin 81 mg and any morning medicines NOT listed above.  You may use sips of water.  5. Plan to go home the same day, you will only stay overnight if  medically necessary. 6. You MUST have a responsible adult to drive you home. 7. An adult MUST be with you the first 24 hours after you arrive home. 8. Bring a current list of your medications, and the last time and date medication taken. 9. Bring ID and current insurance cards. 10.Please wear clothes that are easy to get on and off and wear slip-on shoes.  Thank you for allowing [place] to care for you!   -- [facility]

## 2023-01-15 ENCOUNTER — Encounter (HOSPITAL_COMMUNITY)
Admission: RE | Disposition: A | Discharge status: Home / Self Care | Payer: Self-pay | Source: Home / Self Care | Attending: Cardiology

## 2023-01-15 ENCOUNTER — Ambulatory Visit (HOSPITAL_COMMUNITY)
Admission: RE | Admit: 2023-01-15 | Discharge: 2023-01-15 | Disposition: A | Discharge status: Home / Self Care | Payer: Medicare HMO | Attending: Cardiology | Admitting: Cardiology

## 2023-01-15 ENCOUNTER — Other Ambulatory Visit: Payer: Self-pay

## 2023-01-15 DIAGNOSIS — M549 Dorsalgia, unspecified: Secondary | ICD-10-CM | POA: Diagnosis not present

## 2023-01-15 DIAGNOSIS — R079 Chest pain, unspecified: Secondary | ICD-10-CM | POA: Insufficient documentation

## 2023-01-15 DIAGNOSIS — I739 Peripheral vascular disease, unspecified: Secondary | ICD-10-CM | POA: Insufficient documentation

## 2023-01-15 DIAGNOSIS — F17210 Nicotine dependence, cigarettes, uncomplicated: Secondary | ICD-10-CM | POA: Insufficient documentation

## 2023-01-15 DIAGNOSIS — Z79899 Other long term (current) drug therapy: Secondary | ICD-10-CM | POA: Diagnosis not present

## 2023-01-15 DIAGNOSIS — E785 Hyperlipidemia, unspecified: Secondary | ICD-10-CM | POA: Insufficient documentation

## 2023-01-15 DIAGNOSIS — R5383 Other fatigue: Secondary | ICD-10-CM | POA: Insufficient documentation

## 2023-01-15 DIAGNOSIS — Z7982 Long term (current) use of aspirin: Secondary | ICD-10-CM | POA: Diagnosis not present

## 2023-01-15 DIAGNOSIS — I77819 Aortic ectasia, unspecified site: Secondary | ICD-10-CM | POA: Diagnosis not present

## 2023-01-15 DIAGNOSIS — I25119 Atherosclerotic heart disease of native coronary artery with unspecified angina pectoris: Secondary | ICD-10-CM | POA: Diagnosis not present

## 2023-01-15 DIAGNOSIS — I10 Essential (primary) hypertension: Secondary | ICD-10-CM | POA: Insufficient documentation

## 2023-01-15 DIAGNOSIS — I2510 Atherosclerotic heart disease of native coronary artery without angina pectoris: Secondary | ICD-10-CM

## 2023-01-15 DIAGNOSIS — G8929 Other chronic pain: Secondary | ICD-10-CM | POA: Insufficient documentation

## 2023-01-15 DIAGNOSIS — R0609 Other forms of dyspnea: Secondary | ICD-10-CM | POA: Diagnosis not present

## 2023-01-15 DIAGNOSIS — I2584 Coronary atherosclerosis due to calcified coronary lesion: Secondary | ICD-10-CM | POA: Insufficient documentation

## 2023-01-15 DIAGNOSIS — R931 Abnormal findings on diagnostic imaging of heart and coronary circulation: Secondary | ICD-10-CM | POA: Diagnosis not present

## 2023-01-15 DIAGNOSIS — I2089 Other forms of angina pectoris: Secondary | ICD-10-CM

## 2023-01-15 DIAGNOSIS — I708 Atherosclerosis of other arteries: Secondary | ICD-10-CM | POA: Diagnosis not present

## 2023-01-15 HISTORY — PX: LEFT HEART CATH AND CORONARY ANGIOGRAPHY: CATH118249

## 2023-01-15 SURGERY — LEFT HEART CATH AND CORONARY ANGIOGRAPHY
Anesthesia: LOCAL

## 2023-01-15 MED ORDER — IOHEXOL 350 MG/ML SOLN
INTRAVENOUS | Status: DC | PRN
  Administered 2023-01-15: 80 mL

## 2023-01-15 MED ORDER — MIDAZOLAM HCL 2 MG/2ML IJ SOLN
INTRAMUSCULAR | Status: DC | PRN
  Administered 2023-01-15: 2 mg via INTRAVENOUS

## 2023-01-15 MED ORDER — VERAPAMIL HCL 2.5 MG/ML IV SOLN
INTRAVENOUS | Status: AC
  Filled 2023-01-15: qty 2

## 2023-01-15 MED ORDER — LIDOCAINE HCL (PF) 1 % IJ SOLN
INTRAMUSCULAR | Status: DC | PRN
  Administered 2023-01-15: 2 mL

## 2023-01-15 MED ORDER — LIDOCAINE HCL (PF) 1 % IJ SOLN
INTRAMUSCULAR | Status: AC
  Filled 2023-01-15: qty 30

## 2023-01-15 MED ORDER — FENTANYL CITRATE (PF) 100 MCG/2ML IJ SOLN
INTRAMUSCULAR | Status: AC
  Filled 2023-01-15: qty 2

## 2023-01-15 MED ORDER — HEPARIN (PORCINE) IN NACL 1000-0.9 UT/500ML-% IV SOLN
INTRAVENOUS | Status: DC | PRN
  Administered 2023-01-15 (×3): 500 mL

## 2023-01-15 MED ORDER — SODIUM CHLORIDE 0.9 % IV SOLN: 250.0000 mL | INTRAVENOUS | Status: DC | PRN

## 2023-01-15 MED ORDER — MIDAZOLAM HCL 2 MG/2ML IJ SOLN
INTRAMUSCULAR | Status: AC
  Filled 2023-01-15: qty 2

## 2023-01-15 MED ORDER — ASPIRIN 81 MG PO CHEW
81.0000 mg | CHEWABLE_TABLET | ORAL | Status: AC
  Administered 2023-01-15: 81 mg via ORAL
  Filled 2023-01-15: qty 1

## 2023-01-15 MED ORDER — SODIUM CHLORIDE 0.9% FLUSH
3.0000 mL | Freq: Two times a day (BID) | INTRAVENOUS | Status: DC
Start: 2023-01-15 — End: 2023-01-15

## 2023-01-15 MED ORDER — SODIUM CHLORIDE 0.9 % WEIGHT BASED INFUSION
3.0000 mL/kg/h | INTRAVENOUS | Status: AC
  Administered 2023-01-15: 3 mL/kg/h via INTRAVENOUS

## 2023-01-15 MED ORDER — HEPARIN SODIUM (PORCINE) 1000 UNIT/ML IJ SOLN
INTRAMUSCULAR | Status: AC
  Filled 2023-01-15: qty 10

## 2023-01-15 MED ORDER — SODIUM CHLORIDE 0.9% FLUSH: 3.0000 mL | INTRAVENOUS | Status: DC | PRN

## 2023-01-15 MED ORDER — HEPARIN SODIUM (PORCINE) 1000 UNIT/ML IJ SOLN
INTRAMUSCULAR | Status: DC | PRN
  Administered 2023-01-15: 3500 [IU] via INTRAVENOUS

## 2023-01-15 MED ORDER — VERAPAMIL HCL 2.5 MG/ML IV SOLN
INTRAVENOUS | Status: DC | PRN
  Administered 2023-01-15: 10 mL via INTRA_ARTERIAL

## 2023-01-15 MED ORDER — SODIUM CHLORIDE 0.9 % WEIGHT BASED INFUSION: 1.0000 mL/kg/h | INTRAVENOUS | Status: DC

## 2023-01-15 MED ORDER — FENTANYL CITRATE (PF) 100 MCG/2ML IJ SOLN
INTRAMUSCULAR | Status: DC | PRN
  Administered 2023-01-15: 25 ug via INTRAVENOUS

## 2023-01-15 SURGICAL SUPPLY — 12 items
CATH INFINITI 5 FR AR1 MOD (CATHETERS) IMPLANT
CATH INFINITI 5FR AL1 (CATHETERS) IMPLANT
CATH INFINITI JR4 5F (CATHETERS) IMPLANT
CATH OPTITORQUE TIG 4.0 5F (CATHETERS) IMPLANT
DEVICE RAD COMP TR BAND LRG (VASCULAR PRODUCTS) IMPLANT
GLIDESHEATH SLEND SS 6F .021 (SHEATH) IMPLANT
GUIDEWIRE INQWIRE 1.5J.035X260 (WIRE) IMPLANT
INQWIRE 1.5J .035X260CM (WIRE) ×1
KIT HEART LEFT (KITS) ×1 IMPLANT
PACK CARDIAC CATHETERIZATION (CUSTOM PROCEDURE TRAY) ×1 IMPLANT
TRANSDUCER W/STOPCOCK (MISCELLANEOUS) ×1 IMPLANT
TUBING CIL FLEX 10 FLL-RA (TUBING) ×1 IMPLANT

## 2023-01-15 NOTE — Interval H&P Note (Signed)
History and Physical Interval Note:  01/15/2023 10:01 AM  Donald Roberts  has presented today for surgery, with the diagnosis of CHEST PAIN & FATIGUE - CONCERN FOR PROGRESSIVE ANGINA.  The various methods of treatment have been discussed with the patient and family. After consideration of risks, benefits and other options for treatment, the patient has consented to  Procedure(s): LEFT HEART CATH AND CORONARY ANGIOGRAPHY (N/A) PERCUTANEOUS CORONARY INTERVENTION   as a surgical intervention.  The patient's history has been reviewed, patient examined, no change in status, stable for surgery.  I have reviewed the patient's chart and labs.  Questions were answered to the patient's satisfaction.    Cath Lab Visit (complete for each Cath Lab visit)  Clinical Evaluation Leading to the Procedure:   ACS: No.  Non-ACS:    Anginal Classification: CCS III  Anti-ischemic medical therapy: No Therapy  Non-Invasive Test Results: Equivocal test results  Prior CABG: No previous CABG    Glenetta Hew

## 2023-01-15 NOTE — Progress Notes (Addendum)
pt very discruntled at end of recovery, states his back hurts, he wants to leave, wife states, just let him go, his back hurts. pt and wife left without verbalized dc instructions, he walked out, would not ride out, walked with wife. Nothing went abnormal through recovery, pt / wife were nice till last interaction.

## 2023-01-16 ENCOUNTER — Encounter (HOSPITAL_COMMUNITY): Payer: Self-pay | Admitting: Cardiology

## 2023-01-25 NOTE — Progress Notes (Deleted)
Cardiology Office Note:    Date:  01/25/2023   ID:  Donald Roberts, DOB [birth year], MRN [id number]  PCP:  Jinny Sanders, MD  Cardiologist:  Kirk Ruths, MD  Electrophysiologist:  None   Referring MD: Jinny Sanders, MD   Chief Complaint: follow-up of fatigue and cardiac catheterization  History of Present Illness:    Donald Roberts is a 72 y.o. male with a history of chest pain with severe stenosis of mid RCA noted on coronary CTA in 03/2022 (medical therapy recommended given size of vessel) on cardiac catheterization in 2019, ectatic ascending aorta, PAD with stenosis of bilateral external iliac arteries and left common iliac artery noted on abdominal ultrasound in 01/2020, hypertension, hyperlipidemia, chronic back pain, and tobacco abuse who is followed by Dr. Stanford Breed and presents today for follow-up of fatigue and cardiac catheterization.   Patient was previously followed by Dr. Doylene Canard and now follows with Dr. Stanford Breed. Cardiac catheterization in 11/2018 showed mild non-obstructive CAD with 25% stenosis of ostial to proximal LAD but otherwise normal coronaries. He was referred to Dr. Stanford Breed in 12/2019 for further evaluation of CAD after a chest CT in 08/2019 showed aortic atherosclerosis and coronary calcifications. He reported some dyspnea on exertion at that time as well as one episode of chest pain. However, he denied any exertional chest pain despite being very active. Chest pain was felt to be somewhat atypical and given only mild disease on cardiac catheterization in 11/2018, no additional evaluation was recommended. AAA duplex was ordered for further evaluation of a bruit noted on exam and showed no evidence of AAA but did show >50% stenosis of bilateral external iliac arteries and the left common iliac artery. 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 Also showed moderate bilateral centrilobular cystic emphysematous changes.   He was seen by Diona Browner, in 01/2022 and reported 2-3 episodes of resting chest pain with radiation to his left arm over the past 2 months that resolved with Nitroglycerin. He denied any exertional pain. He also reported increased fatigue. Coronary CTA was ordered and showed a coronary calcium score of 1,1011 (86th percentile for age and sex) with severe stenosis (>70%) in the mid RCA but this was a small non-dominant artery as well as mild stenosis (25%) of proximal LAD and minimal disease (<25%) of proximal to mid LCX. Medical therapy was recommended for further evaluation. Echo was then ordered and showed LVEF of 60-65% with no regional wall motion abnormalities, mild LVH, and normal diastolic parameters as well as aortic valve sclerosis but no aortic stenosis.   Patient was last seen by me on 01/14/2023 at which time he reported profound fatigue that was getting progressively worse. He was not sleep well at night due to frequent nocturia but this was not new and stable. Despite this being unchanged, he felt like his fatigue was getting significantly worse to the point where he can barely move off the couch. He also reported occasional episodes of chest pain. TSH was normal. Vitamin D was also ordered but it looks like this was never drawn. Cardiac catheterization was also arranged and showed mild to moderate non-obstructive CAD with 25% stenosis of proximal LAD, 45% stenosis of the mid to distal LAD, and 55% stenosis of a mid non-dominant small RCA.  Patient presents today for follow-up.  ***  Fatigue  Patient continues to have profound fatigue. Cardiac work-up has been unremarkable. Echo in 06/2022 showed normal LV function. Recent cardiac catheterization in 01/2023 showed only midl to moderate non-obstructive CAD. TSH at last visit was normal. Vitamin D was ordered but looks like this was never draw.  -  Cardiac work-up has been reassuring. It does not look like his fatigue is cardiac in nature. Recommend following up with PCP.  Chest Pain Non-Obstructive CAD Recent LHC in 01/2023 showed mild to moderate non-obstructive CAD with 25% stenosis of proximal LAD, 45% stenosis of the mid to distal LAD, and 55% stenosis of a mid non-dominant small RCA. - *** - Continue aspirin and statin. ***   Ectatic Ascending Aorta Chest CTA in 01/2022 showed a dilated ascending aorta showed high-grade ectasia of the ascending aorta measuring up to 3.9cm that was relatively stable from prior imaging in 2021. - Will repeat chest CTA for routine monitor of this. ***   PAD AAA duplex in 01/2020 showed no evidence of AAA but did show >50% stenosis of bilateral external iliac arteries and the left common iliac artery.  - No claudication. *** - Continue aspirin and statin.   Hypertension BP well controlled.  - Continue Ramipril '5mg'$  daily.  - Of note, patient described some dizziness with standing consistent with orthostasis. Recommend changing positions slowly, staying well hydrated, and using compressions stockings. ***   Hyperlipidemia Lipid panel in 01/2022: Total Cholesterol 103, Triglycerides 54, HDL 49, LDL 43. LDL goal <70 given CAD. - Continue Crestor '40mg'$  daily.    Past Medical History:  Diagnosis Date   Abdominal pain, generalized 07/18/2008   Abnormal gait    ALLERGIC RHINITIS 07/18/2008   Allergy    Back pain, chronic 10/01/2011   Carpal tunnel syndrome on left    Chronic low back pain 12/25/2012   COLONIC POLYPS, HX OF 07/18/2008   CORONARY ARTERY DISEASE 07/18/2008   Degenerative arthritis of right knee 10/01/2011   DIVERTICULOSIS, COLON 07/18/2008   Gross hematuria 07/18/2008   Headache(784.0)    Hx: of Migraines as a child   Heart murmur    HYPERLIPIDEMIA 07/18/2008   Hypertension    Idiopathic progressive neuropathy    PVD (peripheral vascular disease) ([place])    TOBACCO ABUSE 04/06/2009     Past Surgical History:  Procedure Laterality Date   ANTERIOR CERVICAL DECOMP/DISCECTOMY FUSION N/A 09/10/2013   Procedure: ANTERIOR CERVICAL DECOMPRESSION/DISCECTOMY FUSION CERVICAL FOUR-FIVE,CERVICAL FIVE-SIX WITH PLATING AND BONE GRAFT;  Surgeon: Floyce Stakes, MD;  Location: [facility];  Service: Neurosurgery;  Laterality: N/A;   ANTERIOR CERVICAL DECOMP/DISCECTOMY FUSION N/A 03/14/2015   Procedure: ANTERIOR CERVICAL DECOMPRESSION/DISCECTOMY FUSION  CERVICAL TWO-THREE;  Surgeon: Leeroy Cha, MD;  Location: [facility];  Service: Neurosurgery;  Laterality: N/A;   ANTERIOR CERVICAL DECOMP/DISCECTOMY FUSION N/A 10/03/2020   Procedure: Anterior Cervial Discectomy and Fusion Cervical three and cervical four;  Surgeon: Earnie Larsson, MD;  Location: [facility];  Service: Neurosurgery;  Laterality: N/A;  3C   CARDIAC CATHETERIZATION     COLONOSCOPY     COLONOSCOPY W/ BIOPSIES AND POLYPECTOMY     Hx: of   LEFT HEART CATH AND CORONARY ANGIOGRAPHY N/A 11/04/2018   Procedure: LEFT HEART CATH AND CORONARY ANGIOGRAPHY;  Surgeon: Dixie Dials, MD;  Location: [facility];  Service: Cardiovascular;  Laterality: N/A;   LEFT HEART CATH AND CORONARY ANGIOGRAPHY N/A 01/15/2023   Procedure: LEFT HEART CATH AND CORONARY ANGIOGRAPHY;  Surgeon: Leonie Man, MD;  Location: [facility]  [facility];  Service: Cardiovascular;  Laterality: N/A;   lumbar surgury     dr botero/ns; 3 level fusion per pt   POLYPECTOMY     s/p cervical disease x 2      Current Medications: No outpatient medications have been marked as taking for the 02/05/23 encounter (Appointment) with Darreld Mclean, PA-C.     Allergies:   Patient has no known allergies.   Social History   Socioeconomic History   Marital status: Married    Spouse name: Hassan Rowan   Number of children: 1   Years of education: Not on file   Highest education level: High school graduate  Occupational History   Occupation: Chief Strategy Officer and Visual merchandiser     Comment: semi retired  Tobacco Use   Smoking status: Every Day    Packs/day: 0.50    Years: 40.00    Total pack years: 20.00    Types: Cigarettes   Smokeless tobacco: Never  Vaping Use   Vaping Use: Never used  Substance and Sexual Activity   Alcohol use: Yes    Comment: occasional   Drug use: No   Sexual activity: Not Currently  Other Topics Concern   Not on file  Social History Narrative   Married, 1 daughter lives close by.   Spends time at [employer] in [place].   Social Determinants of Health   Financial Resource Strain: Not on file  Food Insecurity: Not on file  Transportation Needs: Not on file  Physical Activity: Not on file  Stress: Not on file  Social Connections: Not on file     Family History: The patient's family history includes Heart attack in his father; Heart disease in his brother, mother, and another family member; Parkinsonism in his paternal uncle. There is no history of Colon cancer, Colon polyps, Esophageal cancer, Rectal cancer, or Stomach cancer.  ROS:   Please see the history of present illness.     EKGs/Labs/Other Studies Reviewed:    The following studies were reviewed:  AAA Duplex 01/04/2020: Summary:  Abdominal Aorta:  No evidence of an abdominal aortic aneurysm was visualized.   The largest aortic measurement is 2.4 cm.  No previous exam available for comparison.  Stenosis: +--------------------+-------------+  Location            Stenosis       +--------------------+-------------+  Right Common Iliac  <50% stenosis  +--------------------+-------------+  Left Common Iliac   >50% stenosis  +--------------------+-------------+  Right External Iliac<50% stenosis  +--------------------+-------------+  Left External Iliac >50% stenosis  +--------------------+-------------+   Atherosclerosis in the aorta and iliac arteries.  >50% stenosis in the left common iliac and external iliac arteries.  _______________    Chest CTA 02/07/2022: Impressions: 1. There is high-grade ectasia of the ascending aorta measures up to 3.9 cm, not significantly changed from 08/17/2020 CT when measured in a similar manner. Consider attention on annual follow-up noncontrast lung cancer screening imaging. 2. Moderate bilateral centrilobular cystic emphysematous changes.   _______________   Coronary CTA 03/21/2022: Impressions: 1. Coronary calcium score of 1011. This was 86th percentile for age and sex matched control. 2. Normal coronary origin with right dominance. 3. Severe stenosis in the mid RCA (>70%)-small non dominant artery. 4. Mild proximal LAD stenosis (25%) 5. Minimal proximal and mid LCx disease (<25%). 6. Consider symptom-guided anti-ischemic pharmacotherapy as well as risk factor modification per guideline directed care _______________   Echocardiogram 06/18/2022: Impressions: 1. Left ventricular ejection fraction, by estimation, is 60 to  65%. The  left ventricle has normal function. The left ventricle has no regional  wall motion abnormalities. There is mild concentric left ventricular  hypertrophy. Left ventricular diastolic  parameters were normal. The average left ventricular global longitudinal  strain is -25.6 %. The global longitudinal strain is normal.   2. Right ventricular systolic function is normal. The right ventricular  size is normal.   3. The mitral valve is grossly normal. Trivial mitral valve  regurgitation.   4. The aortic valve was not well visualized. There is moderate  calcification of the aortic valve. There is moderate thickening of the  aortic valve. Aortic valve regurgitation is not visualized. Aortic valve  sclerosis/calcification is present, without  any evidence of aortic stenosis.   5. The inferior vena cava is normal in size with greater than 50%  respiratory variability, suggesting right atrial pressure of 3 mmHg.  _______________  Left Cardiac Catheterization in  01/13/2023:   The left ventricular ejection fraction is 55-65% by visual estimate.   Mild to moderate two-vessel disease and a left dominant system:  45% mid to distal LAD and a small portion of the vessel beyond major D2, and 55% stenosis in the mid nondominant RCA-small caliber. . Normal D2 and normal dominant LCx with OM1, LPL 1 1, LPL 2 and LPDA Apparent normal EF with normal EDP.   Recommendations: Consider noncoronary disease related etiology to explain symptoms.  Diagnostic Dominance: Left      EKG:  EKG not ordered today.   Recent Labs: 02/01/2022: ALT 15 01/14/2023: BUN 20; Creatinine, Ser 0.99; Hemoglobin 14.2; Platelets 287; Potassium 4.5; Sodium 139; TSH 0.666  Recent Lipid Panel    Component Value Date/Time   CHOL 103 02/01/2022 0949   CHOL 169 12/13/2021 0900   TRIG 54 02/01/2022 0949   HDL 49 02/01/2022 0949   HDL 42 12/13/2021 0900   CHOLHDL 2.1 02/01/2022 0949   VLDL 11 02/01/2022 0949   LDLCALC 43 02/01/2022 0949   LDLCALC 102 (H) 12/13/2021 0900    Physical Exam:    Vital Signs: There were no vitals taken for this visit.    Wt Readings from Last 3 Encounters:  01/15/23 164 lb (74.4 kg)  01/14/23 164 lb (74.4 kg)  07/23/22 160 lb (72.6 kg)     General: 72 y.o. male in no acute distress. HEENT: Normocephalic and atraumatic. Sclera clear. EOMs intact. Neck: Supple. No carotid bruits. No JVD. Heart: *** RRR. Distinct S1 and S2. No murmurs, gallops, or rubs. Radial and distal pedal pulses 2+ and equal bilaterally. Lungs: No increased work of breathing. Clear to ausculation bilaterally. No wheezes, rhonchi, or rales.  Abdomen: Soft, non-distended, and non-tender to palpation. Bowel sounds present in all 4 quadrants.  MSK: Normal strength and tone for age. *** Extremities: No lower extremity edema.    Skin: Warm and dry. Neuro: Alert and oriented x3. No focal deficits. Psych: Normal affect. Responds appropriately.   Assessment:    No diagnosis  found.  Plan:     Disposition: Follow up in ***   Medication Adjustments/Labs and Tests Ordered: Current medicines are reviewed at length with the patient today.  Concerns regarding medicines are outlined above.  No orders of the defined types were placed in this encounter.  No orders of the defined types were placed in this encounter.   There are no Patient Instructions on file for this visit.   Liane Comber, PA-C  01/25/2023 11:58 AM    [facility]

## 2023-02-04 ENCOUNTER — Encounter: Payer: Self-pay | Admitting: Student

## 2023-02-05 ENCOUNTER — Ambulatory Visit: Payer: Medicare HMO | Admitting: Student

## 2023-03-04 NOTE — Progress Notes (Deleted)
HPI: FU CAD.  Abdominal ultrasound February 2021 showed greater than 50% stenosis in the left common iliac and external iliac arteries but no abdominal aortic aneurysm.  Coronary CTA April 2023 showed greater than 70% severe stenosis in a small nondominant right coronary artery and calcium score 1011 which was 86 percentile.  Echocardiogram July 2023 showed normal LV function, mild left ventricular hypertrophy.  Cardiac catheterization February 2014 showed ejection fraction 55 to 65%, 45% mid to distal LAD and 55% mid nondominant RCA; normal left ventricular end-diastolic pressure.  Since last seen   Current Outpatient Medications  Medication Sig Dispense Refill   ezetimibe (ZETIA) 10 MG tablet Take 1 tablet (10 mg total) by mouth daily. (Patient not taking: Reported on 01/14/2023) 90 tablet 3   HYDROcodone-acetaminophen (NORCO) 10-325 MG tablet Take 1 tablet by mouth every 6 (six) hours as needed for moderate pain. (Patient taking differently: Take 1 tablet by mouth every 6 (six) hours.) 40 tablet 0   nitroGLYCERIN (NITROSTAT) 0.4 MG SL tablet DISSOLVE 1 TABLET UNDER TONGUE AT ONSET OF CHEST PAIN. MAY REPEAT DOSE EVERY 5 MINUTES UP TO 3 DOSES AS NEEDED. IF PAIN NOT RELIEVED, CALL 911. Strength: 0.4 mg 25 tablet 3   pregabalin (LYRICA) 200 MG capsule Take 200 mg by mouth at bedtime.     ramipril (ALTACE) 5 MG capsule Take 1 capsule (5 mg total) by mouth daily. 90 capsule 3   rosuvastatin (CRESTOR) 40 MG tablet Take 1 tablet (40 mg total) by mouth daily. 90 tablet 3   zolpidem (AMBIEN) 5 MG tablet Take 5 mg by mouth See admin instructions. Every other night     No current facility-administered medications for this visit.     Past Medical History:  Diagnosis Date   Abnormal gait    Allergic rhinitis, cause unspecified 07/18/2008   Back pain, chronic 10/01/2011   CAD (coronary artery disease)    Carpal tunnel syndrome on left    Chronic low back pain 12/25/2012   Degenerative arthritis of  right knee 10/01/2011   Diverticulosis of colon (without mention of hemorrhage) 07/18/2008   Gross hematuria 07/18/2008   Headache(784.0)    Hx: of Migraines as a child   Hyperlipidemia 07/18/2008   Hypertension    Idiopathic progressive neuropathy    Personal history of colonic polyps 07/18/2008   PVD (peripheral vascular disease) ([place])    Tobacco use disorder 04/06/2009    Past Surgical History:  Procedure Laterality Date   ANTERIOR CERVICAL DECOMP/DISCECTOMY FUSION N/A 09/10/2013   Procedure: ANTERIOR CERVICAL DECOMPRESSION/DISCECTOMY FUSION CERVICAL FOUR-FIVE,CERVICAL FIVE-SIX WITH PLATING AND BONE GRAFT;  Surgeon: Floyce Stakes, MD;  Location: [facility];  Service: Neurosurgery;  Laterality: N/A;   ANTERIOR CERVICAL DECOMP/DISCECTOMY FUSION N/A 03/14/2015   Procedure: ANTERIOR CERVICAL DECOMPRESSION/DISCECTOMY FUSION  CERVICAL TWO-THREE;  Surgeon: Leeroy Cha, MD;  Location: [facility];  Service: Neurosurgery;  Laterality: N/A;   ANTERIOR CERVICAL DECOMP/DISCECTOMY FUSION N/A 10/03/2020   Procedure: Anterior Cervial Discectomy and Fusion Cervical three and cervical four;  Surgeon: Earnie Larsson, MD;  Location: [facility];  Service: Neurosurgery;  Laterality: N/A;  3C   CARDIAC CATHETERIZATION     COLONOSCOPY     COLONOSCOPY W/ BIOPSIES AND POLYPECTOMY     Hx: of   LEFT HEART CATH AND CORONARY ANGIOGRAPHY N/A 11/04/2018   Procedure: LEFT HEART CATH AND CORONARY ANGIOGRAPHY;  Surgeon: Dixie Dials, MD;  Location: [facility];  Service: Cardiovascular;  Laterality: N/A;  LEFT HEART CATH AND CORONARY ANGIOGRAPHY N/A 01/15/2023   Procedure: LEFT HEART CATH AND CORONARY ANGIOGRAPHY;  Surgeon: Leonie Man, MD;  Location: [facility];  Service: Cardiovascular;  Laterality: N/A;   lumbar surgury     dr botero/ns; 3 level fusion per pt   POLYPECTOMY     s/p cervical disease x 2      Social History   Socioeconomic History   Marital status: Married    Spouse name:  Hassan Rowan   Number of children: 1   Years of education: Not on file   Highest education level: High school graduate  Occupational History   Occupation: Chief Strategy Officer and Visual merchandiser    Comment: semi retired  Tobacco Use   Smoking status: Every Day    Packs/day: 0.50    Years: 40.00    Additional pack years: 0.00    Total pack years: 20.00    Types: Cigarettes   Smokeless tobacco: Never  Vaping Use   Vaping Use: Never used  Substance and Sexual Activity   Alcohol use: Yes    Comment: occasional   Drug use: No   Sexual activity: Not Currently  Other Topics Concern   Not on file  Social History Narrative   Married, 1 daughter lives close by.   Spends time at [employer] in [place].   Social Determinants of Health   Financial Resource Strain: Not on file  Food Insecurity: Not on file  Transportation Needs: Not on file  Physical Activity: Not on file  Stress: Not on file  Social Connections: Not on file  Intimate Partner Violence: Not on file    Family History  Problem Relation Age of Onset   Heart attack Father    Heart disease Brother    Heart disease Mother    Heart disease Other    Parkinsonism Paternal Uncle    Colon cancer Neg Hx    Colon polyps Neg Hx    Esophageal cancer Neg Hx    Rectal cancer Neg Hx    Stomach cancer Neg Hx     ROS: no fevers or chills, productive cough, hemoptysis, dysphasia, odynophagia, melena, hematochezia, dysuria, hematuria, rash, seizure activity, orthopnea, PND, pedal edema, claudication. Remaining systems are negative.  Physical Exam: Well-developed well-nourished in no acute distress.  Skin is warm and dry.  HEENT is normal.  Neck is supple.  Chest is clear to auscultation with normal expansion.  Cardiovascular exam is regular rate and rhythm.  Abdominal exam nontender or distended. No masses palpated. Extremities show no edema. neuro grossly intact  ECG- personally reviewed  A/P  1 coronary artery disease-nonobstructive  on recent catheterization.  Continue medical therapy with aspirin and statin.  Patient does have occasional atypical chest pain previously.  2 hypertension-patient's blood pressure is controlled.  Continue present medical regimen.  3 hyperlipidemia-continue statin.  4 tobacco abuse-patient counseled on discontinuing.  5 peripheral vascular disease-continue aspirin and statin.  He denies claudication at present.  6 history of aortic ectasia-3.9 cm on most recent CTA March 2023.  Kirk Ruths, MD

## 2023-03-11 ENCOUNTER — Ambulatory Visit: Payer: Medicare HMO | Admitting: Cardiology

## 2023-03-18 ENCOUNTER — Other Ambulatory Visit: Payer: Self-pay

## 2023-03-18 ENCOUNTER — Emergency Department: Payer: Medicare HMO

## 2023-03-18 DIAGNOSIS — Z9861 Coronary angioplasty status: Secondary | ICD-10-CM | POA: Diagnosis not present

## 2023-03-18 DIAGNOSIS — Z5321 Procedure and treatment not carried out due to patient leaving prior to being seen by health care provider: Secondary | ICD-10-CM | POA: Diagnosis not present

## 2023-03-18 DIAGNOSIS — R079 Chest pain, unspecified: Secondary | ICD-10-CM | POA: Diagnosis present

## 2023-03-18 LAB — COMPREHENSIVE METABOLIC PANEL
ALT: 15 U/L (ref 0–44)
AST: 20 U/L (ref 15–41)
Albumin: 3.8 g/dL (ref 3.5–5.0)
Alkaline Phosphatase: 39 U/L (ref 38–126)
Anion gap: 6 (ref 5–15)
BUN: 21 mg/dL (ref 8–23)
CO2: 25 mmol/L (ref 22–32)
Calcium: 8.8 mg/dL — ABNORMAL LOW (ref 8.9–10.3)
Chloride: 105 mmol/L (ref 98–111)
Creatinine, Ser: 1.15 mg/dL (ref 0.61–1.24)
GFR, Estimated: >60 mL/min (ref >60)
Glucose, Bld: 98 mg/dL (ref 70–99)
Potassium: 3.8 mmol/L (ref 3.5–5.1)
Sodium: 136 mmol/L (ref 135–145)
Total Bilirubin: 0.5 mg/dL (ref 0.3–1.2)
Total Protein: 7.2 g/dL (ref 6.5–8.1)

## 2023-03-18 LAB — TROPONIN I (HIGH SENSITIVITY): Troponin I (High Sensitivity): 5 ng/L (ref <18)

## 2023-03-18 LAB — CBC
HCT: 37.8 % — ABNORMAL LOW (ref 39.0–52.0)
Hemoglobin: 12.4 g/dL — ABNORMAL LOW (ref 13.0–17.0)
MCH: 29.8 pg (ref 26.0–34.0)
MCHC: 32.8 g/dL (ref 30.0–36.0)
MCV: 90.9 fL (ref 80.0–100.0)
Platelets: 259 10*3/uL (ref 150–400)
RBC: 4.16 MIL/uL — ABNORMAL LOW (ref 4.22–5.81)
RDW: 13.2 % (ref 11.5–15.5)
WBC: 7 10*3/uL (ref 4.0–10.5)
nRBC: 0 % (ref 0.0–0.2)

## 2023-03-18 NOTE — ED Provider Triage Note (Signed)
Emergency Medicine Provider Triage Evaluation Note  Donald Roberts , a 72 y.o. male  was evaluated in triage.  Pt complains of intermittent chest pain/spasm since around 4pm. Heart cath in February, no stents..  Physical Exam  There were no vitals taken for this visit. Gen:   Awake, no distress   Resp:  Normal effort  MSK:   Moves extremities without difficulty  Other:    Medical Decision Making  Medically screening exam initiated at 10:43 PM.  Appropriate orders placed.  Donald Roberts was informed that the remainder of the evaluation will be completed by another provider, this initial triage assessment does not replace that evaluation, and the importance of remaining in the ED until their evaluation is complete.  Cardiac protocol initiated.   Donald Pester, FNP 03/18/23 2245

## 2023-03-18 NOTE — ED Triage Notes (Signed)
Pt presents to ER with c/o left side chest pain that started around 2045, and is stabbing and intermittent in nature.  Pt denies any radiation of pain.  Pt took 2 ntg pta with no change is pain levels.  Pt states he had heart cath 2/14, but did not have any stents placed.  Pt noted to have intermittent episodes of immediately doubling over, c;clutching chest when pain comes along every few minutes.  Pt is otherwise A&O x4 and in NAD.

## 2023-03-19 ENCOUNTER — Emergency Department
Admission: EM | Admit: 2023-03-19 | Discharge: 2023-03-19 | Discharge status: Against Medical Advice | Payer: Medicare HMO | Attending: Emergency Medicine | Admitting: Emergency Medicine

## 2023-03-19 ENCOUNTER — Telehealth: Payer: Self-pay | Admitting: Cardiology

## 2023-03-19 NOTE — ED Notes (Signed)
No answer when called several times from lobby 

## 2023-03-19 NOTE — Telephone Encounter (Signed)
Wife calling in to say that they had to go to the ER because patient was experience CP. But wasn't able to be seeing due to his back pains. Wife is asking that you look at patient blood work and get back with them. Please advise

## 2023-03-19 NOTE — Telephone Encounter (Signed)
Patient started with CP last night about 9:30 pm.  States pushing on chest and wife noticed and ask when he states having pain   Lasted 2-3 minutes, then again about 10-15 minutes later.  Had approx 7 episodes and they called granddaughter who works with EMS to come evaluate.  She advised him to go to ED.  He did go to ED and had labs and EKG done.  Wife states was there for 6 hours without being seen, was in the waiting room during this time and had several more episodes. She states that patient is unable to sit that long due to his back pain so they left approx 1 AM.  She ask if labs and EKG can be viewed by the provider and see what follow up he may need. Wife states no pain since left the ED and fine today. No SOB, no nausea, vomiting. She continued on for approx 11 minutes with information from his past and what will the doctor do  or order what does the doctor think is the cause. She states his labs should so something and what does it show and what can be done.  I advised multiple times that I cannot answer these questions and it would be to the doctors discretion what needs to be done going forward.

## 2023-03-19 NOTE — Telephone Encounter (Signed)
Left VM to call office back.

## 2023-03-21 NOTE — Telephone Encounter (Signed)
Spoke with pt wife, per the patient he will wait for his follow up appointment with dr Jens Som in August. He does not want to be seen at this time. Aware to call if he has any more problems.

## 2023-04-03 ENCOUNTER — Ambulatory Visit: Payer: Medicare HMO | Admitting: Cardiology

## 2023-05-28 ENCOUNTER — Telehealth: Payer: Self-pay | Admitting: Family Medicine

## 2023-05-28 NOTE — Telephone Encounter (Signed)
I have no concerns about transfer.  This is a very pleasant gentleman with no red flags.

## 2023-05-28 NOTE — Telephone Encounter (Signed)
Pt is requesting a Transfer of Care -  From:   Maryellen Pile, MD @ [facility] @ [address]  To:        B. [place], MD @ [facility] @ [place]  Are both providers okay with this request?  Please advise.  Respectfully,  Isa C.

## 2023-05-28 NOTE — Telephone Encounter (Signed)
Fine with me

## 2023-05-30 NOTE — Telephone Encounter (Signed)
Pt's TOC has been scheduled for Friday, 07/04/23.

## 2023-06-18 ENCOUNTER — Encounter: Payer: Self-pay | Admitting: Family

## 2023-06-18 ENCOUNTER — Ambulatory Visit (INDEPENDENT_AMBULATORY_CARE_PROVIDER_SITE_OTHER): Payer: Medicare HMO | Admitting: Family

## 2023-06-18 VITALS — BP 92/64 | HR 67 | Resp 16 | Ht 69.88 in | Wt 172.2 lb

## 2023-06-18 DIAGNOSIS — I700 Atherosclerosis of aorta: Secondary | ICD-10-CM

## 2023-06-18 DIAGNOSIS — I10 Essential (primary) hypertension: Secondary | ICD-10-CM | POA: Diagnosis not present

## 2023-06-18 DIAGNOSIS — I2510 Atherosclerotic heart disease of native coronary artery without angina pectoris: Secondary | ICD-10-CM

## 2023-06-18 DIAGNOSIS — R5383 Other fatigue: Secondary | ICD-10-CM

## 2023-06-18 DIAGNOSIS — M4722 Other spondylosis with radiculopathy, cervical region: Secondary | ICD-10-CM

## 2023-06-18 DIAGNOSIS — E782 Mixed hyperlipidemia: Secondary | ICD-10-CM

## 2023-06-18 DIAGNOSIS — R350 Frequency of micturition: Secondary | ICD-10-CM

## 2023-06-18 DIAGNOSIS — H6122 Impacted cerumen, left ear: Secondary | ICD-10-CM

## 2023-06-18 NOTE — Progress Notes (Signed)
Provider: Richarda Blade FNP-C   Abundio Teuscher, Donalee Citrin, NP  Patient Care Team: Alvey Brockel, Donalee Citrin, NP as PCP - General (Family Medicine) Lewayne Bunting, MD as PCP - Cardiology (Cardiology) Odette Fraction, MD (Inactive) as Consulting Physician (Anesthesiology) Hilda Lias, MD as Consulting Physician (Neurosurgery)  Extended Emergency Contact Information Primary Emergency Contact: Bishop,Holly Mobile Phone: [phone] Relation: Daughter Preferred language: English Interpreter needed? No Secondary Emergency Contact: Direnzo,[name] Address: [address]          [place], [place] [address] [place] Home Phone: [phone] Mobile Phone: [phone] Relation: Spouse  Code Status:  Full Code  Goals of care: Advanced Directive information    03/18/2023   10:44 PM  Advanced Directives  Does Patient Have a Medical Advance Directive? No     Chief Complaint  Patient presents with   Establish Care    No energy for last two years, lots or going to bathroom. Was given levothyroxine on Monday but has not gotten it yet.     HPI:  Pt is a 72 y.o. male seen today establish care here at [facility] for medical management of chronic diseases.  Has a medical history of essential hypertension, hyperlipidemia, CAD with 25 % stenosis of ostial to promximal LAD, aortic atherosclerosis, insomnia, chronic low back pain, chronic cervical radiculopathy, neuropathy, chronic pain syndrome, hypothyroidism,Former cigarette smoker  among others.  He complains of tired all the time. States wakes feeling tired.Has taken vitamin B 12.states started one month after getting COVID-19 vaccine.   Cigarette smoking - smokes 1 and 1/2 pk per day for 57 yrs quit December,2023. States oxygen stays above 90's.   States had catheterization done last month.had x 4 blockage 20-30% has medication.  States schedule for colonoscopy on 07/14/2023   Due for shingles,COVID-19 vaccine     Past  Medical History:  Diagnosis Date   Abnormal gait    Allergic rhinitis, cause unspecified 07/18/2008   Back pain, chronic 10/01/2011   CAD (coronary artery disease)    Carpal tunnel syndrome on left    Chronic low back pain 12/25/2012   Degenerative arthritis of right knee 10/01/2011   Diverticulosis of colon (without mention of hemorrhage) 07/18/2008   Gross hematuria 07/18/2008   Headache(784.0)    Hx: of Migraines as a child   Hyperlipidemia 07/18/2008   Hypertension    Idiopathic progressive neuropathy    Personal history of colonic polyps 07/18/2008   PVD (peripheral vascular disease) ([facility])    Tobacco use disorder 04/06/2009   Past Surgical History:  Procedure Laterality Date   ANTERIOR CERVICAL DECOMP/DISCECTOMY FUSION N/A 09/10/2013   Procedure: ANTERIOR CERVICAL DECOMPRESSION/DISCECTOMY FUSION CERVICAL FOUR-FIVE,CERVICAL FIVE-SIX WITH PLATING AND BONE GRAFT;  Surgeon: Karn Cassis, MD;  Location: [facility];  Service: Neurosurgery;  Laterality: N/A;   ANTERIOR CERVICAL DECOMP/DISCECTOMY FUSION N/A 03/14/2015   Procedure: ANTERIOR CERVICAL DECOMPRESSION/DISCECTOMY FUSION  CERVICAL TWO-THREE;  Surgeon: Hilda Lias, MD;  Location: [facility];  Service: Neurosurgery;  Laterality: N/A;   ANTERIOR CERVICAL DECOMP/DISCECTOMY FUSION N/A 10/03/2020   Procedure: Anterior Cervial Discectomy and Fusion Cervical three and cervical four;  Surgeon: Julio Sicks, MD;  Location: [facility];  Service: Neurosurgery;  Laterality: N/A;  3C   CARDIAC CATHETERIZATION     COLONOSCOPY     COLONOSCOPY W/ BIOPSIES AND POLYPECTOMY     Hx: of   LEFT HEART CATH AND CORONARY ANGIOGRAPHY N/A 11/04/2018   Procedure: LEFT HEART CATH AND CORONARY ANGIOGRAPHY;  Surgeon: Orpah Cobb, MD;  Location: [facility];  Service: Cardiovascular;  Laterality: N/A;   LEFT HEART CATH AND CORONARY ANGIOGRAPHY N/A 01/15/2023   Procedure: LEFT HEART CATH AND CORONARY ANGIOGRAPHY;  Surgeon: Marykay Lex, MD;   Location: [facility];  Service: Cardiovascular;  Laterality: N/A;   lumbar surgury     dr botero/ns; 3 level fusion per pt   POLYPECTOMY     s/p cervical disease x 2      No Known Allergies  Allergies as of 06/18/2023   No Known Allergies      Medication List        Accurate as of June 18, 2023 10:51 AM. If you have any questions, ask your nurse or doctor.          STOP taking these medications    ezetimibe 10 MG tablet Commonly known as: ZETIA Stopped by: Donalee Citrin Heatherly Stenner       TAKE these medications    HYDROcodone-acetaminophen 10-325 MG tablet Commonly known as: NORCO Take 1 tablet by mouth every 6 (six) hours as needed for moderate pain. What changed: when to take this   levothyroxine 125 MCG tablet Commonly known as: SYNTHROID Take 125 mcg by mouth daily.   nitroGLYCERIN 0.4 MG SL tablet Commonly known as: NITROSTAT DISSOLVE 1 TABLET UNDER TONGUE AT ONSET OF CHEST PAIN. MAY REPEAT DOSE EVERY 5 MINUTES UP TO 3 DOSES AS NEEDED. IF PAIN NOT RELIEVED, CALL 911. Strength: 0.4 mg   pregabalin 200 MG capsule Commonly known as: LYRICA Take 200 mg by mouth at bedtime.   ramipril 5 MG capsule Commonly known as: ALTACE Take 1 capsule (5 mg total) by mouth daily.   rosuvastatin 40 MG tablet Commonly known as: CRESTOR Take 1 tablet (40 mg total) by mouth daily.   vitamin B-12 250 MCG tablet Commonly known as: CYANOCOBALAMIN Take 500 mcg by mouth daily.   zolpidem 5 MG tablet Commonly known as: AMBIEN Take 5 mg by mouth See admin instructions. Every other night        Review of Systems  Constitutional:  Positive for fatigue. Negative for appetite change, chills, fever and unexpected weight change.  HENT:  Negative for congestion, dental problem, ear discharge, ear pain, facial swelling, hearing loss, nosebleeds, postnasal drip, rhinorrhea, sinus pressure, sinus pain, sneezing, sore throat, tinnitus and trouble swallowing.   Eyes:  Negative for  pain, discharge, redness, itching and visual disturbance.  Respiratory:  Negative for cough, chest tightness, shortness of breath and wheezing.   Cardiovascular:  Negative for chest pain, palpitations and leg swelling.  Gastrointestinal:  Negative for abdominal distention, abdominal pain, blood in stool, constipation, diarrhea, nausea and vomiting.  Endocrine: Negative for cold intolerance, heat intolerance, polydipsia, polyphagia and polyuria.  Genitourinary:  Positive for frequency and urgency. Negative for difficulty urinating, dysuria and flank pain.       Follows up with urologist   Musculoskeletal:  Positive for arthralgias, back pain and neck pain. Negative for gait problem, joint swelling, myalgias and neck stiffness.  Skin:  Negative for color change, pallor, rash and wound.  Neurological:  Negative for dizziness, syncope, speech difficulty, weakness, light-headedness, numbness and headaches.  Hematological:  Does not bruise/bleed easily.  Psychiatric/Behavioral:  Negative for agitation, behavioral problems, confusion, hallucinations, self-injury, sleep disturbance and suicidal ideas. The patient is not nervous/anxious.     Immunization History  Administered Date(s) Administered   PFIZER(Purple Top)SARS-COV-2 Vaccination 08/16/2020, 09/06/2020   Td 02/10/2018  Pertinent  Health Maintenance Due  Topic Date Due   Colonoscopy  04/17/2022   INFLUENZA VACCINE  12/30/2025 (Originally 07/03/2023)      10/03/2020    6:54 AM 10/03/2020   11:30 AM 02/28/2022    9:51 AM 04/16/2022    8:59 AM 06/18/2023   10:03 AM  Fall Risk  Falls in the past year?    0 0  Was there an injury with Fall?     0  Fall Risk Category Calculator     0  (RETIRED) Patient Fall Risk Level Moderate fall risk Moderate fall risk Low fall risk    Patient at Risk for Falls Due to     No Fall Risks  Fall risk Follow up     Falls evaluation completed   Functional Status Survey: Is the patient deaf or have difficulty  hearing?: No Does the patient have difficulty seeing, even when wearing glasses/contacts?: No Does the patient have difficulty concentrating, remembering, or making decisions?: No Does the patient have difficulty walking or climbing stairs?: No Does the patient have difficulty dressing or bathing?: No Does the patient have difficulty doing errands alone such as visiting a doctor's office or shopping?: No  Vitals:   06/18/23 1003  BP: 92/64  Pulse: 67  Resp: 16  SpO2: 97%  Weight: 172 lb 3.2 oz (78.1 kg)  Height: 5' 9.88" (1.775 m)   Body mass index is 24.79 kg/m. Physical Exam Vitals reviewed.  Constitutional:      General: He is not in acute distress.    Appearance: Normal appearance. He is normal weight. He is not ill-appearing or diaphoretic.  HENT:     Head: Normocephalic.     Right Ear: Tympanic membrane, ear canal and external ear normal. There is no impacted cerumen.     Left Ear: There is impacted cerumen.     Ears:     Comments: Left ear cerumen lavaged with warm water and hydrogen peroxide moderate amounts of cerumen obtained no instrument used.Tolerated procedure well.TM clear without any signs of infection.     Nose: Nose normal. No congestion or rhinorrhea.     Mouth/Throat:     Mouth: Mucous membranes are moist.     Pharynx: Oropharynx is clear. No oropharyngeal exudate or posterior oropharyngeal erythema.  Eyes:     General: No scleral icterus.       Right eye: No discharge.        Left eye: No discharge.     Extraocular Movements: Extraocular movements intact.     Conjunctiva/sclera: Conjunctivae normal.     Pupils: Pupils are equal, round, and reactive to light.  Neck:     Vascular: No carotid bruit.  Cardiovascular:     Rate and Rhythm: Normal rate and regular rhythm.     Pulses: Normal pulses.     Heart sounds: Normal heart sounds. No murmur heard.    No friction rub. No gallop.  Pulmonary:     Effort: Pulmonary effort is normal. No respiratory  distress.     Breath sounds: Normal breath sounds. No wheezing, rhonchi or rales.  Chest:     Chest wall: No tenderness.  Abdominal:     General: Bowel sounds are normal. There is no distension.     Palpations: Abdomen is soft. There is no mass.     Tenderness: There is no abdominal tenderness. There is no right CVA tenderness, left CVA tenderness, guarding or rebound.  Musculoskeletal:  General: No swelling or tenderness. Normal range of motion.     Cervical back: Normal range of motion. No rigidity or tenderness.     Right lower leg: No edema.     Left lower leg: No edema.  Lymphadenopathy:     Cervical: No cervical adenopathy.  Skin:    General: Skin is warm and dry.     Coloration: Skin is not pale.     Findings: No bruising, erythema, lesion or rash.  Neurological:     Mental Status: He is alert and oriented to person, place, and time.     Cranial Nerves: No cranial nerve deficit.     Sensory: No sensory deficit.     Motor: No weakness.     Coordination: Coordination normal.     Gait: Gait normal.  Psychiatric:        Mood and Affect: Mood normal.        Speech: Speech normal.        Behavior: Behavior normal.        Thought Content: Thought content normal.        Judgment: Judgment normal.     Labs reviewed: Recent Labs    01/14/23 1108 03/18/23 2248  NA 139 136  K 4.5 3.8  CL 102 105  CO2 25 25  GLUCOSE 92 98  BUN 20 21  CREATININE 0.99 1.15  CALCIUM 9.7 8.8*   Recent Labs    03/18/23 2248  AST 20  ALT 15  ALKPHOS 39  BILITOT 0.5  PROT 7.2  ALBUMIN 3.8   Recent Labs    01/14/23 1108 03/18/23 2248  WBC 8.3 7.0  HGB 14.2 12.4*  HCT 42.2 37.8*  MCV 88 90.9  PLT 287 259   Lab Results  Component Value Date   TSH 0.666 01/14/2023   Lab Results  Component Value Date   HGBA1C 5.4 01/24/2020   Lab Results  Component Value Date   CHOL 103 02/01/2022   HDL 49 02/01/2022   LDLCALC 43 02/01/2022   TRIG 54 02/01/2022   CHOLHDL 2.1  02/01/2022    Significant Diagnostic Results in last 30 days:  No results found.  Assessment/Plan  1. Essential hypertension Soft blood pressure -Advised to monitor blood pressure at home notify provider for any blood pressure less than 90/60. - TSH - COMPLETE METABOLIC PANEL WITH GFR - CBC with Differential/Platelet  2. Mixed hyperlipidemia Previous LDL at goal -Continue on rosuvastatin no side effects reported. -Dietary modification and exercise advised - Lipid panel  3. Aortic atherosclerosis ([facility]CC) Noted on CT scan -Continue on rosuvastatin  4. Coronary artery disease involving native coronary artery of native heart without angina pectoris Chest pain-free -Continue on rosuvastatin -Not on aspirin unclear why.Will evaluate cardiologist notes -Continue to follow-up with cardiology  5. Cervical spondylosis with radiculopathy Chronic pain -Continue on hydrocodone and pregabalin  6. Fatigue, unspecified type Unclear etiology though states recently diagnosed with hypothyroidism has not taken levothyroxine as prescribed -Will recheck TSH level -Advised to start on levothyroxine last advised by previous PCP -Will rule out other metabolic etiologies - Vitamin E95  7. Urine frequency Afebrile -Suspect due to prostate but will rule out urinary tract infection - PSA, Total and Free - Urine Culture  8. Impacted cerumen of left ear Left ear cerumen lavaged with warm water and hydrogen peroxide moderate amounts of cerumen obtained no instrument used.Tolerated procedure well.TM clear without any signs of infection.  Family/ staff Communication: Reviewed plan of care with  patient verbalized understanding  Labs/tests ordered:  - PSA, Total and Free - Urine Culture - Vitamin B 12 - Lipid panel - TSH - COMPLETE METABOLIC PANEL WITH GFR - CBC with Differential/Platelet  Next Appointment : Return in about 6 months (around 12/19/2023) for medical mangement of chronic  issues.Caesar Bookman, NP

## 2023-06-20 ENCOUNTER — Other Ambulatory Visit: Payer: Self-pay | Admitting: Family

## 2023-06-20 DIAGNOSIS — N3000 Acute cystitis without hematuria: Secondary | ICD-10-CM

## 2023-06-20 LAB — COMPLETE METABOLIC PANEL WITH GFR
AG Ratio: 1.5 (calc) (ref 1.0–2.5)
ALT: 15 U/L (ref 9–46)
AST: 15 U/L (ref 10–35)
Albumin: 4.3 g/dL (ref 3.6–5.1)
Alkaline phosphatase (APISO): 43 U/L (ref 35–144)
BUN: 21 mg/dL (ref 7–25)
CO2: 28 mmol/L (ref 20–32)
Calcium: 9.3 mg/dL (ref 8.6–10.3)
Chloride: 104 mmol/L (ref 98–110)
Creat: 0.9 mg/dL (ref 0.70–1.28)
Globulin: 2.9 g/dL (calc) (ref 1.9–3.7)
Glucose, Bld: 88 mg/dL (ref 65–99)
Potassium: 4.6 mmol/L (ref 3.5–5.3)
Sodium: 140 mmol/L (ref 135–146)
Total Bilirubin: 0.3 mg/dL (ref 0.2–1.2)
Total Protein: 7.2 g/dL (ref 6.1–8.1)
eGFR: 92 mL/min/{1.73_m2} (ref >=60)

## 2023-06-20 LAB — LIPID PANEL
Cholesterol: 202 mg/dL — ABNORMAL HIGH (ref <200)
HDL: 58 mg/dL (ref >=40)
LDL Cholesterol (Calc): 123 mg/dL (calc) — ABNORMAL HIGH
Non-HDL Cholesterol (Calc): 144 mg/dL (calc) — ABNORMAL HIGH (ref <130)
Total CHOL/HDL Ratio: 3.5 (calc) (ref <5.0)
Triglycerides: 104 mg/dL (ref <150)

## 2023-06-20 LAB — CBC WITH DIFFERENTIAL/PLATELET
Absolute Monocytes: 612 cells/uL (ref 200–950)
Basophils Absolute: 41 cells/uL (ref 0–200)
Basophils Relative: 0.6 %
Eosinophils Absolute: 102 cells/uL (ref 15–500)
Eosinophils Relative: 1.5 %
HCT: 40.1 % (ref 38.5–50.0)
Hemoglobin: 13.1 g/dL — ABNORMAL LOW (ref 13.2–17.1)
Lymphs Abs: 1394 cells/uL (ref 850–3900)
MCH: 29.4 pg (ref 27.0–33.0)
MCHC: 32.7 g/dL (ref 32.0–36.0)
MCV: 90.1 fL (ref 80.0–100.0)
MPV: 10.2 fL (ref 7.5–12.5)
Monocytes Relative: 9 %
Neutro Abs: 4651 cells/uL (ref 1500–7800)
Neutrophils Relative %: 68.4 %
Platelets: 254 10*3/uL (ref 140–400)
RBC: 4.45 10*6/uL (ref 4.20–5.80)
RDW: 13.2 % (ref 11.0–15.0)
Total Lymphocyte: 20.5 %
WBC: 6.8 10*3/uL (ref 3.8–10.8)

## 2023-06-20 LAB — URINE CULTURE
MICRO NUMBER:: 15213127
SPECIMEN QUALITY:: ADEQUATE

## 2023-06-20 LAB — VITAMIN B12: Vitamin B-12: >2000 pg/mL — ABNORMAL HIGH (ref 200–1100)

## 2023-06-20 LAB — PSA, TOTAL AND FREE
PSA, % Free: 25 % (calc) — ABNORMAL LOW (ref >25)
PSA, Free: 0.2 ng/mL
PSA, Total: 0.8 ng/mL (ref <=4.0)

## 2023-06-20 LAB — TSH: TSH: 0.77 mIU/L (ref 0.40–4.50)

## 2023-06-20 MED ORDER — CIPROFLOXACIN HCL 500 MG PO TABS: 500.0000 mg | ORAL_TABLET | Freq: Two times a day (BID) | ORAL | 0 refills | Status: AC

## 2023-06-20 NOTE — Progress Notes (Signed)
Cipro 500 mg tablet prescription send to pharmacy for UTI

## 2023-06-23 ENCOUNTER — Other Ambulatory Visit: Payer: Self-pay

## 2023-07-04 ENCOUNTER — Encounter: Payer: Medicare HMO | Admitting: Family Medicine

## 2023-07-09 NOTE — Progress Notes (Deleted)
HPI: FU CAD.  Previously followed by Dr. Algie Coffer. Cardiac catheterization December 2019 showed 25% proximal LAD. Abdominal ultrasound February 2021 showed greater than 50% stenosis in the left common iliac and external iliac arteries but no abdominal aortic aneurysm.  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 Coronary CTA 4/23 showed Ca score 1011 (86 percentile), severe stenosis in small, nondominant RCA and mild disease elsewhere. Echo 7/23 showed normal LV function, mild LVH. Cath 2/14 showed EF 55-65, 45 LAD, 55 RCA, normal LVEDP. Since last seen   Current Outpatient Medications  Medication Sig Dispense Refill   HYDROcodone-acetaminophen (NORCO) 10-325 MG tablet Take 1 tablet by mouth every 6 (six) hours as needed for moderate pain. (Patient taking differently: Take 1 tablet by mouth every 6 (six) hours.) 40 tablet 0   levothyroxine (SYNTHROID) 125 MCG tablet Take 125 mcg by mouth daily. (Patient not taking: Reported on 06/18/2023)     nitroGLYCERIN (NITROSTAT) 0.4 MG SL tablet DISSOLVE 1 TABLET UNDER TONGUE AT ONSET OF CHEST PAIN. MAY REPEAT DOSE EVERY 5 MINUTES UP TO 3 DOSES AS NEEDED. IF PAIN NOT RELIEVED, CALL 911. Strength: 0.4 mg (Patient not taking: Reported on 06/18/2023) 25 tablet 3   pregabalin (LYRICA) 200 MG capsule Take 200 mg by mouth at bedtime.     Probiotic Product (PROBIOTIC DAILY PO) Take 1 tablet by mouth daily.     ramipril (ALTACE) 5 MG capsule Take 1 capsule (5 mg total) by mouth daily. 90 capsule 3   rosuvastatin (CRESTOR) 40 MG tablet Take 1 tablet (40 mg total) by mouth daily. 90 tablet 3   vitamin B-12 (CYANOCOBALAMIN) 250 MCG tablet Take 250 mcg by mouth daily.     zolpidem (AMBIEN) 5 MG tablet Take 5 mg by mouth See admin instructions. Every other night     No current facility-administered medications for this visit.     Past Medical History:  Diagnosis Date   Abnormal gait    Allergic rhinitis, cause unspecified 07/18/2008   Back pain, chronic  10/01/2011   CAD (coronary artery disease)    Carpal tunnel syndrome on left    Chronic low back pain 12/25/2012   Degenerative arthritis of right knee 10/01/2011   Diverticulosis of colon (without mention of hemorrhage) 07/18/2008   Gross hematuria 07/18/2008   Headache(784.0)    Hx: of Migraines as a child   Hyperlipidemia 07/18/2008   Hypertension    Idiopathic progressive neuropathy    Personal history of colonic polyps 07/18/2008   PVD (peripheral vascular disease) (HCC)    Tobacco use disorder 04/06/2009    Past Surgical History:  Procedure Laterality Date   ANTERIOR CERVICAL DECOMP/DISCECTOMY FUSION N/A 09/10/2013   Procedure: ANTERIOR CERVICAL DECOMPRESSION/DISCECTOMY FUSION CERVICAL FOUR-FIVE,CERVICAL FIVE-SIX WITH PLATING AND BONE GRAFT;  Surgeon: Karn Cassis, MD;  Location: [facility];  Service: Neurosurgery;  Laterality: N/A;   ANTERIOR CERVICAL DECOMP/DISCECTOMY FUSION N/A 03/14/2015   Procedure: ANTERIOR CERVICAL DECOMPRESSION/DISCECTOMY FUSION  CERVICAL TWO-THREE;  Surgeon: Hilda Lias, MD;  Location: [facility];  Service: Neurosurgery;  Laterality: N/A;   ANTERIOR CERVICAL DECOMP/DISCECTOMY FUSION N/A 10/03/2020   Procedure: Anterior Cervial Discectomy and Fusion Cervical three and cervical four;  Surgeon: Julio Sicks, MD;  Location: [facility];  Service: Neurosurgery;  Laterality: N/A;  3C   CARDIAC CATHETERIZATION     COLONOSCOPY     COLONOSCOPY W/ BIOPSIES AND POLYPECTOMY     Hx: of   LEFT HEART CATH AND CORONARY ANGIOGRAPHY N/A  11/04/2018   Procedure: LEFT HEART CATH AND CORONARY ANGIOGRAPHY;  Surgeon: Orpah Cobb, MD;  Location: [facility] IN[facility] CV [facility];  Service: Cardiovascular;  Laterality: N/A;   LEFT HEART CATH AND CORONARY ANGIOGRAPHY N/A 01/15/2023   Procedure: LEFT HEART CATH AND CORONARY ANGIOGRAPHY;  Surgeon: Marykay Lex, MD;  Location: [facility] INVASIVE CV LAB;  Service: Cardiovascular;  Laterality: N/A;   lumbar surgury     dr botero/ns; 3 level fusion  per pt   POLYPECTOMY     s/p cervical disease x 2      Social History   Socioeconomic History   Marital status: Married    Spouse name: [name]   Number of children: 1   Years of education: Not on file   Highest education level: High school graduate  Occupational History   Occupation: Surveyor, minerals and Midwife    Comment: semi retired  Tobacco Use   Smoking status: Former    Current packs/day: 0.50    Average packs/day: 0.5 packs/day for 40.0 years (20.0 ttl pk-yrs)    Types: Cigarettes   Smokeless tobacco: Never  Vaping Use   Vaping status: Never Used  Substance and Sexual Activity   Alcohol use: Yes    Alcohol/week: 2.0 standard drinks of alcohol    Types: 2 Cans of beer per week    Comment: occasional   Drug use: No   Sexual activity: Not Currently  Other Topics Concern   Not on file  Social History Narrative   Married, 1 daughter lives close by.   Spends time at [employer] in [place].   Social Determinants of Health   Financial Resource Strain: Not on file  Food Insecurity: Not on file  Transportation Needs: Not on file  Physical Activity: Not on file  Stress: Not on file  Social Connections: Not on file  Intimate Partner Violence: Not on file    Family History  Problem Relation Age of Onset   Heart disease Mother    Heart attack Father    Heart disease Brother    Parkinsonism Paternal Uncle    Heart disease Other    Colon cancer Neg Hx    Colon polyps Neg Hx    Esophageal cancer Neg Hx    Rectal cancer Neg Hx    Stomach cancer Neg Hx     ROS: no fevers or chills, productive cough, hemoptysis, dysphasia, odynophagia, melena, hematochezia, dysuria, hematuria, rash, seizure activity, orthopnea, PND, pedal edema, claudication. Remaining systems are negative.  Physical Exam: Well-developed well-nourished in no acute distress.  Skin is warm and dry.  HEENT is normal.  Neck is supple.  Chest is clear to auscultation with normal expansion.   Cardiovascular exam is regular rate and rhythm.  Abdominal exam nontender or distended. No masses palpated. Extremities show no edema. neuro grossly intact  ECG- personally reviewed  A/P  1 coronary artery disease-continue ASA and statin.   2 hypertension-blood pressure controlled.  Continue present medical therapy.    3 hyperlipidemia-continue statin.    4 history of tobacco abuse-patient counseled on discontinuing.   5 peripheral vascular disease-he denies claudication.  Continue aspirin and statin.   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  Olga Millers, MD

## 2023-07-09 NOTE — Progress Notes (Unsigned)
HPI: [name] is a 72 y.o. male, who is here today to establish care.  Former PCP: Dr Donald Roberts Last preventive routine visit: 01/14/2023.  Today he is c/o persistent fatigue for the past 2-3 years. He reports low energy levels since late 2020, after receiving the COVID 19 vaccine. 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 Denies any history of depression or anxiety.  He has poor sleep, averaging only 2 hours per night, due to frequent nocturia, needing to urinate every 45 minutes. He has seen 3 different urologists who stated that the walls of his bladder are too thick, but they have been unable to help him. He has tried different medications, that were ineffective.Had normal prostate exam in July 2024. He is reluctant to wear diapers at night.  Denies history of sleep apnea and had a sleep study with no significant findings.   He has been prescribed Ambien 5 mg, which he tried once, stopped because had urine incontinence while he was asleep. He has not tried other medication for sleep.  He has a history of CAD,aortic atherosclerosis,HLD, and HTN. Former smoker, quit smoking in December 2023 after 40 years of smoking a pack a day.  Occasional alcohol use, not daily or monthly.  Currently on Ramipril 5 mg daily. Home BP's 120's/70's. Lab Results  Component Value Date   NA 140 06/18/2023   CL 104 06/18/2023   K 4.6 06/18/2023   CO2 28 06/18/2023   BUN 21 06/18/2023   CREATININE 0.90 06/18/2023   EGFR 92 06/18/2023   CALCIUM 9.3 06/18/2023   ALBUMIN 3.8 03/18/2023   GLUCOSE 88 06/18/2023   Lab Results  Component Value Date   ALT 15 06/18/2023   AST 15 06/18/2023   ALKPHOS 39 03/18/2023   BILITOT 0.3 06/18/2023   Lab Results  Component Value Date   WBC 6.8 06/18/2023   HGB 13.1 (L) 06/18/2023   HCT 40.1 06/18/2023   MCV 90.1 06/18/2023   PLT 254 06/18/2023   Lab Results  Component Value Date   TSH 0.77 06/18/2023   Chronic  pain, he is on  Hydrocodone-Acetaminophen 10-325 mg q 4 hours Hx of upper back pain , has had 6 back surgeries. Peripheral neuropathy, lower extremities burning,tingling,and numbness. He is on Lyrica 200 mg 1-2 times per day, prescribed by his podiatrist.  B12 deficiency: Last Vitamin B12 levels were above normal, has since reduced B12 supplementation.  Lab Results  Component Value Date   VITAMINB12 >2,000 (H) 06/18/2023   He reports shortness of breath with walking, he checks pulse O2 at home and usually 96%. No associated CP,palpitation,or diaphoresis. This has been going on for a couple of years. He follows with cardiologist regularly, Dr Donald Roberts. Left heart cath done on 01/15/23.  Chest CTA in 01/2022:Moderate bilateral centrilobular cystic emphysematous changes. Negative for associated cough or wheezing.  Review of Systems  Constitutional:  Positive for fatigue. Negative for activity change, appetite change, fever and unexpected weight change.  HENT:  Negative for mouth sores, sore throat and trouble swallowing.   Eyes:  Negative for redness and visual disturbance.  Cardiovascular:  Negative for palpitations and leg swelling.  Gastrointestinal:  Negative for abdominal pain, nausea and vomiting.  Endocrine: Negative for cold intolerance and heat intolerance.  Genitourinary:  Negative for decreased urine volume, dysuria and hematuria.  Neurological:  Negative for syncope, weakness and headaches.  Psychiatric/Behavioral:  Negative for confusion and hallucinations.   See other  pertinent positives and negatives in HPI.  Current Outpatient Medications on File Prior to Visit  Medication Sig Dispense Refill   HYDROcodone-acetaminophen (NORCO) 10-325 MG tablet Take 1 tablet by mouth every 6 (six) hours as needed for moderate pain. (Patient taking differently: Take 1 tablet by mouth every 6 (six) hours.) 40 tablet 0   nitroGLYCERIN (NITROSTAT) 0.4 MG SL tablet DISSOLVE 1 TABLET UNDER TONGUE AT  ONSET OF CHEST PAIN. MAY REPEAT DOSE EVERY 5 MINUTES UP TO 3 DOSES AS NEEDED. IF PAIN NOT RELIEVED, CALL 911. Strength: 0.4 mg 25 tablet 3   pregabalin (LYRICA) 200 MG capsule Take 200 mg by mouth at bedtime.     ramipril (ALTACE) 5 MG capsule Take 1 capsule (5 mg total) by mouth daily. 90 capsule 3   vitamin B-12 (CYANOCOBALAMIN) 250 MCG tablet Take 250 mcg by mouth daily.     levothyroxine (SYNTHROID) 125 MCG tablet Take 125 mcg by mouth daily. (Patient not taking: Reported on 07/11/2023)     Probiotic Product (PROBIOTIC DAILY PO) Take 1 tablet by mouth daily. (Patient not taking: Reported on 07/11/2023)     rosuvastatin (CRESTOR) 40 MG tablet Take 1 tablet (40 mg total) by mouth daily. 90 tablet 3   zolpidem (AMBIEN) 5 MG tablet Take 5 mg by mouth See admin instructions. Every other night (Patient not taking: Reported on 07/11/2023)     No current facility-administered medications on file prior to visit.    Past Medical History:  Diagnosis Date   Abnormal gait    Allergic rhinitis, cause unspecified 07/18/2008   Back pain, chronic 10/01/2011   CAD (coronary artery disease)    Carpal tunnel syndrome on left    Chronic low back pain 12/25/2012   Degenerative arthritis of right knee 10/01/2011   Diverticulosis of colon (without mention of hemorrhage) 07/18/2008   Gross hematuria 07/18/2008   Headache(784.0)    Hx: of Migraines as a child   Hyperlipidemia 07/18/2008   Hypertension    Idiopathic progressive neuropathy    Personal history of colonic polyps 07/18/2008   PVD (peripheral vascular disease) (HCC)    Tobacco use disorder 04/06/2009   No Known Allergies  Family History  Problem Relation Age of Onset   Heart disease Mother    Heart attack Father    Heart disease Father    Cancer Sister    Diabetes Sister    Heart disease Sister    Heart disease Brother    Parkinsonism Paternal Uncle    Heart disease Other    Colon cancer Neg Hx    Colon polyps Neg Hx    Esophageal  cancer Neg Hx    Rectal cancer Neg Hx    Stomach cancer Neg Hx     Social History   Socioeconomic History   Marital status: Married    Spouse name: Donald Roberts   Number of children: 1   Years of education: Not on file   Highest education level: High school graduate  Occupational History   Occupation: Surveyor, minerals and Midwife    Comment: semi retired  Tobacco Use   Smoking status: Former    Current packs/day: 0.50    Average packs/day: 0.5 packs/day for 40.0 years (20.0 ttl pk-yrs)    Types: Cigarettes   Smokeless tobacco: Never  Vaping Use   Vaping status: Never Used  Substance and Sexual Activity   Alcohol use: Yes    Alcohol/week: 2.0 standard drinks of alcohol    Types: 2 Cans of  beer per week    Comment: occasional   Drug use: No   Sexual activity: Not Currently  Other Topics Concern   Not on file  Social History Narrative   Married, 1 daughter lives close by.   Spends time at [employer] in [place].   Social Determinants of Health   Financial Resource Strain: Not on file  Food Insecurity: Not on file  Transportation Needs: Not on file  Physical Activity: Not on file  Stress: Not on file  Social Connections: Not on file   Vitals:   07/11/23 1027  BP: 104/76  Pulse: 71  Resp: 16  Temp: (!) 97.5 F (36.4 C)  SpO2: 97%   Body mass index is 23.61 kg/m.  Physical Exam Vitals and nursing note reviewed.  Constitutional:      General: He is not in acute distress.    Appearance: He is well-developed.  HENT:     Head: Normocephalic and atraumatic.  Eyes:     Conjunctiva/sclera: Conjunctivae normal.  Cardiovascular:     Rate and Rhythm: Normal rate and regular rhythm.     Pulses:          Dorsalis pedis pulses are 2+ on the right side and 2+ on the left side.     Heart sounds: No murmur heard. Pulmonary:     Effort: Pulmonary effort is normal. No respiratory distress.     Breath sounds: Normal breath sounds.  Abdominal:     Palpations: Abdomen is  soft. There is no hepatomegaly or mass.     Tenderness: There is no abdominal tenderness.  Lymphadenopathy:     Cervical: No cervical adenopathy.  Skin:    General: Skin is warm.     Findings: No erythema or rash.  Neurological:     Mental Status: He is alert and oriented to person, place, and time.     Cranial Nerves: No cranial nerve deficit.     Comments: Mildly unstable gait, not assisted.  Psychiatric:        Mood and Affect: Mood and affect normal.   ASSESSMENT AND PLAN:  Mr. Moishe was seen today for establish care.  Diagnoses and all orders for this visit:  Fatigue, unspecified type Assessment & Plan: We discussed possible etiologies. According to patient, he has had extensive workup, including a sleep study, otherwise negative. Lack of a good sleep is certainly a major contributing factor. BP on lower normal range, instructed to monitor BP at home, if SBP persistently in the low 100s, we could try decreasing ramipril dose. Also some of his medications can aggravate the problem.  Hoping that if he can improve his sleep, problem will improve.   Urinary frequency Assessment & Plan: Chronic. Reports that he has tried several pharmacologic treatments but they were not effective. Problem is specially bothersome at night,affecting his sleep. Following with urologist.   Chronic insomnia Assessment & Plan: Nocturia is an aggravating factor. Ambien helped when he tried x 1, we discussed side effects. He agrees with trying trazodone instead, starting with 25 mg and titrating up to 50 mg in 1 week if needed.  We discussed some side effects. Good sleep hygiene also recommended, consider wearing night time pull ups. He will let me know in a few weeks if trazodone is effective.  Orders: -     traZODone HCl; Take 1 tablet (50 mg total) by mouth at bedtime.  Dispense: 30 tablet; Refill: 3  Cervical myelopathy (HCC) Assessment & Plan: Following with  pain  management. Currently on Hydrocodone-Acetaminophen 10-325 mg q 3-4 hours and Lyrica 200 mg 1-2 times per day (also for peripheral neuropathy.   I spent a total of 39 minutes in both face to face and non face to face activities for this visit on the date of this encounter. During this time history was obtained and documented, examination was performed, prior labs/imaging reviewed, and assessment/plan discussed.  Return in about 1 year (around 07/10/2024), or if symptoms worsen or fail to improve, for CPE.  Brailyn Delman G. [place], MD  [facility]. [place] office.

## 2023-07-11 ENCOUNTER — Ambulatory Visit (INDEPENDENT_AMBULATORY_CARE_PROVIDER_SITE_OTHER): Payer: Medicare HMO | Admitting: Family Medicine

## 2023-07-11 ENCOUNTER — Encounter: Payer: Self-pay | Admitting: Family Medicine

## 2023-07-11 VITALS — BP 104/76 | HR 71 | Temp 97.5°F | Resp 16 | Ht 69.88 in | Wt 164.0 lb

## 2023-07-11 DIAGNOSIS — R350 Frequency of micturition: Secondary | ICD-10-CM | POA: Insufficient documentation

## 2023-07-11 DIAGNOSIS — G959 Disease of spinal cord, unspecified: Secondary | ICD-10-CM

## 2023-07-11 DIAGNOSIS — R5383 Other fatigue: Secondary | ICD-10-CM

## 2023-07-11 DIAGNOSIS — F5104 Psychophysiologic insomnia: Secondary | ICD-10-CM | POA: Diagnosis not present

## 2023-07-11 MED ORDER — TRAZODONE HCL 50 MG PO TABS: 25.0000 mg | ORAL_TABLET | Freq: Every evening | ORAL | 3 refills | Status: DC | PRN

## 2023-07-11 MED ORDER — TRAZODONE HCL 50 MG PO TABS: 50.0000 mg | ORAL_TABLET | Freq: Every day | ORAL | 3 refills | Status: DC

## 2023-07-11 NOTE — Assessment & Plan Note (Addendum)
Nocturia is an aggravating factor. Ambien helped when he tried x 1, we discussed side effects. He agrees with trying trazodone instead, starting with 25 mg and titrating up to 50 mg in 1 week if needed.  We discussed some side effects. Good sleep hygiene also recommended, consider wearing night time pull ups. He will let me know in a few weeks if trazodone is effective.

## 2023-07-11 NOTE — Patient Instructions (Signed)
A few things to remember from today's visit:  Fatigue, unspecified type  Urinary frequency  Try wearing night time diapers, so you get a better sleep. Monitor blood pressure at home, low blood pressure can also be causing fatigue.  If you need refills for medications you take chronically, please call your pharmacy. Do not use My Chart to request refills or for acute issues that need immediate attention. If you send a my chart message, it may take a few days to be addressed, specially if I am not in the office.  Please be sure medication list is accurate. If a new problem present, please set up appointment sooner than planned today.

## 2023-07-11 NOTE — Assessment & Plan Note (Addendum)
We discussed possible etiologies. According to patient, he has had extensive workup, including a sleep study, otherwise negative. Lack of a good sleep is certainly a major contributing factor. BP on lower normal range, instructed to monitor BP at home, if SBP persistently in the low 100s, we could try decreasing ramipril dose. Also some of his medications can aggravate the problem.  Hoping that if he can improve his sleep, problem will improve.

## 2023-07-12 NOTE — Assessment & Plan Note (Addendum)
Chronic. Reports that he has tried several pharmacologic treatments but they were not effective. Problem is specially bothersome at night,affecting his sleep. Following with urologist.

## 2023-07-12 NOTE — Assessment & Plan Note (Signed)
Following with pain management. Currently on Hydrocodone-Acetaminophen 10-325 mg q 3-4 hours and Lyrica 200 mg 1-2 times per day (also for peripheral neuropathy.

## 2023-07-14 NOTE — Progress Notes (Signed)
HPI: FU CAD.  Previously followed by Dr. Algie Coffer. Cardiac catheterization December 2019 showed 25% proximal LAD. Abdominal ultrasound February 2021 showed greater than 50% stenosis in the left common iliac and external iliac arteries but no abdominal aortic aneurysm.  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 Coronary CTA 4/23 showed Ca score 1011 (86 percentile), severe stenosis in small, nondominant RCA and mild disease elsewhere. Echo 7/23 showed normal LV function, mild LVH. Cath 01/15/23 showed EF 55-65, 45 LAD, 55 RCA, normal LVEDP. Since last seen patient denies dyspnea, chest pain, palpitations or syncope.  However he does complain of fatigue.  Current Outpatient Medications  Medication Sig Dispense Refill   HYDROcodone-acetaminophen (NORCO) 10-325 MG tablet Take 1 tablet by mouth every 6 (six) hours as needed for moderate pain. (Patient taking differently: Take 1 tablet by mouth every 6 (six) hours.) 40 tablet 0   levothyroxine (SYNTHROID) 125 MCG tablet Take 125 mcg by mouth daily.     nitroGLYCERIN (NITROSTAT) 0.4 MG SL tablet DISSOLVE 1 TABLET UNDER TONGUE AT ONSET OF CHEST PAIN. MAY REPEAT DOSE EVERY 5 MINUTES UP TO 3 DOSES AS NEEDED. IF PAIN NOT RELIEVED, CALL 911. Strength: 0.4 mg 25 tablet 3   pregabalin (LYRICA) 200 MG capsule Take 200 mg by mouth at bedtime.     Probiotic Product (PROBIOTIC DAILY PO) Take 1 tablet by mouth daily.     ramipril (ALTACE) 5 MG capsule Take 1 capsule (5 mg total) by mouth daily. 90 capsule 3   rosuvastatin (CRESTOR) 40 MG tablet Take 1 tablet (40 mg total) by mouth daily. 90 tablet 3   traZODone (DESYREL) 50 MG tablet Take 1 tablet (50 mg total) by mouth at bedtime. 30 tablet 3   No current facility-administered medications for this visit.     Past Medical History:  Diagnosis Date   Abnormal gait    Allergic rhinitis, cause unspecified 07/18/2008   Back pain, chronic 10/01/2011   CAD (coronary artery disease)    Carpal tunnel  syndrome on left    Chronic low back pain 12/25/2012   Degenerative arthritis of right knee 10/01/2011   Diverticulosis of colon (without mention of hemorrhage) 07/18/2008   Gross hematuria 07/18/2008   Headache(784.0)    Hx: of Migraines as a child   Hyperlipidemia 07/18/2008   Hypertension    Idiopathic progressive neuropathy    Personal history of colonic polyps 07/18/2008   PVD (peripheral vascular disease) (HCC)    Tobacco use disorder 04/06/2009    Past Surgical History:  Procedure Laterality Date   ANTERIOR CERVICAL DECOMP/DISCECTOMY FUSION N/A 09/10/2013   Procedure: ANTERIOR CERVICAL DECOMPRESSION/DISCECTOMY FUSION CERVICAL FOUR-FIVE,CERVICAL FIVE-SIX WITH PLATING AND BONE GRAFT;  Surgeon: Karn Cassis, MD;  Location: [facility];  Service: Neurosurgery;  Laterality: N/A;   ANTERIOR CERVICAL DECOMP/DISCECTOMY FUSION N/A 03/14/2015   Procedure: ANTERIOR CERVICAL DECOMPRESSION/DISCECTOMY FUSION  CERVICAL TWO-THREE;  Surgeon: Hilda Lias, MD;  Location: [facility];  Service: Neurosurgery;  Laterality: N/A;   ANTERIOR CERVICAL DECOMP/DISCECTOMY FUSION N/A 10/03/2020   Procedure: Anterior Cervial Discectomy and Fusion Cervical three and cervical four;  Surgeon: Julio Sicks, MD;  Location: [facility] OR;  Service: Neurosurgery;  Laterality: N/A;  3C   CARDIAC CATHETERIZATION     COLONOSCOPY     COLONOSCOPY W/ BIOPSIES AND POLYPECTOMY     Hx: of   LEFT HEART CATH AND CORONARY ANGIOGRAPHY N/A 11/04/2018   Procedure: LEFT HEART CATH AND CORONARY ANGIOGRAPHY;  Surgeon: Orpah Cobb,  MD;  Location: [facility];  Service: Cardiovascular;  Laterality: N/A;   LEFT HEART CATH AND CORONARY ANGIOGRAPHY N/A 01/15/2023   Procedure: LEFT HEART CATH AND CORONARY ANGIOGRAPHY;  Surgeon: Marykay Lex, MD;  Location: [facility] INVASIVE CV LAB;  Service: Cardiovascular;  Laterality: N/A;   lumbar surgury     dr botero/ns; 3 level fusion per pt   POLYPECTOMY     s/p cervical disease x 2       Social History   Socioeconomic History   Marital status: Married    Spouse name: [name]   Number of children: 1   Years of education: Not on file   Highest education level: High school graduate  Occupational History   Occupation: Surveyor, minerals and Midwife    Comment: semi retired  Tobacco Use   Smoking status: Former    Current packs/day: 0.50    Average packs/day: 0.5 packs/day for 40.0 years (20.0 ttl pk-yrs)    Types: Cigarettes   Smokeless tobacco: Never  Vaping Use   Vaping status: Never Used  Substance and Sexual Activity   Alcohol use: Yes    Alcohol/week: 2.0 standard drinks of alcohol    Types: 2 Cans of beer per week    Comment: occasional   Drug use: No   Sexual activity: Not Currently  Other Topics Concern   Not on file  Social History Narrative   Married, 1 daughter lives close by.   Spends time at [employer] in [place].   Social Determinants of Health   Financial Resource Strain: Not on file  Food Insecurity: Not on file  Transportation Needs: Not on file  Physical Activity: Not on file  Stress: Not on file  Social Connections: Not on file  Intimate Partner Violence: Not on file    Family History  Problem Relation Age of Onset   Heart disease Mother    Heart attack Father    Heart disease Father    Cancer Sister    Diabetes Sister    Heart disease Sister    Heart disease Brother    Parkinsonism Paternal Uncle    Heart disease Other    Colon cancer Neg Hx    Colon polyps Neg Hx    Esophageal cancer Neg Hx    Rectal cancer Neg Hx    Stomach cancer Neg Hx     ROS: no fevers or chills, productive cough, hemoptysis, dysphasia, odynophagia, melena, hematochezia, dysuria, hematuria, rash, seizure activity, orthopnea, PND, pedal edema, claudication. Remaining systems are negative.  Physical Exam: Well-developed well-nourished in no acute distress.  Skin is warm and dry.  HEENT is normal.  Neck is supple.  Chest is clear to auscultation  with normal expansion.  Cardiovascular exam is regular rate and rhythm.  Abdominal exam nontender or distended. No masses palpated. Extremities show no edema. neuro grossly intact  EKG Interpretation Date/Time:  Tuesday July 22 2023 16:06:16 EDT Ventricular Rate:  77 PR Interval:  178 QRS Duration:  88 QT Interval:  378 QTC Calculation: 427 R Axis:   -12  Text Interpretation: Normal sinus rhythm Septal infarct (cited on or before 18-Mar-2023) When compared with ECG of 18-Mar-2023 22:42, Questionable change in QRS axis T wave inversion no longer evident in Inferior leads Confirmed by Olga Millers ([id number]) on 07/22/2023 4:21:56 PM    A/P  1 coronary artery disease-continue ASA and statin.   2 hypertension-blood pressure controlled.  Continue present medical therapy.    3 hyperlipidemia-continue statin.  LDL not at goal.  Add Zetia 10 mg daily.  Check lipids and liver in 8 weeks.   4 history of tobacco abuse-patient discontinued in December.   5 peripheral vascular disease-he denies claudication.  Continue aspirin and statin.   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  7 fatigue-recent hemoglobin 13.1 and TSH normal.  He is not on a beta-blocker.  LV function is normal.  I have asked him to follow-up with primary care for this.  Olga Millers, MD

## 2023-07-17 ENCOUNTER — Ambulatory Visit: Payer: Medicare HMO | Admitting: Cardiology

## 2023-07-22 ENCOUNTER — Encounter: Payer: Self-pay | Admitting: Cardiology

## 2023-07-22 ENCOUNTER — Ambulatory Visit: Payer: Medicare HMO | Attending: Cardiology | Admitting: Cardiology

## 2023-07-22 ENCOUNTER — Ambulatory Visit: Payer: Medicare HMO | Admitting: Cardiology

## 2023-07-22 VITALS — BP 107/66 | HR 77 | Ht 70.0 in | Wt 164.4 lb

## 2023-07-22 DIAGNOSIS — I25119 Atherosclerotic heart disease of native coronary artery with unspecified angina pectoris: Secondary | ICD-10-CM

## 2023-07-22 DIAGNOSIS — R5383 Other fatigue: Secondary | ICD-10-CM | POA: Diagnosis not present

## 2023-07-22 DIAGNOSIS — I739 Peripheral vascular disease, unspecified: Secondary | ICD-10-CM | POA: Diagnosis not present

## 2023-07-22 DIAGNOSIS — I10 Essential (primary) hypertension: Secondary | ICD-10-CM

## 2023-07-22 DIAGNOSIS — E7800 Pure hypercholesterolemia, unspecified: Secondary | ICD-10-CM

## 2023-07-22 MED ORDER — EZETIMIBE 10 MG PO TABS: 10.0000 mg | ORAL_TABLET | Freq: Every day | ORAL | 3 refills | Status: DC

## 2023-07-22 NOTE — Patient Instructions (Signed)
Medication Instructions:   START EZETIMIBE 10 MG ONCE DAILY  *If you need a refill on your cardiac medications before your next appointment, please call your pharmacy*   Lab Work:  Your physician recommends that you return for lab work in: [place]  If you have labs (blood work) drawn today and your tests are completely normal, you will receive your results only by: [place] (if you have MyChart) OR A paper copy in the mail If you have any lab test that is abnormal or we need to change your treatment, we will call you to review the results.   Follow-Up: At [facility], you and your health needs are our priority.  As part of our continuing mission to provide you with exceptional heart care, we have created designated Provider Care Teams.  These Care Teams include your primary Cardiologist (physician) and Advanced Practice Providers (APPs -  Physician Assistants and Nurse Practitioners) who all work together to provide you with the care you need, when you need it.  We recommend signing up for the patient portal called "MyChart".  Sign up information is provided on this After Visit Summary.  MyChart is used to connect with patients for Virtual Visits (Telemedicine).  Patients are able to view lab/test results, encounter notes, upcoming appointments, etc.  Non-urgent messages can be sent to your provider as well.   To learn more about what you can do with MyChart, go to [email or web address].    Your next appointment:   12 month(s)  Provider:   Kirk Ruths, MD

## 2023-08-08 ENCOUNTER — Telehealth: Payer: Self-pay | Admitting: Family Medicine

## 2023-08-08 NOTE — Telephone Encounter (Signed)
I left patient a voicemail to return my call. 

## 2023-08-08 NOTE — Telephone Encounter (Signed)
Caitlyn with triage nurse called and stated pt is experiencing dizziness and blurred vision. Her recommendation was to be seen within 3-4 hours, I let her know we do not have anything left for today. She said he is refusing to go to urgent care. He does has an appointment on 08/11/23.

## 2023-08-08 NOTE — Progress Notes (Unsigned)
ACUTE VISIT No chief complaint on file.  HPI: Donald Roberts is a 72 y.o. male, who is here today complaining of *** HPI  Review of Systems See other pertinent positives and negatives in HPI.  Current Outpatient Medications on File Prior to Visit  Medication Sig Dispense Refill  . ezetimibe (ZETIA) 10 MG tablet Take 1 tablet (10 mg total) by mouth daily. 90 tablet 3  . HYDROcodone-acetaminophen (NORCO) 10-325 MG tablet Take 1 tablet by mouth every 6 (six) hours as needed for moderate pain. (Patient taking differently: Take 1 tablet by mouth every 6 (six) hours.) 40 tablet 0  . levothyroxine (SYNTHROID) 125 MCG tablet Take 125 mcg by mouth daily.    . nitroGLYCERIN (NITROSTAT) 0.4 MG SL tablet DISSOLVE 1 TABLET UNDER TONGUE AT ONSET OF CHEST PAIN. MAY REPEAT DOSE EVERY 5 MINUTES UP TO 3 DOSES AS NEEDED. IF PAIN NOT RELIEVED, CALL 911. Strength: 0.4 mg 25 tablet 3  . pregabalin (LYRICA) 200 MG capsule Take 200 mg by mouth at bedtime.    . Probiotic Product (PROBIOTIC DAILY PO) Take 1 tablet by mouth daily.    . ramipril (ALTACE) 5 MG capsule Take 1 capsule (5 mg total) by mouth daily. 90 capsule 3  . rosuvastatin (CRESTOR) 40 MG tablet Take 1 tablet (40 mg total) by mouth daily. 90 tablet 3  . traZODone (DESYREL) 50 MG tablet Take 1 tablet (50 mg total) by mouth at bedtime. 30 tablet 3   No current facility-administered medications on file prior to visit.    Past Medical History:  Diagnosis Date  . Abnormal gait   . Allergic rhinitis, cause unspecified 07/18/2008  . Back pain, chronic 10/01/2011  . CAD (coronary artery disease)   . Carpal tunnel syndrome on left   . Chronic low back pain 12/25/2012  . Degenerative arthritis of right knee 10/01/2011  . Diverticulosis of colon (without mention of hemorrhage) 07/18/2008  . Gross hematuria 07/18/2008  . Headache(784.0)    Hx: of Migraines as a child  . Hyperlipidemia 07/18/2008  . Hypertension   . Idiopathic progressive  neuropathy   . Personal history of colonic polyps 07/18/2008  . PVD (peripheral vascular disease) (HCC)   . Tobacco use disorder 04/06/2009   No Known Allergies  Social History   Socioeconomic History  . Marital status: Married    Spouse name: Donald Roberts  . Number of children: 1  . Years of education: Not on file  . Highest education level: High school graduate  Occupational History  . Occupation: Surveyor, minerals and Midwife    Comment: semi retired  Tobacco Use  . Smoking status: Former    Current packs/day: 0.50    Average packs/day: 0.5 packs/day for 40.0 years (20.0 ttl pk-yrs)    Types: Cigarettes  . Smokeless tobacco: Never  Vaping Use  . Vaping status: Never Used  Substance and Sexual Activity  . Alcohol use: Yes    Alcohol/week: 2.0 standard drinks of alcohol    Types: 2 Cans of beer per week    Comment: occasional  . Drug use: No  . Sexual activity: Not Currently  Other Topics Concern  . Not on file  Social History Narrative   Married, 1 daughter lives close by.   Spends time at [employer] in [place].   Social Determinants of Health   Financial Resource Strain: Not on file  Food Insecurity: Not on file  Transportation Needs: Not on file  Physical Activity: Not on file  Stress: Not on file  Social Connections: Not on file    There were no vitals filed for this visit. There is no height or weight on file to calculate BMI.  Physical Exam  ASSESSMENT AND PLAN: There are no diagnoses linked to this encounter.  No follow-ups on file.  Donald Laymon G. [place], MD  [facility]. [place] office.  Discharge Instructions   None

## 2023-08-11 ENCOUNTER — Telehealth: Payer: Self-pay | Admitting: Family Medicine

## 2023-08-11 ENCOUNTER — Ambulatory Visit (INDEPENDENT_AMBULATORY_CARE_PROVIDER_SITE_OTHER): Payer: Medicare HMO | Admitting: Family Medicine

## 2023-08-11 ENCOUNTER — Telehealth: Payer: Self-pay

## 2023-08-11 ENCOUNTER — Encounter: Payer: Self-pay | Admitting: Family Medicine

## 2023-08-11 VITALS — BP 130/80 | HR 76 | Temp 97.9°F | Resp 16 | Ht 70.0 in | Wt 161.2 lb

## 2023-08-11 DIAGNOSIS — Z122 Encounter for screening for malignant neoplasm of respiratory organs: Secondary | ICD-10-CM

## 2023-08-11 DIAGNOSIS — R5383 Other fatigue: Secondary | ICD-10-CM

## 2023-08-11 DIAGNOSIS — R0989 Other specified symptoms and signs involving the circulatory and respiratory systems: Secondary | ICD-10-CM

## 2023-08-11 DIAGNOSIS — R42 Dizziness and giddiness: Secondary | ICD-10-CM

## 2023-08-11 DIAGNOSIS — R351 Nocturia: Secondary | ICD-10-CM | POA: Diagnosis not present

## 2023-08-11 DIAGNOSIS — G4710 Hypersomnia, unspecified: Secondary | ICD-10-CM

## 2023-08-11 DIAGNOSIS — F5104 Psychophysiologic insomnia: Secondary | ICD-10-CM | POA: Diagnosis not present

## 2023-08-11 MED ORDER — MECLIZINE HCL 25 MG PO TABS: 25.0000 mg | ORAL_TABLET | Freq: Three times a day (TID) | ORAL | 1 refills | Status: DC | PRN

## 2023-08-11 NOTE — Telephone Encounter (Signed)
I spoke with patient's wife, she is aware of message below.

## 2023-08-11 NOTE — Patient Instructions (Addendum)
A few things to remember from today's visit:  Dizziness - Plan: MR Brain Wo Contrast  Insomnia, unspecified type  Nocturia - Plan: Ambulatory referral to Sleep Studies  Daytime hypersomnia - Plan: Ambulatory referral to Sleep Studies   Dizziness is a perception of movement, it is sometimes difficult to describe and can be  caused by different problems, most benign but others can be life threaten.  Vertigo is the most common cause of dizziness, usually related with inner ear and can be associated with nausea, vomiting, and unbalance sensation. It can be complicated by falls due to lose of balance; so fall precautions are very important.  Most of the time dizziness is benign, usually intermittent, last a few seconds at the time and aggravated by certain positions. It usually resolves in a few weeks without residual effect but it could be recurrent.  Sometimes blood work is ordered to evaluate for other possible causes.  Dizziness can also be caused by certain medications, dehydration, migraines, and strokes.  Medication prescribed for vertigo, Meclizine, causes drowsiness/sleepiness, so frequently I recommended taking it at bedtime. I also recommend what we called vestibular exercise, sometimes can be done at home (Modified Semont maneuvers) other times I refer patients to vestibular rehabilitation.   Seek immediate medical attention if: New severe headache, dobble vision, fever (100 F or more), associated numbness/tingling, focal weakness, persistent vomiting, not able to walk, or sudden worsening symptoms.  If you need refills for medications you take chronically, please call your pharmacy. Do not use My Chart to request refills or for acute issues that need immediate attention. If you send a my chart message, it may take a few days to be addressed, specially if I am not in the office.  Please be sure medication list is accurate. If a new problem present, please set up appointment  sooner than planned today.

## 2023-08-11 NOTE — Telephone Encounter (Signed)
Patient's spouse had a question about the carotid bruit you may have heard during your exam?

## 2023-08-11 NOTE — Telephone Encounter (Signed)
Has appt this morning.

## 2023-08-11 NOTE — Telephone Encounter (Signed)
Was called to schedule a "carotid test", requesting a call, wants to know why

## 2023-08-11 NOTE — Telephone Encounter (Signed)
See other phone note

## 2023-08-11 NOTE — Telephone Encounter (Signed)
I mentioned that I thought I heard a right-sided carotid bruit. Because he goes to cardiologist, I was going to see if he has had a carotid US, did not see one. I am not certain this could be causing his dizziness but I placed an order for carotid US. Thanks, BJ

## 2023-08-11 NOTE — Assessment & Plan Note (Signed)
He reports that he tried trazodone in the past and it did not help. We discussed some side effects of Ambien, it seems to help, so continue 5 to 10 mg daily at bedtime. Continue good sleep hygiene.

## 2023-08-11 NOTE — Assessment & Plan Note (Signed)
We again reviewed possible etiologies. Some of his chronic comorbidities as well as medications can be contributing factors. Improving his sleep consistently may help. Sleep study will be arranged.

## 2023-08-15 ENCOUNTER — Ambulatory Visit (HOSPITAL_BASED_OUTPATIENT_CLINIC_OR_DEPARTMENT_OTHER)
Admission: RE | Admit: 2023-08-15 | Discharge: 2023-08-15 | Disposition: A | Discharge status: Home / Self Care | Payer: Medicare HMO | Source: Ambulatory Visit | Attending: Family Medicine | Admitting: Family Medicine

## 2023-08-15 DIAGNOSIS — J323 Chronic sphenoidal sinusitis: Secondary | ICD-10-CM | POA: Insufficient documentation

## 2023-08-15 DIAGNOSIS — R42 Dizziness and giddiness: Secondary | ICD-10-CM | POA: Insufficient documentation

## 2023-09-01 ENCOUNTER — Encounter (HOSPITAL_BASED_OUTPATIENT_CLINIC_OR_DEPARTMENT_OTHER): Payer: Medicare HMO

## 2023-09-09 ENCOUNTER — Institutional Professional Consult (permissible substitution): Payer: Medicare HMO | Admitting: Neurology

## 2023-09-16 ENCOUNTER — Ambulatory Visit (HOSPITAL_BASED_OUTPATIENT_CLINIC_OR_DEPARTMENT_OTHER): Payer: Medicare HMO

## 2023-09-16 DIAGNOSIS — R0989 Other specified symptoms and signs involving the circulatory and respiratory systems: Secondary | ICD-10-CM

## 2023-09-26 ENCOUNTER — Telehealth: Payer: Self-pay | Admitting: Cardiology

## 2023-09-26 NOTE — Telephone Encounter (Signed)
See comments from Betty [place], MD on vas duplex from 10/15.   Next appt with Dr. Jens Som is not until 2025. Pt reports dizziness is about the same (intermittent) and meclizine prescribe by PCP helps. No other symptoms reported at this time.

## 2023-09-26 NOTE — Telephone Encounter (Signed)
Patient called stating his PCP sent him for Carotid U/S he would like for Dr. Jens Som to review those results.

## 2023-09-29 NOTE — Telephone Encounter (Signed)
Spoke with pt wife Aware of dr Ludwig Clarks recommendations. The patient was given meclizine but it is causing him to have bad dreams. They are going to try OTC zyrtec ot claritin to see if that helps.

## 2023-10-02 ENCOUNTER — Other Ambulatory Visit: Payer: Self-pay

## 2023-10-02 DIAGNOSIS — Z87891 Personal history of nicotine dependence: Secondary | ICD-10-CM

## 2023-10-02 DIAGNOSIS — Z122 Encounter for screening for malignant neoplasm of respiratory organs: Secondary | ICD-10-CM

## 2023-10-07 ENCOUNTER — Encounter: Payer: Self-pay | Admitting: *Deleted

## 2023-10-10 ENCOUNTER — Telehealth: Payer: Self-pay | Admitting: Family Medicine

## 2023-10-10 NOTE — Telephone Encounter (Signed)
Patient has appointment 11/13, will have ready for him to take with him that day.

## 2023-10-10 NOTE — Telephone Encounter (Signed)
Requests handicapped placard

## 2023-10-10 NOTE — Telephone Encounter (Signed)
Noted. Will put in PCP's bin to be signed. She will return Monday.

## 2023-10-15 ENCOUNTER — Ambulatory Visit (INDEPENDENT_AMBULATORY_CARE_PROVIDER_SITE_OTHER): Payer: Medicare HMO | Admitting: Family Medicine

## 2023-10-15 ENCOUNTER — Encounter: Payer: Self-pay | Admitting: Family Medicine

## 2023-10-15 VITALS — BP 100/60 | HR 65 | Resp 16 | Ht 70.0 in | Wt 171.0 lb

## 2023-10-15 DIAGNOSIS — R350 Frequency of micturition: Secondary | ICD-10-CM

## 2023-10-15 DIAGNOSIS — R5383 Other fatigue: Secondary | ICD-10-CM | POA: Diagnosis not present

## 2023-10-15 DIAGNOSIS — F5104 Psychophysiologic insomnia: Secondary | ICD-10-CM

## 2023-10-15 DIAGNOSIS — B372 Candidiasis of skin and nail: Secondary | ICD-10-CM | POA: Diagnosis not present

## 2023-10-15 DIAGNOSIS — I739 Peripheral vascular disease, unspecified: Secondary | ICD-10-CM

## 2023-10-15 MED ORDER — FLUCONAZOLE 150 MG PO TABS: 150.0000 mg | ORAL_TABLET | ORAL | 0 refills | Status: AC

## 2023-10-15 MED ORDER — CLOTRIMAZOLE-BETAMETHASONE 1-0.05 % EX CREA: 1.0000 | TOPICAL_CREAM | Freq: Every day | CUTANEOUS | 0 refills | Status: AC

## 2023-10-15 NOTE — Patient Instructions (Addendum)
A few things to remember from today's visit:  Intertriginous candidiasis - Plan: fluconazole (DIFLUCAN) 150 MG tablet, clotrimazole-betamethasone (LOTRISONE) cream  Chronic insomnia  Fatigue, unspecified type  Urinary frequency  PAD (peripheral artery disease) (HCC) Continue Ambien 10 mg at bedtime and wear urine cath at night. Keep appt with urologist. Diflucan 150 mg weekly 2 weeks. Small amount of Lotrisone on affected area. Monitor blood pressure and let me know about readings in 2 weeks.  No changes today.  If you need refills for medications you take chronically, please call your pharmacy. Do not use My Chart to request refills or for acute issues that need immediate attention. If you send a my chart message, it may take a few days to be addressed, specially if I am not in the office.  Please be sure medication list is accurate. If a new problem present, please set up appointment sooner than planned today.

## 2023-10-15 NOTE — Progress Notes (Signed)
HPI: [name] is a 72 y.o. male with a PMHx significant for CAD, aortic atherosclerosis, HLD, HTN, and chronic pain here today with his wife for chronic disease management.  Last seen on 08/11/2023  Dizziness/vertigo:  Patient was taking meclizine 25 mg, but stopped because he was causing nightmares and vivid dreams, resolved after he discontinuation. He says he is not having episodes except when he does his vestibular exercises at home, which seems to help.  No new associated symptoms. Reports that problem has greatly improved.  Fatigue/insomnia He says he is still having significant fatigue.  Fatigue has been going on for years.  He says he still cannot sleep because he wakes up to urinate every 45 minutes. He tried adult diapers, but had a  very pruritic rash which also keeps him awake. He still has the rash, it has improved after stopping diaper use.    He also tried a urine catheter, but said he would still wake up when he had the urge to urinate.  When he takes Ambien 5 mg 2 tabs at night, he sleeps well, but wets the bed. So he doe snot take medication frequently. States that when he sleeps better, taking Ambien, he notices he feels rested.  He has seen 3 urologists, and has an appointment scheduled for a fourth.  He has not had a sleep study done to evaluate for sleep apnea.  Since his last visit he has seen his cardiologist and according to pt, carotid US results were discussed.  Carotid duplex on 09/16/23 showed right carotid artery with "some narrowing." Currently on rosuvastatin 40 mg daily and zetia 10 mg daily.  He sees his cardiologist once per year.   Lab Results  Component Value Date   CHOL 202 (H) 06/18/2023   HDL 58 06/18/2023   LDLCALC 123 (H) 06/18/2023   TRIG 104 06/18/2023   CHOLHDL 3.5 06/18/2023   His wife is concerned about BP reading today, he is not longer on Ramipril 5 mg daily.  Review of Systems  Constitutional:  Negative for  activity change, appetite change, chills and fever.  HENT:  Negative for mouth sores and sore throat.   Respiratory:  Negative for cough and wheezing.   Cardiovascular:  Negative for chest pain, palpitations and leg swelling.  Gastrointestinal:  Negative for abdominal pain, nausea and vomiting.  Genitourinary:  Negative for dysuria and hematuria.  Musculoskeletal:  Positive for arthralgias, back pain and gait problem.  Psychiatric/Behavioral:  Negative for confusion and hallucinations.   See other pertinent positives and negatives in HPI.  Current Outpatient Medications on File Prior to Visit  Medication Sig Dispense Refill   ezetimibe (ZETIA) 10 MG tablet Take 1 tablet (10 mg total) by mouth daily. 90 tablet 3   HYDROcodone-acetaminophen (NORCO) 10-325 MG tablet Take 1 tablet by mouth every 6 (six) hours as needed for moderate pain. (Patient taking differently: Take 1 tablet by mouth every 6 (six) hours.) 40 tablet 0   levothyroxine (SYNTHROID) 125 MCG tablet Take 125 mcg by mouth daily.     nitroGLYCERIN (NITROSTAT) 0.4 MG SL tablet DISSOLVE 1 TABLET UNDER TONGUE AT ONSET OF CHEST PAIN. MAY REPEAT DOSE EVERY 5 MINUTES UP TO 3 DOSES AS NEEDED. IF PAIN NOT RELIEVED, CALL 911. Strength: 0.4 mg 25 tablet 3   pregabalin (LYRICA) 200 MG capsule Take 200 mg by mouth at bedtime.     Probiotic Product (PROBIOTIC DAILY PO) Take 1 tablet by mouth daily.  zolpidem (AMBIEN) 5 MG tablet Take 5-10 mg by mouth at bedtime as needed for sleep.     rosuvastatin (CRESTOR) 40 MG tablet Take 1 tablet (40 mg total) by mouth daily. 90 tablet 3   No current facility-administered medications on file prior to visit.    Past Medical History:  Diagnosis Date   Abnormal gait    Allergic rhinitis, cause unspecified 07/18/2008   Back pain, chronic 10/01/2011   CAD (coronary artery disease)    Carpal tunnel syndrome on left    Chronic low back pain 12/25/2012   Degenerative arthritis of right knee 10/01/2011    Diverticulosis of colon (without mention of hemorrhage) 07/18/2008   Gross hematuria 07/18/2008   Headache(784.0)    Hx: of Migraines as a child   Hyperlipidemia 07/18/2008   Hypertension    Idiopathic progressive neuropathy    Personal history of colonic polyps 07/18/2008   PVD (peripheral vascular disease) (HCC)    Tobacco use disorder 04/06/2009   No Known Allergies  Social History   Socioeconomic History   Marital status: Married    Spouse name: Donald Roberts   Number of children: 1   Years of education: Not on file   Highest education level: High school graduate  Occupational History   Occupation: Surveyor, minerals and Midwife    Comment: semi retired  Tobacco Use   Smoking status: Former    Current packs/day: 0.50    Average packs/day: 0.5 packs/day for 40.0 years (20.0 ttl pk-yrs)    Types: Cigarettes   Smokeless tobacco: Never  Vaping Use   Vaping status: Never Used  Substance and Sexual Activity   Alcohol use: Yes    Alcohol/week: 2.0 standard drinks of alcohol    Types: 2 Cans of beer per week    Comment: occasional   Drug use: No   Sexual activity: Not Currently  Other Topics Concern   Not on file  Social History Narrative   Married, 1 daughter lives close by.   Spends time at [employer] in [place].   Social Determinants of Health   Financial Resource Strain: Not on file  Food Insecurity: Not on file  Transportation Needs: Not on file  Physical Activity: Not on file  Stress: Not on file  Social Connections: Not on file    Vitals:   10/15/23 1103  BP: 100/60  Pulse: 65  Resp: 16  SpO2: 96%   Body mass index is 24.54 kg/m.  Physical Exam Vitals and nursing note reviewed.  Constitutional:      General: He is not in acute distress.    Appearance: He is well-developed.  HENT:     Head: Normocephalic and atraumatic.     Mouth/Throat:     Mouth: Mucous membranes are moist.  Eyes:     Conjunctiva/sclera: Conjunctivae normal.  Cardiovascular:      Rate and Rhythm: Normal rate and regular rhythm.     Pulses:          Dorsalis pedis pulses are 2+ on the right side and 2+ on the left side.     Heart sounds: No murmur heard. Pulmonary:     Effort: Pulmonary effort is normal. No respiratory distress.     Breath sounds: Normal breath sounds.  Abdominal:     Palpations: Abdomen is soft. There is no mass.     Tenderness: There is no abdominal tenderness.  Genitourinary:    Comments: Declined chaperone. Erythema affecting groin areas and scrotum, not in creases.  On right scrotum a very superficial excoriation. No tenderness or induration. Musculoskeletal:     Right lower leg: No edema.     Left lower leg: No edema.  Skin:    General: Skin is warm.     Findings: No erythema or rash.  Neurological:     Mental Status: He is alert and oriented to person, place, and time.     Comments: Unstable gait, not assisted.  Psychiatric:        Mood and Affect: Mood and affect normal.   ASSESSMENT AND PLAN:  Mr. Quiros was seen today for chronic follow up.   Fatigue, unspecified type Assessment & Plan: Possible etiologies discussed. He does not think he needs the sleep study. Sleep disruption due to urinary frequency seems to be the major problem but some of his other chronic comorbidities and medications can also be contributing factors.. When he takes Ambien, sleeps through the nigh and feels rested the next day, so recommend continuing it.  Chronic insomnia Assessment & Plan: Ambien 5 mg 2 tabs at bedtime helps him sleep through the night. Other medications like Trazodone did not help. Continue taking medication at bedtime as needed, some side effects discussed. Medication is prescribed by his pain management provider. Good sleep hygiene recommended, urinary frequency is a major continuing factor.  Intertriginous candidiasis Caused by wearing diapers at night, it has improved. Diflucan 150 mg weekly x 2 and topical treatment  recommended. Keeps area clean with soap and water as well as dry. F/U as needed.  -     Fluconazole; Take 1 tablet (150 mg total) by mouth once a week for 2 doses.  Dispense: 2 tablet; Refill: 0 -     Clotrimazole-Betamethasone; Apply 1 Application topically daily for 14 days.  Dispense: 14 g; Refill: 0  Urinary frequency Assessment & Plan: Chronic, ? BPH He has tried several pharmacologic treatments but they were not effective. He is planning on seeing a 4th urologist in 01/2024. He can try urine cath again at night, diaper caused severe rash.  PAD (peripheral artery disease) (HCC) Right carotid stenosis. Continue Rosuvastatin and Zetia. Last LDL 123 in 06/2023. Following with cardiologist.  Return in about 1 year (around 10/14/2024) for CPE.  I, Suanne Marker, acting as a scribe for Osie Merkin [place], MD., have documented all relevant documentation on the behalf of Rainen Vanrossum [place], MD, as directed by  Rolla Kedzierski [place], MD while in the presence of Reighan Hipolito [place], MD.   I, Eoin Willden [place], MD, have reviewed all documentation for this visit. The documentation on 10/16/23 for the exam, diagnosis, procedures, and orders are all accurate and complete.  Alajah Witman G. [place], MD  [facility]. [place] office.

## 2023-10-16 NOTE — Assessment & Plan Note (Signed)
Chronic, ? BPH He has tried several pharmacologic treatments but they were not effective. He is planning on seeing a 4th urologist in 01/2024. He can try urine cath again at night, diaper caused severe rash.

## 2023-10-16 NOTE — Assessment & Plan Note (Signed)
Possible etiologies discussed. He does not think he needs the sleep study. Sleep disruption due to urinary frequency seems to be the major problem but some of his other chronic comorbidities and medications can also be contributing factors.. When he takes Ambien, sleeps through the nigh and feels rested the next day, so recommend continuing it.

## 2023-10-16 NOTE — Assessment & Plan Note (Signed)
Ambien 5 mg 2 tabs at bedtime helps him sleep through the night. Other medications like Trazodone did not help. Continue taking medication at bedtime as needed, some side effects discussed. Medication is prescribed by his pain management provider. Good sleep hygiene recommended, urinary frequency is a major continuing factor.

## 2023-10-20 ENCOUNTER — Telehealth: Payer: Self-pay

## 2023-10-20 NOTE — Telephone Encounter (Signed)
I left pt a voicemail letting him know that his handicap placard is ready for pick up.

## 2023-10-22 ENCOUNTER — Ambulatory Visit
Admission: RE | Admit: 2023-10-22 | Discharge: 2023-10-22 | Disposition: A | Discharge status: Home / Self Care | Payer: Medicare HMO | Source: Ambulatory Visit | Attending: Family Medicine | Admitting: Family Medicine

## 2023-10-22 DIAGNOSIS — Z122 Encounter for screening for malignant neoplasm of respiratory organs: Secondary | ICD-10-CM | POA: Insufficient documentation

## 2023-10-22 DIAGNOSIS — Z87891 Personal history of nicotine dependence: Secondary | ICD-10-CM | POA: Diagnosis present

## 2023-10-27 ENCOUNTER — Telehealth: Payer: Self-pay | Admitting: Acute Care

## 2023-10-27 NOTE — Telephone Encounter (Signed)
Calling for her husband LCS results

## 2023-10-27 NOTE — Telephone Encounter (Signed)
Spoke with patient and explained results are not yet in and we have been experiencing delays in getting results due to a [place] shortage of radiologists who read these images.  Expect to receive results over the next few weeks and will notify patient by letter or phone call.  Patient acknowledged understanding.

## 2023-11-03 ENCOUNTER — Telehealth: Payer: Self-pay | Admitting: Family Medicine

## 2023-11-03 NOTE — Telephone Encounter (Signed)
Checking on results from lung scan

## 2023-11-03 NOTE — Telephone Encounter (Signed)
Returned call and spoke with wife. Advised results are taking 2-3 weeks to be final. Advised staff will call when results are received. Wife verbalized understanding.

## 2023-11-06 ENCOUNTER — Telehealth: Payer: Self-pay | Admitting: Family Medicine

## 2023-11-06 NOTE — Telephone Encounter (Signed)
Requesting refill of clotrimazole-betamethasone (LOTRISONE) cream some of the symptoms still lingering from last appointment

## 2023-11-07 MED ORDER — CLOTRIMAZOLE-BETAMETHASONE 1-0.05 % EX CREA: 1.0000 | TOPICAL_CREAM | Freq: Every day | CUTANEOUS | 0 refills | Status: DC

## 2023-11-07 NOTE — Addendum Note (Signed)
Addended by: Kathreen Devoid on: 11/07/2023 07:15 AM   Modules accepted: Orders

## 2023-11-14 ENCOUNTER — Other Ambulatory Visit: Payer: Self-pay | Admitting: Acute Care

## 2023-11-14 DIAGNOSIS — Z87891 Personal history of nicotine dependence: Secondary | ICD-10-CM

## 2023-11-14 DIAGNOSIS — Z122 Encounter for screening for malignant neoplasm of respiratory organs: Secondary | ICD-10-CM

## 2023-11-19 NOTE — Telephone Encounter (Signed)
Spoke with wife this morning and reviewed results.

## 2023-11-19 NOTE — Telephone Encounter (Signed)
Copied from CRM [id number]. Topic: Clinical - Lab/Test Results >> Nov 19, 2023  9:21 AM Orinda Kenner C wrote: Reason for CRM: Pt's wife [name] [phone] Lung scan in 10/2023, still waiting for results, pls c/b.

## 2023-12-05 ENCOUNTER — Ambulatory Visit (INDEPENDENT_AMBULATORY_CARE_PROVIDER_SITE_OTHER): Payer: Medicare HMO

## 2023-12-05 VITALS — BP 120/60 | HR 68 | Temp 97.5°F | Ht 70.0 in | Wt 170.3 lb

## 2023-12-05 DIAGNOSIS — Z0000 Encounter for general adult medical examination without abnormal findings: Secondary | ICD-10-CM

## 2023-12-05 NOTE — Patient Instructions (Addendum)
 [name] , Thank you for taking time to come for your Medicare Wellness Visit. I appreciate your ongoing commitment to your health goals. Please review the following plan we discussed and let me know if I can assist you in the future.   Referrals/Orders/Follow-Ups/Clinician Recommendations:   This is a list of the screening recommended for you and due dates:  Health Maintenance  Topic Date Due   Zoster (Shingles) Vaccine (1 of 2) Never done   COVID-19 Vaccine (3 - Pfizer risk series) 10/04/2020   Pneumonia Vaccine (1 of 2 - PCV) 06/17/2024*   Flu Shot  12/30/2025*   Screening for Lung Cancer  10/21/2024   Medicare Annual Wellness Visit  12/04/2024   DTaP/Tdap/Td vaccine (2 - Tdap) 02/11/2028   Colon Cancer Screening  10/02/2028   Hepatitis C Screening  Completed   HPV Vaccine  Aged Out  *Topic was postponed. The date shown is not the original due date.  Opioid Pain Medicine Management Opioids are powerful medicines that are used to treat moderate to severe pain. When used for short periods of time, they can help you to: Sleep better. Do better in physical or occupational therapy. Feel better in the first few days after an injury. Recover from surgery. Opioids should be taken with the supervision of a trained health care provider. They should be taken for the shortest period of time possible. This is because opioids can be addictive, and the longer you take opioids, the greater your risk of addiction. This addiction can also be called opioid use disorder. What are the risks? Using opioid pain medicines for longer than 3 days increases your risk of side effects. Side effects include: Constipation. Nausea and vomiting. Breathing difficulties (respiratory depression). Drowsiness. Confusion. Opioid use disorder. Itching. Taking opioid pain medicine for a long period of time can affect your ability to do daily tasks. It also puts you at risk for: Motor vehicle  crashes. Depression. Suicide. Heart attack. Overdose, which can be life-threatening. What is a pain treatment plan? A pain treatment plan is an agreement between you and your health care provider. Pain is unique to each person, and treatments vary depending on your condition. To manage your pain, you and your health care provider need to work together. To help you do this: Discuss the goals of your treatment, including how much pain you might expect to have and how you will manage the pain. Review the risks and benefits of taking opioid medicines. Remember that a good treatment plan uses more than one approach and minimizes the chance of side effects. Be honest about the amount of medicines you take and about any drug or alcohol use. Get pain medicine prescriptions from only one health care provider. Pain can be managed with many types of alternative treatments. Ask your health care provider to refer you to one or more specialists who can help you manage pain through: Physical or occupational therapy. Counseling (cognitive behavioral therapy). Good nutrition. Biofeedback. Massage. Meditation. Non-opioid medicine. Following a gentle exercise program. How to use opioid pain medicine Taking medicine Take your pain medicine exactly as told by your health care provider. Take it only when you need it. If your pain gets less severe, you may take less than your prescribed dose if your health care provider approves. If you are not having pain, do nottake pain medicine unless your health care provider tells you to take it. If your pain is severe, do nottry to treat it yourself by taking more pills  than instructed on your prescription. Contact your health care provider for help. Write down the times when you take your pain medicine. It is easy to become confused while on pain medicine. Writing the time can help you avoid overdose. Take other over-the-counter or prescription medicines only as told by  your health care provider. Keeping yourself and others safe  While you are taking opioid pain medicine: Do not drive, use machinery, or power tools. Do not sign legal documents. Do not drink alcohol. Do not take sleeping pills. Do not supervise children by yourself. Do not do activities that require climbing or being in high places. Do not go to a lake, river, ocean, spa, or swimming pool. Do not share your pain medicine with anyone. Keep pain medicine in a locked cabinet or in a secure area where pets and children cannot reach it. Stopping your use of opioids If you have been taking opioid medicine for more than a few weeks, you may need to slowly decrease (taper) how much you take until you stop completely. Tapering your use of opioids can decrease your risk of symptoms of withdrawal, such as: Pain and cramping in the abdomen. Nausea. Sweating. Sleepiness. Restlessness. Uncontrollable shaking (tremors). Cravings for the medicine. Do not attempt to taper your use of opioids on your own. Talk with your health care provider about how to do this. Your health care provider may prescribe a step-down schedule based on how much medicine you are taking and how long you have been taking it. Getting rid of leftover pills Do not save any leftover pills. Get rid of leftover pills safely by: Taking the medicine to a prescription take-back program. This is usually offered by the county or law enforcement. Bringing them to a pharmacy that has a drug disposal container. Flushing them down the toilet. Check the label or package insert of your medicine to see whether this is safe to do. Throwing them out in the trash. Check the label or package insert of your medicine to see whether this is safe to do. If it is safe to throw it out, remove the medicine from the original container, put it into a sealable bag or container, and mix it with used coffee grounds, food scraps, dirt, or cat litter before putting  it in the trash. Follow these instructions at home: Activity Do exercises as told by your health care provider. Avoid activities that make your pain worse. Return to your normal activities as told by your health care provider. Ask your health care provider what activities are safe for you. General instructions You may need to take these actions to prevent or treat constipation: Drink enough fluid to keep your urine pale yellow. Take over-the-counter or prescription medicines. Eat foods that are high in fiber, such as beans, whole grains, and fresh fruits and vegetables. Limit foods that are high in fat and processed sugars, such as fried or sweet foods. Keep all follow-up visits. This is important. Where to find support If you have been taking opioids for a long time, you may benefit from receiving support for quitting from a local support group or counselor. Ask your health care provider for a referral to these resources in your area. Where to find more information Centers for Disease Control and Prevention (CDC): footballexhibition.com.br [place] Food and Drug Administration (FDA): pumpkinsearch.com.ee Get help right away if: You may have taken too much of an opioid (overdosed). Common symptoms of an overdose: Your breathing is slower or more shallow than normal.  You have a very slow heartbeat (pulse). You have slurred speech. You have nausea and vomiting. Your pupils become very small. You have other potential symptoms: You are very confused. You faint or feel like you will faint. You have cold, clammy skin. You have blue lips or fingernails. You have thoughts of harming yourself or harming others. These symptoms may represent a serious problem that is an emergency. Do not wait to see if the symptoms will go away. Get medical help right away. Call your local emergency services (911 in the [place] Do not drive yourself to the hospital.  If you ever feel like you may hurt yourself or others, or have thoughts  about taking your own life, get help right away. Go to your nearest emergency department or: Call your local emergency services (911 in the [place] Call the [facility] ((952)385-2070 in the [place] Call a suicide crisis helpline, such as the National Suicide Prevention Lifeline at 785-588-9661 or 988 in the [place] This is open 24 hours a day in the [place] Text the Crisis Text Line at 952-358-8564 (in the [place] Summary Opioid medicines can help you manage moderate to severe pain for a short period of time. A pain treatment plan is an agreement between you and your health care provider. Discuss the goals of your treatment, including how much pain you might expect to have and how you will manage the pain. If you think that you or someone else may have taken too much of an opioid, get medical help right away. This information is not intended to replace advice given to you by your health care provider. Make sure you discuss any questions you have with your health care provider. Document Revised: 06/13/2021 Document Reviewed: 02/28/2021 Elsevier Patient Education  2024 [employer]   Advanced directives: (Declined) Advance directive discussed with you today. Even though you declined this today, please call our office should you change your mind, and we can give you the proper paperwork for you to fill out.  Next Medicare Annual Wellness Visit scheduled for next year: Yes

## 2023-12-05 NOTE — Progress Notes (Signed)
 Subjective:   Ronit Marczak is a 73 y.o. male who presents for Medicare Annual/Subsequent preventive examination.  Visit Complete: In person      Objective:    Today's Vitals   12/05/23 1457  BP: 120/60  Pulse: 68  Temp: (!) 97.5 F (36.4 C)  TempSrc: Oral  SpO2: 94%  Weight: 170 lb 4.8 oz (77.2 kg)  Height: 5' 10 (1.778 m)   Body mass index is 24.44 kg/m.     12/05/2023    3:20 PM 03/18/2023   10:44 PM 01/15/2023    8:06 AM 10/03/2020    6:48 AM 11/04/2018    6:55 AM 10/28/2018    2:00 PM 05/12/2018    7:56 AM  Advanced Directives  Does Patient Have a Medical Advance Directive? No No No No Yes Yes No  Type of Advance Directive     Living will Living will   Does patient want to make changes to medical advance directive?     No - Patient declined No - Patient declined   Would patient like information on creating a medical advance directive? No - Patient declined  No - Patient declined No - Patient declined   No - Patient declined    Current Medications (verified) Outpatient Encounter Medications as of 12/05/2023  Medication Sig   clotrimazole -betamethasone  (LOTRISONE ) cream Apply 1 Application topically daily.   ezetimibe  (ZETIA ) 10 MG tablet Take 1 tablet (10 mg total) by mouth daily.   HYDROcodone -acetaminophen  (NORCO) 10-325 MG tablet Take 1 tablet by mouth every 6 (six) hours as needed for moderate pain. (Patient taking differently: Take 1 tablet by mouth every 6 (six) hours.)   levothyroxine (SYNTHROID) 125 MCG tablet Take 125 mcg by mouth daily.   nitroGLYCERIN  (NITROSTAT ) 0.4 MG SL tablet DISSOLVE 1 TABLET UNDER TONGUE AT ONSET OF CHEST PAIN. MAY REPEAT DOSE EVERY 5 MINUTES UP TO 3 DOSES AS NEEDED. IF PAIN NOT RELIEVED, CALL 911. Strength: 0.4 mg   pregabalin  (LYRICA ) 200 MG capsule Take 200 mg by mouth at bedtime.   Probiotic Product (PROBIOTIC DAILY PO) Take 1 tablet by mouth daily.   rosuvastatin  (CRESTOR ) 40 MG tablet Take 1 tablet (40 mg total) by mouth  daily.   zolpidem  (AMBIEN ) 5 MG tablet Take 5-10 mg by mouth at bedtime as needed for sleep.   No facility-administered encounter medications on file as of 12/05/2023.    Allergies (verified) Patient has no known allergies.   History: Past Medical History:  Diagnosis Date   Abnormal gait    Allergic rhinitis, cause unspecified 07/18/2008   Back pain, chronic 10/01/2011   CAD (coronary artery disease)    Carpal tunnel syndrome on left    Chronic low back pain 12/25/2012   Degenerative arthritis of right knee 10/01/2011   Diverticulosis of colon (without mention of hemorrhage) 07/18/2008   Gross hematuria 07/18/2008   Headache(784.0)    Hx: of Migraines as a child   Hyperlipidemia 07/18/2008   Hypertension    Idiopathic progressive neuropathy    Personal history of colonic polyps 07/18/2008   PVD (peripheral vascular disease) (HCC)    Tobacco use disorder 04/06/2009   Past Surgical History:  Procedure Laterality Date   ANTERIOR CERVICAL DECOMP/DISCECTOMY FUSION N/A 09/10/2013   Procedure: ANTERIOR CERVICAL DECOMPRESSION/DISCECTOMY FUSION CERVICAL FOUR-FIVE,CERVICAL FIVE-SIX WITH PLATING AND BONE GRAFT;  Surgeon: Catalina CHRISTELLA Stains, MD;  Location: [facility];  Service: Neurosurgery;  Laterality: N/A;   ANTERIOR CERVICAL DECOMP/DISCECTOMY FUSION N/A 03/14/2015   Procedure:  ANTERIOR CERVICAL DECOMPRESSION/DISCECTOMY FUSION  CERVICAL TWO-THREE;  Surgeon: Catalina Stains, MD;  Location: [facility];  Service: Neurosurgery;  Laterality: N/A;   ANTERIOR CERVICAL DECOMP/DISCECTOMY FUSION N/A 10/03/2020   Procedure: Anterior Cervial Discectomy and Fusion Cervical three and cervical four;  Surgeon: Louis Shove, MD;  Location: [facility] OR;  Service: Neurosurgery;  Laterality: N/A;  3C   CARDIAC CATHETERIZATION     COLONOSCOPY     COLONOSCOPY W/ BIOPSIES AND POLYPECTOMY     Hx: of   LEFT HEART CATH AND CORONARY ANGIOGRAPHY N/A 11/04/2018   Procedure: LEFT HEART CATH AND CORONARY ANGIOGRAPHY;   Surgeon: Claudene Pacific, MD;  Location: [facility];  Service: Cardiovascular;  Laterality: N/A;   LEFT HEART CATH AND CORONARY ANGIOGRAPHY N/A 01/15/2023   Procedure: LEFT HEART CATH AND CORONARY ANGIOGRAPHY;  Surgeon: Anner Alm ORN, MD;  Location: [facility] INVASIVE CV LAB;  Service: Cardiovascular;  Laterality: N/A;   lumbar surgury     dr botero/ns; 3 level fusion per pt   POLYPECTOMY     s/p cervical disease x 2     Family History  Problem Relation Age of Onset   Heart disease Mother    Heart attack Father    Heart disease Father    Cancer Sister    Diabetes Sister    Heart disease Sister    Heart disease Brother    Parkinsonism Paternal Uncle    Heart disease Other    Colon cancer Neg Hx    Colon polyps Neg Hx    Esophageal cancer Neg Hx    Rectal cancer Neg Hx    Stomach cancer Neg Hx    Social History   Socioeconomic History   Marital status: Married    Spouse name: Erminio   Number of children: 1   Years of education: Not on file   Highest education level: High school graduate  Occupational History   Occupation: surveyor, minerals and midwife    Comment: semi retired  Tobacco Use   Smoking status: Former    Current packs/day: 0.50    Average packs/day: 0.5 packs/day for 40.0 years (20.0 ttl pk-yrs)    Types: Cigarettes   Smokeless tobacco: Never  Vaping Use   Vaping status: Never Used  Substance and Sexual Activity   Alcohol use: Yes    Alcohol/week: 2.0 standard drinks of alcohol    Types: 2 Cans of beer per week    Comment: occasional   Drug use: No   Sexual activity: Not Currently  Other Topics Concern   Not on file  Social History Narrative   Married, 1 daughter lives close by.   Spends time at [employer] in [place].   Social Drivers of Corporate Investment Banker Strain: Low Risk  (12/05/2023)   Overall Financial Resource Strain (CARDIA)    Difficulty of Paying Living Expenses: Not hard at all  Food Insecurity: No Food Insecurity (12/05/2023)    Hunger Vital Sign    Worried About Running Out of Food in the Last Year: Never true    Ran Out of Food in the Last Year: Never true  Transportation Needs: No Transportation Needs (12/05/2023)   PRAPARE - Administrator, Civil Service (Medical): No    Lack of Transportation (Non-Medical): No  Physical Activity: Inactive (12/05/2023)   Exercise Vital Sign    Days of Exercise per Week: 0 days    Minutes of Exercise per Session: 0 min  Stress: No Stress  Concern Present (12/05/2023)   [employer] of Occupational Health - Occupational Stress Questionnaire    Feeling of Stress : Not at all  Social Connections: Socially Integrated (12/05/2023)   Social Connection and Isolation Panel [NHANES]    Frequency of Communication with Friends and Family: More than three times a week    Frequency of Social Gatherings with Friends and Family: More than three times a week    Attends Religious Services: More than 4 times per year    Active Member of [employer] or Organizations: Yes    Attends Engineer, Structural: More than 4 times per year    Marital Status: Married    Tobacco Counseling Counseling given: Not Answered   Clinical Intake:  Pre-visit preparation completed: Yes  Pain : No/denies pain     BMI - recorded: 24.44 Nutritional Status: BMI of 19-24  Normal Nutritional Risks: None Diabetes: No  How often do you need to have someone help you when you read instructions, pamphlets, or other written materials from your doctor or pharmacy?: 1 - Never  Interpreter Needed?: No  Information entered by :: Rojelio Blush LPN   Activities of Daily Living    12/05/2023    3:17 PM 06/18/2023   10:03 AM  In your present state of health, do you have any difficulty performing the following activities:  Hearing? 0 0  Vision? 0 0  Difficulty concentrating or making decisions? 0 0  Walking or climbing stairs? 0 0  Dressing or bathing? 0 0  Doing errands, shopping? 0 0  Preparing  Food and eating ? N   Using the Toilet? N   In the past six months, have you accidently leaked urine? Y   Comment Followed by Urologist   Do you have problems with loss of bowel control? N   Managing your Medications? N   Managing your Finances? N   Housekeeping or managing your Housekeeping? N     Patient Care Team: Jordan, Betty G, MD as PCP - General (Family Medicine) Pietro Redell RAMAN, MD as PCP - Cardiology (Cardiology) Mindi Mt, MD (Inactive) as Consulting Physician (Anesthesiology) Leeann Hover, MD as Consulting Physician (Neurosurgery) Jerrye Lamar CHRISTELLA Mickey., MD as Referring Physician (Pain Medicine)  Indicate any recent Medical Services you may have received from other than [facility] providers in the past year (date may be approximate).     Assessment:   This is a routine wellness examination for Delson.  Hearing/Vision screen Hearing Screening - Comments:: Denies hearing difficulties   Vision Screening - Comments:: Wears reading  glasses - Not up to date with routine eye exams. Declined    Goals Addressed               This Visit's Progress     Increase physical activity (pt-stated)        Stay Active!       Depression Screen    12/05/2023    2:57 PM 06/18/2023   10:03 AM 04/16/2022    8:59 AM 05/12/2018    7:56 AM 04/22/2018   11:17 AM 04/10/2018    8:56 AM 12/30/2016    9:51 AM  PHQ 2/9 Scores  PHQ - 2 Score 0 0 0 0 0 0 0  PHQ- 9 Score     0 0     Fall Risk    12/05/2023    3:18 PM 06/18/2023   10:03 AM 04/16/2022    8:59 AM 05/12/2018    7:56 AM 04/22/2018  11:17 AM  Fall Risk   Falls in the past year? 0 0 0 No No  Number falls in past yr: 0 0     Injury with Fall? 0 0   No  Risk for fall due to : No Fall Risks No Fall Risks     Follow up Falls prevention discussed Falls evaluation completed       MEDICARE RISK AT HOME: Medicare Risk at Home Any stairs in or around the home?: Yes If so, are there any without handrails?: No Home free of loose  throw rugs in walkways, pet beds, electrical cords, etc?: Yes Adequate lighting in your home to reduce risk of falls?: Yes Life alert?: No Use of a cane, walker or w/c?: No Grab bars in the bathroom?: Yes Shower chair or bench in shower?: No Elevated toilet seat or a handicapped toilet?: Yes  TIMED UP AND GO:  Was the test performed?  Yes  Length of time to ambulate 10 feet: 10 sec Gait steady and fast without use of assistive device    Cognitive Function:    04/10/2018    9:06 AM 12/30/2016    9:51 AM  MMSE - Mini Mental State Exam  Orientation to time 5 5  Orientation to Place 5 5  Registration 3 3  Attention/ Calculation 0 0  Recall 3 3  Language- name 2 objects 0 0  Language- repeat 1 1  Language- follow 3 step command 3 3  Language- read & follow direction 0 0  Write a sentence 0 0  Copy design 0 0  Total score 20 20        12/05/2023    3:20 PM  6CIT Screen  What Year? 0 points  What month? 0 points  What time? 0 points  Count back from 20 0 points  Months in reverse 0 points  Repeat phrase 0 points  Total Score 0 points    Immunizations Immunization History  Administered Date(s) Administered   PFIZER(Purple Top)SARS-COV-2 Vaccination 08/16/2020, 09/06/2020   Td 02/10/2018    TDAP status: Up to date  Flu Vaccine status: Declined, Education has been provided regarding the importance of this vaccine but patient still declined. Advised may receive this vaccine at local pharmacy or Health Dept. Aware to provide a copy of the vaccination record if obtained from local pharmacy or Health Dept. Verbalized acceptance and understanding.  Pneumococcal vaccine status: Declined,  Education has been provided regarding the importance of this vaccine but patient still declined. Advised may receive this vaccine at local pharmacy or Health Dept. Aware to provide a copy of the vaccination record if obtained from local pharmacy or Health Dept. Verbalized acceptance and  understanding.   Covid-19 vaccine status: Declined, Education has been provided regarding the importance of this vaccine but patient still declined. Advised may receive this vaccine at local pharmacy or Health Dept.or vaccine clinic. Aware to provide a copy of the vaccination record if obtained from local pharmacy or Health Dept. Verbalized acceptance and understanding.  Qualifies for Shingles Vaccine? Yes   Zostavax completed No   Shingrix Completed?: No.    Education has been provided regarding the importance of this vaccine. Patient has been advised to call insurance company to determine out of pocket expense if they have not yet received this vaccine. Advised may also receive vaccine at local pharmacy or Health Dept. Verbalized acceptance and understanding.  Screening Tests Health Maintenance  Topic Date Due   Zoster Vaccines- Shingrix (1 of  2) Never done   COVID-19 Vaccine (3 - Pfizer risk series) 10/04/2020   Pneumonia Vaccine 79+ Years old (1 of 2 - PCV) 06/17/2024 (Originally 07/10/1957)   INFLUENZA VACCINE  12/30/2025 (Originally 07/03/2023)   Lung Cancer Screening  10/21/2024   Medicare Annual Wellness (AWV)  12/04/2024   DTaP/Tdap/Td (2 - Tdap) 02/11/2028   Colonoscopy  10/02/2028   Hepatitis C Screening  Completed   HPV VACCINES  Aged Out    Health Maintenance  Health Maintenance Due  Topic Date Due   Zoster Vaccines- Shingrix (1 of 2) Never done   COVID-19 Vaccine (3 - Pfizer risk series) 10/04/2020    Colorectal cancer screening: Type of screening: Colonoscopy. Completed 10/03/23. Repeat every 3 years  Lung Cancer Screening: (Low Dose CT Chest recommended if Age 23-80 years, 20 pack-year currently smoking OR have quit w/in 15years.) does qualify.   Lung Cancer Screening 10/22/23 Completed  Additional Screening:  Hepatitis C Screening: does qualify; Completed 12/30/16  Vision Screening: Recommended annual ophthalmology exams for early detection of glaucoma and other  disorders of the eye. Is the patient up to date with their annual eye exam?  No Who is the provider or what is the name of the office in which the patient attends annual eye exams? No If pt is not established with a provider, would they like to be referred to a provider to establish care? No .   Dental Screening: Recommended annual dental exams for proper oral hygiene    Community Resource Referral / Chronic Care Management:  CRR required this visit?  No   CCM required this visit?  No     Plan:     I have personally reviewed and noted the following in the patient's chart:   Medical and social history Use of alcohol, tobacco or illicit drugs  Current medications and supplements including opioid prescriptions. Patient is not currently taking opioid prescriptions. Functional ability and status Nutritional status Physical activity Advanced directives List of other physicians Hospitalizations, surgeries, and ER visits in previous 12 months Vitals Screenings to include cognitive, depression, and falls Referrals and appointments  In addition, I have reviewed and discussed with patient certain preventive protocols, quality metrics, and best practice recommendations. A written personalized care plan for preventive services as well as general preventive health recommendations were provided to patient.     Rojelio LELON Blush, LPN   07/07/7973   After Visit Summary: (In Person-Printed) AVS printed and given to the patient  Nurse Notes: None

## 2023-12-16 ENCOUNTER — Other Ambulatory Visit: Payer: Self-pay | Admitting: Family Medicine

## 2023-12-16 MED ORDER — CLOTRIMAZOLE-BETAMETHASONE 1-0.05 % EX CREA: 1.0000 | TOPICAL_CREAM | Freq: Every day | CUTANEOUS | 0 refills | Status: DC

## 2023-12-16 NOTE — Telephone Encounter (Signed)
 Copied from CRM [id number]. Topic: Clinical - Medication Refill >> Dec 16, 2023 10:22 AM Deaijah H wrote: Most Recent Primary Care Visit:  Provider: TANDA ROJELIO ORN  Department: [facility]  Visit Type: MEDICARE AWV, SEQUENTIAL  Date: 12/05/2023  Medication: ***  Has the patient contacted their pharmacy?  (Agent: If no, request that the patient contact the pharmacy for the refill. If patient does not wish to contact the pharmacy document the reason why and proceed with request.) (Agent: If yes, when and what did the pharmacy advise?)  Is this the correct pharmacy for this prescription?  If no, delete pharmacy and type the correct one.  This is the patient's preferred pharmacy:  [facility] - [place], [place] - [address] [place] [address] Phone: [phone] Fax: [phone]   Has the prescription been filled recently?   Is the patient out of the medication?   Has the patient been seen for an appointment in the last year OR does the patient have an upcoming appointment?   Can we respond through MyChart?   Agent: Please be advised that Rx refills may take up to 3 business days. We ask that you follow-up with your pharmacy.

## 2023-12-17 ENCOUNTER — Ambulatory Visit: Payer: Medicare HMO | Admitting: Family

## 2024-04-07 ENCOUNTER — Ambulatory Visit (INDEPENDENT_AMBULATORY_CARE_PROVIDER_SITE_OTHER): Admitting: Family Medicine

## 2024-04-07 ENCOUNTER — Encounter: Payer: Self-pay | Admitting: Family Medicine

## 2024-04-07 VITALS — BP 80/40 | HR 76 | Temp 97.9°F | Resp 16 | Ht 70.0 in | Wt 163.4 lb

## 2024-04-07 DIAGNOSIS — I959 Hypotension, unspecified: Secondary | ICD-10-CM | POA: Diagnosis not present

## 2024-04-07 DIAGNOSIS — H6122 Impacted cerumen, left ear: Secondary | ICD-10-CM

## 2024-04-07 DIAGNOSIS — B372 Candidiasis of skin and nail: Secondary | ICD-10-CM

## 2024-04-07 MED ORDER — CLOTRIMAZOLE-BETAMETHASONE 1-0.05 % EX CREA: 1.0000 | TOPICAL_CREAM | Freq: Every day | CUTANEOUS | 1 refills | Status: DC | PRN

## 2024-04-07 NOTE — Patient Instructions (Addendum)
 A few things to remember from today's visit:  Hearing loss of left ear due to cerumen impaction  Intertriginous candidiasis - Plan: clotrimazole -betamethasone  (LOTRISONE ) cream Avoid Q tips. Left ear is cleaned now. Be sure to stay adequately hydrated. You can try 4 ounces of Gatorade daily.  Regards to the rash, recommend using Desitin cream at night and continue Lotrisone  daily as needed.  If you need refills for medications you take chronically, please call your pharmacy. Do not use My Chart to request refills or for acute issues that need immediate attention. If you send a my chart message, it may take a few days to be addressed, specially if I am not in the office.  Please be sure medication list is accurate. If a new problem present, please set up appointment sooner than planned today.

## 2024-04-07 NOTE — Progress Notes (Unsigned)
 ACUTE VISIT Chief Complaint  Patient presents with   Hearing Problem    With cracking and popping for a couple of days   HPI: [name] is a 73 y.o. male with a PMHx significant for CAD, aortic atherosclerosis, HLD, HTN, and chronic pain, who is here today complaining of hearing problems in his left ear.   Patient complains of popping sensation and cracking noise in his left ear for a few days. Also endorses some hearing loss since yesterday morning.  He has not tried anything OTC for the problem.  Pertinent negatives include recent URI, congestion, rhinorrhea, ear ache, tinnitus, or recent travel.   Review of Systems See other pertinent positives and negatives in HPI.  Current Outpatient Medications on File Prior to Visit  Medication Sig Dispense Refill   ezetimibe  (ZETIA ) 10 MG tablet Take 1 tablet (10 mg total) by mouth daily. 90 tablet 3   HYDROcodone -acetaminophen  (NORCO) 10-325 MG tablet Take 1 tablet by mouth every 6 (six) hours as needed for moderate pain. (Patient taking differently: Take 1 tablet by mouth every 6 (six) hours.) 40 tablet 0   levothyroxine (SYNTHROID) 125 MCG tablet Take 125 mcg by mouth daily.     nitroGLYCERIN  (NITROSTAT ) 0.4 MG SL tablet DISSOLVE 1 TABLET UNDER TONGUE AT ONSET OF CHEST PAIN. MAY REPEAT DOSE EVERY 5 MINUTES UP TO 3 DOSES AS NEEDED. IF PAIN NOT RELIEVED, CALL 911. Strength: 0.4 mg 25 tablet 3   pregabalin  (LYRICA ) 200 MG capsule Take 200 mg by mouth at bedtime.     Probiotic Product (PROBIOTIC DAILY PO) Take 1 tablet by mouth daily.     zolpidem  (AMBIEN ) 5 MG tablet Take 5-10 mg by mouth at bedtime as needed for sleep.     rosuvastatin  (CRESTOR ) 40 MG tablet Take 1 tablet (40 mg total) by mouth daily. 90 tablet 3   No current facility-administered medications on file prior to visit.   Past Medical History:  Diagnosis Date   Abnormal gait    Allergic rhinitis, cause unspecified 07/18/2008   Back pain, chronic 10/01/2011    CAD (coronary artery disease)    Carpal tunnel syndrome on left    Chronic low back pain 12/25/2012   Degenerative arthritis of right knee 10/01/2011   Diverticulosis of colon (without mention of hemorrhage) 07/18/2008   Gross hematuria 07/18/2008   Headache(784.0)    Hx: of Migraines as a child   Hyperlipidemia 07/18/2008   Hypertension    Idiopathic progressive neuropathy    Personal history of colonic polyps 07/18/2008   PVD (peripheral vascular disease) (HCC)    Tobacco use disorder 04/06/2009   No Known Allergies  Social History   Socioeconomic History   Marital status: Married    Spouse name: Donald Roberts   Number of children: 1   Years of education: Not on file   Highest education level: High school graduate  Occupational History   Occupation: Surveyor, minerals and Midwife    Comment: semi retired  Tobacco Use   Smoking status: Former    Current packs/day: 0.50    Average packs/day: 0.5 packs/day for 40.0 years (20.0 ttl pk-yrs)    Types: Cigarettes   Smokeless tobacco: Never  Vaping Use   Vaping status: Never Used  Substance and Sexual Activity   Alcohol use: Yes    Alcohol/week: 2.0 standard drinks of alcohol    Types: 2 Cans of beer per week    Comment: occasional   Drug use: No   Sexual  activity: Not Currently  Other Topics Concern   Not on file  Social History Narrative   Married, 1 daughter lives close by.   Spends time at [employer] in [place].   Social Drivers of Corporate investment banker Strain: Low Risk  (12/05/2023)   Overall Financial Resource Strain (CARDIA)    Difficulty of Paying Living Expenses: Not hard at all  Food Insecurity: Low Risk  (04/01/2024)   Received from [facility]   Hunger Vital Sign    Worried About Running Out of Food in the Last Year: Never true    Ran Out of Food in the Last Year: Never true  Transportation Needs: No Transportation Needs (04/01/2024)   Received from [facility]    In the past 12 months,  has lack of reliable transportation kept you from medical appointments, meetings, work or from getting things needed for daily living? : No  Physical Activity: Inactive (12/05/2023)   Exercise Vital Sign    Days of Exercise per Week: 0 days    Minutes of Exercise per Session: 0 min  Stress: No Stress Concern Present (12/05/2023)   [employer] of Occupational Health - Occupational Stress Questionnaire    Feeling of Stress : Not at all  Social Connections: Socially Integrated (12/05/2023)   Social Connection and Isolation Panel [NHANES]    Frequency of Communication with Friends and Family: More than three times a week    Frequency of Social Gatherings with Friends and Family: More than three times a week    Attends Religious Services: More than 4 times per year    Active Member of [employer] or Organizations: Yes    Attends Banker Meetings: More than 4 times per year    Marital Status: Married   Vitals:   04/07/24 1439  BP: (!) 80/40  Pulse: 76  Resp: 16  Temp: 97.9 F (36.6 C)  SpO2: 97%   Body mass index is 23.45 kg/m.  Physical Exam Vitals and nursing note reviewed.  Constitutional:      General: He is not in acute distress.    Appearance: He is well-developed. He is not ill-appearing.  HENT:     Head: Normocephalic and atraumatic.     Right Ear: Tympanic membrane, ear canal and external ear normal.     Left Ear: External ear normal.     Ears:     Comments: Significant cerumen in the left ear.     Mouth/Throat:     Mouth: Mucous membranes are moist.     Pharynx: Oropharynx is clear. Uvula midline.  Eyes:     Conjunctiva/sclera: Conjunctivae normal.  Cardiovascular:     Rate and Rhythm: Normal rate and regular rhythm.     Heart sounds: No murmur heard. Pulmonary:     Effort: Pulmonary effort is normal. No respiratory distress.     Breath sounds: Normal breath sounds. No stridor.  Lymphadenopathy:     Head:     Right side of head: No submandibular  adenopathy.     Left side of head: No submandibular adenopathy.     Cervical: No cervical adenopathy.  Skin:    General: Skin is warm.     Findings: No erythema or rash.  Neurological:     General: No focal deficit present.     Mental Status: He is alert and oriented to person, place, and time.  Psychiatric:        Mood and Affect: Mood and  affect normal.   ASSESSMENT AND PLAN:  Mr. Brinkerhoff was seen today for left ear problems.   Hearing loss of left ear due to cerumen impaction  Intertriginous candidiasis -     Clotrimazole -Betamethasone ; Apply 1 Application topically daily as needed.  Dispense: 30 g; Refill: 1  Hypotension, unspecified hypotension type  Encourage adequate hydration. He can try Gatorade or adding small amount of salt to meals. He is asymptomatic. Instructed about warning signs.   Return if symptoms worsen or fail to improve, for keep next appointment.  I, Fritz Jewel Wierda, acting as a scribe for Kalaya Infantino [place], MD., have documented all relevant documentation on the behalf of Mardell Cragg [place], MD, as directed by  Jameison Haji [place], MD while in the presence of Kinston Magnan [place], MD.   I, Woodson Macha [place], MD, have reviewed all documentation for this visit. The documentation on 04/07/24 for the exam, diagnosis, procedures, and orders are all accurate and complete.  Patton Swisher G. [place], MD  [facility] [place] office.

## 2024-04-08 DIAGNOSIS — H6122 Impacted cerumen, left ear: Secondary | ICD-10-CM | POA: Diagnosis not present

## 2024-05-17 ENCOUNTER — Telehealth: Payer: Self-pay | Admitting: Cardiology

## 2024-05-17 DIAGNOSIS — I2510 Atherosclerotic heart disease of native coronary artery without angina pectoris: Secondary | ICD-10-CM

## 2024-05-17 DIAGNOSIS — E7800 Pure hypercholesterolemia, unspecified: Secondary | ICD-10-CM

## 2024-05-17 NOTE — Telephone Encounter (Signed)
*  STAT* If patient is at the pharmacy, call can be transferred to refill team.   1. Which medications need to be refilled? (please list name of each medication and dose if known)   2. Which pharmacy/location (including street and city if local pharmacy) is medication to be sent to? [facility] - [place], [place] - [address] Phone: [phone]  Fax: [phone]     3. Do they need a 30 day or 90 day supply?

## 2024-05-18 ENCOUNTER — Other Ambulatory Visit: Payer: Self-pay | Admitting: Cardiology

## 2024-05-18 DIAGNOSIS — I2510 Atherosclerotic heart disease of native coronary artery without angina pectoris: Secondary | ICD-10-CM

## 2024-05-18 MED ORDER — EZETIMIBE 10 MG PO TABS: 10.0000 mg | ORAL_TABLET | Freq: Every day | ORAL | 0 refills | Status: DC

## 2024-05-18 MED ORDER — ROSUVASTATIN CALCIUM 40 MG PO TABS: 40.0000 mg | ORAL_TABLET | Freq: Every day | ORAL | 0 refills | Status: AC

## 2024-05-18 NOTE — Telephone Encounter (Signed)
 Pt's medications were sent to pt's pharmacy as requested. Confirmation received.

## 2024-08-06 ENCOUNTER — Emergency Department (HOSPITAL_COMMUNITY)

## 2024-08-06 ENCOUNTER — Encounter (HOSPITAL_COMMUNITY): Payer: Self-pay

## 2024-08-06 ENCOUNTER — Other Ambulatory Visit: Payer: Self-pay

## 2024-08-06 ENCOUNTER — Emergency Department (HOSPITAL_COMMUNITY)
Admission: EM | Admit: 2024-08-06 | Discharge: 2024-08-06 | Disposition: A | Discharge status: Home / Self Care | Attending: Emergency Medicine | Admitting: Emergency Medicine

## 2024-08-06 DIAGNOSIS — Z955 Presence of coronary angioplasty implant and graft: Secondary | ICD-10-CM | POA: Diagnosis not present

## 2024-08-06 DIAGNOSIS — E785 Hyperlipidemia, unspecified: Secondary | ICD-10-CM

## 2024-08-06 DIAGNOSIS — R61 Generalized hyperhidrosis: Secondary | ICD-10-CM | POA: Diagnosis not present

## 2024-08-06 DIAGNOSIS — F17200 Nicotine dependence, unspecified, uncomplicated: Secondary | ICD-10-CM | POA: Insufficient documentation

## 2024-08-06 DIAGNOSIS — R079 Chest pain, unspecified: Secondary | ICD-10-CM | POA: Diagnosis present

## 2024-08-06 DIAGNOSIS — I2510 Atherosclerotic heart disease of native coronary artery without angina pectoris: Secondary | ICD-10-CM | POA: Diagnosis not present

## 2024-08-06 DIAGNOSIS — R110 Nausea: Secondary | ICD-10-CM | POA: Insufficient documentation

## 2024-08-06 DIAGNOSIS — I10 Essential (primary) hypertension: Secondary | ICD-10-CM | POA: Diagnosis not present

## 2024-08-06 LAB — I-STAT CHEM 8, ED
BUN: 27 mg/dL — ABNORMAL HIGH (ref 8–23)
Calcium, Ion: 1.13 mmol/L — ABNORMAL LOW (ref 1.15–1.40)
Chloride: 106 mmol/L (ref 98–111)
Creatinine, Ser: 0.8 mg/dL (ref 0.61–1.24)
Glucose, Bld: 64 mg/dL — ABNORMAL LOW (ref 70–99)
HCT: 37 % — ABNORMAL LOW (ref 39.0–52.0)
Hemoglobin: 12.6 g/dL — ABNORMAL LOW (ref 13.0–17.0)
Potassium: 4.2 mmol/L (ref 3.5–5.1)
Sodium: 137 mmol/L (ref 135–145)
TCO2: 18 mmol/L — ABNORMAL LOW (ref 22–32)

## 2024-08-06 LAB — BASIC METABOLIC PANEL WITH GFR
Anion gap: 15 (ref 5–15)
BUN: 23 mg/dL (ref 8–23)
CO2: 16 mmol/L — ABNORMAL LOW (ref 22–32)
Calcium: 8.4 mg/dL — ABNORMAL LOW (ref 8.9–10.3)
Chloride: 105 mmol/L (ref 98–111)
Creatinine, Ser: 1 mg/dL (ref 0.61–1.24)
GFR, Estimated: >60 mL/min (ref >60)
Glucose, Bld: 66 mg/dL — ABNORMAL LOW (ref 70–99)
Potassium: 4 mmol/L (ref 3.5–5.1)
Sodium: 136 mmol/L (ref 135–145)

## 2024-08-06 LAB — CBC
HCT: 38.4 % — ABNORMAL LOW (ref 39.0–52.0)
Hemoglobin: 12.4 g/dL — ABNORMAL LOW (ref 13.0–17.0)
MCH: 28.5 pg (ref 26.0–34.0)
MCHC: 32.3 g/dL (ref 30.0–36.0)
MCV: 88.3 fL (ref 80.0–100.0)
Platelets: 228 K/uL (ref 150–400)
RBC: 4.35 MIL/uL (ref 4.22–5.81)
RDW: 13.3 % (ref 11.5–15.5)
WBC: 7 K/uL (ref 4.0–10.5)
nRBC: 0 % (ref 0.0–0.2)

## 2024-08-06 LAB — TROPONIN I (HIGH SENSITIVITY)
Troponin I (High Sensitivity): 9 ng/L (ref <18)
Troponin I (High Sensitivity): 14 ng/L (ref <18)

## 2024-08-06 MED ORDER — SODIUM CHLORIDE 0.9 % IV BOLUS
1000.0000 mL | Freq: Once | INTRAVENOUS | Status: AC
  Administered 2024-08-06: 1000 mL via INTRAVENOUS

## 2024-08-06 MED ORDER — SCOPOLAMINE 1 MG/3DAYS TD PT72
1.0000 | MEDICATED_PATCH | TRANSDERMAL | Status: DC
  Administered 2024-08-06: 1 mg via TRANSDERMAL
  Filled 2024-08-06: qty 1

## 2024-08-06 MED ORDER — OXYCODONE-ACETAMINOPHEN 5-325 MG PO TABS
1.0000 | ORAL_TABLET | Freq: Once | ORAL | Status: AC
  Administered 2024-08-06: 1 via ORAL
  Filled 2024-08-06: qty 1

## 2024-08-06 MED ORDER — MECLIZINE HCL 25 MG PO TABS: 25.0000 mg | ORAL_TABLET | Freq: Once | ORAL | Status: DC

## 2024-08-06 NOTE — Discharge Instructions (Signed)
 You were seen for your chest pain in the emergency department.   At home, please take Tylenol  and nitroglycerin  for your pain.    Follow-up with your primary doctor in 2-3 days regarding your visit.  Cardiology will be calling you regarding an appointment within the next 72 hours.  You may contact them if you do not hear from them in that time using the information in this packet.  Return immediately to the emergency department if you experience any of the following: Worsening pain, difficulty breathing, unexplained vomiting or sweating, or any other concerning symptoms.    Thank you for visiting our Emergency Department. It was a pleasure taking care of you today.

## 2024-08-06 NOTE — ED Triage Notes (Signed)
 Pt from home with ems, diagnosed with covid 2 weeks ago. Pt c.o intermittent chest pain since. Took 3 nitroglycerin  at home. Pain has now resolved.  Pain worse with movement and when he coughs.  Pt a/ox4 Pt given fluids PTA 18G LFA

## 2024-08-06 NOTE — Consult Note (Signed)
 Cardiology Consultation   Patient ID: Donald Roberts MRN: [id number]; DOB: 10/20/1951  Admit date: 08/06/2024 Date of Consult: 08/06/2024  PCP:  [place], Betty G, MD   [facility] Providers Cardiologist:  Redell Shallow, MD       Patient Profile: Donald Roberts is a 73 y.o. male with a hx of Hypertension, Hyperlipidemia, PVD, tobacco abuse, ascending aortic dilation, emphysema, nonobstructive CAD who is being seen 08/06/2024 for the evaluation of chest pain at the request of Donald Shan MD.  History of Present Illness: [name] is a 73 y.o. male with prior cardiac history listed below.  Cardiac catheterization in 11/2018 showed 25% stenosis in the proximal LAD.  Patient was seen by Dr. Shallow on 08/2019 for chest pain and dyspnea on exertion.  The chest pain was felt to be somewhat atypical and with the recent cath showing mild CAD no further ischemic evaluation was pursued at that time.  Abdominal ultrasound on 01/2020 showed 50% stenosis in the left common iliac and external illiac arteries.  Chest CT for routine follow-up on 01/2022 showed ascending aortic dilation measuring 3.9 cm.  This was similar to the prior CT TEE in 2021.  Echocardiogram was performed on 06/2022 showed an LVEF of 60 to 65%, no RWMA, mild concentric LVH, mild to moderate MAC, trivial MR, and aortic valve calcification without evidence of stenosis. cardiac catheterization on was done 01/2023 because of worsening chest pain and dyspnea that showed 45% stenosis in the mid to distal LAD, 55% stenosis in the mid RCA, and a normal LVEDP.  At that time the patient symptoms were felt to be noncardiac in nature and the patients CAD was managed medically.  At last outpatient follow-up with Dr. Shallow on 07/2023 the patient was complaining of ongoing fatigue but denied any symptoms of claudication.  At this follow-up zetia  was added because LDL was not at goal.  Patient was reportedly diagnosed with COVID 2  weeks ago  Patient presented to the ER with severe substernal pressure that improved with nitroglycerin .   On interview patient reported that he had left-sided substernal chest tightness that woke him up from his sleep at about 5 AM.  He took nitroglycerin  at 8, 10, and 10:30 AM.  In addition to the chest tightness he had nausea and left-sided arm pain.  He does also have osteoarthritis and also has arm pain with that.  Denied any worsening shortness of breath, diaphoresis, vomiting, lower extremity edema, fever, chills.  Has had some shortness of breath and gasping with his COVID over the past 2 weeks.  Has also been feeling dizzy.  Patient was concerned because he had a brother who passed away in his 10s from an MI and multiple other family members who had MI's.  Denies any recent alcohol use, tobacco use, illicit substance use.  Can walk around larger stores but does limited exertional activities primarily because of his back pain.  Has not noticed any recent changes in exertional ability.  Labs showed potassium of 4, decreased bicarb of 16, hypocalcemia of 8.4, creatinine of 1.00, normal high-sensitivity troponin of 9, and anemia with a hemoglobin of 12.4.  EKG showed normal sinus rhythm with a rate of 60 inferior and lateral T wave flattening seen on prior EKG.  Chest x-ray showed No active cardiopulmonary disease   Past Medical History:  Diagnosis Date   Abnormal gait    Allergic rhinitis, cause unspecified 07/18/2008   Back pain, chronic 10/01/2011   CAD (coronary  artery disease)    Carpal tunnel syndrome on left    Chronic low back pain 12/25/2012   Degenerative arthritis of right knee 10/01/2011   Diverticulosis of colon (without mention of hemorrhage) 07/18/2008   Gross hematuria 07/18/2008   Headache(784.0)    Hx: of Migraines as a child   Hyperlipidemia 07/18/2008   Hypertension    Idiopathic progressive neuropathy    Personal history of colonic polyps 07/18/2008   PVD  (peripheral vascular disease) (HCC)    Tobacco use disorder 04/06/2009    Past Surgical History:  Procedure Laterality Date   ANTERIOR CERVICAL DECOMP/DISCECTOMY FUSION N/A 09/10/2013   Procedure: ANTERIOR CERVICAL DECOMPRESSION/DISCECTOMY FUSION CERVICAL FOUR-FIVE,CERVICAL FIVE-SIX WITH PLATING AND BONE GRAFT;  Surgeon: Catalina CHRISTELLA Stains, MD;  Location: [facility];  Service: Neurosurgery;  Laterality: N/A;   ANTERIOR CERVICAL DECOMP/DISCECTOMY FUSION N/A 03/14/2015   Procedure: ANTERIOR CERVICAL DECOMPRESSION/DISCECTOMY FUSION  CERVICAL TWO-THREE;  Surgeon: Catalina Stains, MD;  Location: [facility];  Service: Neurosurgery;  Laterality: N/A;   ANTERIOR CERVICAL DECOMP/DISCECTOMY FUSION N/A 10/03/2020   Procedure: Anterior Cervial Discectomy and Fusion Cervical three and cervical four;  Surgeon: Louis Shove, MD;  Location: [facility] OR;  Service: Neurosurgery;  Laterality: N/A;  3C   CARDIAC CATHETERIZATION     COLONOSCOPY     COLONOSCOPY W/ BIOPSIES AND POLYPECTOMY     Hx: of   LEFT HEART CATH AND CORONARY ANGIOGRAPHY N/A 11/04/2018   Procedure: LEFT HEART CATH AND CORONARY ANGIOGRAPHY;  Surgeon: Claudene Pacific, MD;  Location: [facility] CV [facility];  Service: Cardiovascular;  Laterality: N/A;   LEFT HEART CATH AND CORONARY ANGIOGRAPHY N/A 01/15/2023   Procedure: LEFT HEART CATH AND CORONARY ANGIOGRAPHY;  Surgeon: Anner Alm ORN, MD;  Location: [facility] INVASIVE CV LAB;  Service: Cardiovascular;  Laterality: N/A;   lumbar surgury     dr botero/ns; 3 level fusion per pt   POLYPECTOMY     s/p cervical disease x 2       Home Medications:  Prior to Admission medications   Medication Sig Start Date End Date Taking? Authorizing Provider  clotrimazole -betamethasone  (LOTRISONE ) cream Apply 1 Application topically daily as needed. 04/07/24   [place], Betty G, MD  ezetimibe  (ZETIA ) 10 MG tablet Take 1 tablet (10 mg total) by mouth daily. 05/18/24   Pietro Redell RAMAN, MD  HYDROcodone -acetaminophen  (NORCO) 10-325 MG  tablet Take 1 tablet by mouth every 6 (six) hours as needed for moderate pain. Patient taking differently: Take 1 tablet by mouth every 6 (six) hours. 10/03/20   Louis Shove, MD  levothyroxine (SYNTHROID) 125 MCG tablet Take 125 mcg by mouth daily. 06/16/23   [provider]  nitroGLYCERIN  (NITROSTAT ) 0.4 MG SL tablet DISSOLVE 1 TABLET UNDER THE TONGUE EVERY 5 MINUTES FOR UP TO 3 DOSES AS NEEDED FOR CHEST PAIN. IF NO RELIEF AFTER 3 DOSES, CALL 911 OR GO TO ER. 05/18/24   Pietro Redell RAMAN, MD  pregabalin  (LYRICA ) 200 MG capsule Take 200 mg by mouth at bedtime. 08/15/22   [provider]  Probiotic Product (PROBIOTIC DAILY PO) Take 1 tablet by mouth daily.    [provider]  rosuvastatin  (CRESTOR ) 40 MG tablet Take 1 tablet (40 mg total) by mouth daily. 05/18/24   Pietro Redell RAMAN, MD  zolpidem  (AMBIEN ) 5 MG tablet Take 5-10 mg by mouth at bedtime as needed for sleep.    [provider]    Scheduled Meds:  Continuous Infusions:  PRN Meds:   Allergies:  No Known Allergies  Social History:   Social History   Socioeconomic History   Marital status: Married    Spouse name: Erminio   Number of children: 1   Years of education: Not on file   Highest education level: High school graduate  Occupational History   Occupation: Surveyor, minerals and Midwife    Comment: semi retired  Tobacco Use   Smoking status: Former    Current packs/day: 0.50    Average packs/day: 0.5 packs/day for 40.0 years (20.0 ttl pk-yrs)    Types: Cigarettes   Smokeless tobacco: Never  Vaping Use   Vaping status: Never Used  Substance and Sexual Activity   Alcohol use: Yes    Alcohol/week: 2.0 standard drinks of alcohol    Types: 2 Cans of beer per week    Comment: occasional   Drug use: No   Sexual activity: Not Currently  Other Topics Concern   Not on file  Social History Narrative   Married, 1 daughter lives close by.   Spends time at [employer] in [place].   Social  Drivers of Corporate investment banker Strain: Low Risk  (12/05/2023)   Overall Financial Resource Strain (CARDIA)    Difficulty of Paying Living Expenses: Not hard at all  Food Insecurity: Low Risk  (04/01/2024)   Received from [facility]   Hunger Vital Sign    Within the past 12 months, you worried that your food would run out before you got money to buy more: Never true    Within the past 12 months, the food you bought just didn't last and you didn't have money to get more. : Never true  Transportation Needs: No Transportation Needs (04/01/2024)   Received from [facility]    In the past 12 months, has lack of reliable transportation kept you from medical appointments, meetings, work or from getting things needed for daily living? : No  Physical Activity: Inactive (12/05/2023)   Exercise Vital Sign    Days of Exercise per Week: 0 days    Minutes of Exercise per Session: 0 min  Stress: No Stress Concern Present (12/05/2023)   [employer] of Occupational Health - Occupational Stress Questionnaire    Feeling of Stress : Not at all  Social Connections: Socially Integrated (12/05/2023)   Social Connection and Isolation Panel    Frequency of Communication with Friends and Family: More than three times a week    Frequency of Social Gatherings with Friends and Family: More than three times a week    Attends Religious Services: More than 4 times per year    Active Member of [employer] or Organizations: Yes    Attends Banker Meetings: More than 4 times per year    Marital Status: Married  Catering manager Violence: Not At Risk (12/05/2023)   Humiliation, Afraid, Rape, and Kick questionnaire    Fear of Current or Ex-Partner: No    Emotionally Abused: No    Physically Abused: No    Sexually Abused: No    Family History:    Family History  Problem Relation Age of Onset   Heart disease Mother    Heart attack Father    Heart disease Father    Cancer Sister     Diabetes Sister    Heart disease Sister    Heart disease Brother    Parkinsonism Paternal Uncle    Heart disease Other    Colon cancer Neg Hx  Colon polyps Neg Hx    Esophageal cancer Neg Hx    Rectal cancer Neg Hx    Stomach cancer Neg Hx      ROS:  Please see the history of present illness.   All other ROS reviewed and negative.     Physical Exam/Data: Vitals:   08/06/24 1230 08/06/24 1245 08/06/24 1300 08/06/24 1315  BP: (!) 105/49 101/69 135/68 139/70  Pulse: (!) 52 (!) 53 (!) 49 (!) 53  Resp: 12 15 15 14   Temp:      TempSrc:      SpO2: 99% 100% 94% 96%  Weight:      Height:       No intake or output data in the 24 hours ending 08/06/24 1504    08/06/2024   11:49 AM 04/07/2024    2:39 PM 12/05/2023    2:57 PM  Last 3 Weights  Weight (lbs) 155 lb 163 lb 6.4 oz 170 lb 4.8 oz  Weight (kg) 70.308 kg 74.118 kg 77.248 kg     Body mass index is 22.24 kg/m.  General:  Well nourished, well developed, in no acute distress, appearing about stated age, alert and orientated on room air. HEENT: normal Neck: no JVD Vascular: No carotid bruits; Distal pulses 2+ bilaterally Cardiac:  normal S1, S2; RRR; no murmur.  Lungs:  clear to auscultation bilaterally, no wheezing, rhonchi or rales  Abd: soft, nontender, no hepatomegaly  Ext: no edema Musculoskeletal:  No deformities, BUE and BLE strength normal and equal Skin: warm and dry  Neuro:  CNs 2-12 intact, no focal abnormalities noted Psych:  Normal affect   EKG:  The EKG was personally reviewed and demonstrates:  normal sinus rhythm with a rate of 60 inferior and lateral T wave flattening seen on prior EKG Telemetry:  Telemetry was personally reviewed and demonstrates:  normal sinus rhythm with heart rates in the 50's to 80's  Relevant CV Studies:   Laboratory Data: High Sensitivity Troponin:   Recent Labs  Lab 08/06/24 1237  TROPONINIHS 9     Chemistry Recent Labs  Lab 08/06/24 1234 08/06/24 1237  NA 137 136  K  4.2 4.0  CL 106 105  CO2  --  16*  GLUCOSE 64* 66*  BUN 27* 23  CREATININE 0.80 1.00  CALCIUM   --  8.4*  GFRNONAA  --  >60  ANIONGAP  --  15    No results for input(s): PROT, ALBUMIN, AST, ALT, ALKPHOS, BILITOT in the last 168 hours. Lipids No results for input(s): CHOL, TRIG, HDL, LABVLDL, LDLCALC, CHOLHDL in the last 168 hours.  Hematology Recent Labs  Lab 08/06/24 1234 08/06/24 1237  WBC  --  7.0  RBC  --  4.35  HGB 12.6* 12.4*  HCT 37.0* 38.4*  MCV  --  88.3  MCH  --  28.5  MCHC  --  32.3  RDW  --  13.3  PLT  --  228   Thyroid  No results for input(s): TSH, FREET4 in the last 168 hours.  BNPNo results for input(s): BNP, PROBNP in the last 168 hours.  DDimer No results for input(s): DDIMER in the last 168 hours.  Radiology/Studies:  DG Chest Portable 1 View Result Date: 08/06/2024 CLINICAL DATA:  Chest pain, history of COVID-19 2 weeks ago EXAM: PORTABLE CHEST 1 VIEW COMPARISON:  Chest CT October 22, 2023 FINDINGS: The heart size and mediastinal contours are within normal limits. Both lungs are clear. No suspicious nodule or consolidation.  The visualized skeletal structures are unremarkable. IMPRESSION: No active cardiopulmonary disease. Electronically Signed   By: Megan  Zare M.D.   On: 08/06/2024 12:20     Assessment and Plan:  Witten Certain is a 73 y.o. male with a hx of Hypertension, Hyperlipidemia, PVD, tobacco abuse, ascending aortic dilation 3.9cm, emphysema, nonobstructive CAD who is being seen 08/06/2024 for the evaluation of chest pain at the request of Donald Shan MD.  Chest pain Nonobstructive CAD Hyperlipidemia Had left-sided substernal chest tightness that woke him up from his sleep at about 5 AM.  He took nitroglycerin  and that helped his chest tightness.  In addition to the chest tightness he had nausea and left-sided arm pain.  He does also have osteoarthritis and also has arm pain with that.  Denied any  worsening shortness of breath, diaphoresis, vomiting, lower extremity edema, fever, chills.  Has had some shortness of breath and gasping with his COVID over the past 2 weeks.   Had a cardiac catheterization done on 01/2023 that showed 45% stenosis in the mid to distal LAD, 55% stenosis in the mid RCA, and a normal LVEDP.  At that time the patient symptoms were felt to be noncardiac in nature and the patients CAD was managed medically. High-sensitivity troponins negative x 2.  EKG showed normal sinus rhythm with a rate of 60 inferior and lateral T wave flattening seen on prior EKGs. Patient's chest pain has some typical and atypical features for ACS. Has had multiple episodes of recurrent chest pain in the past that were noncardiac in nature.  With negative troponins and somewhat recent cardiac catheterization on 01/2023 showing nonobstructive CAD no further ischemic evaluation is needed at this time. Continue Zetia  10 mg daily Continue Crestor  40 mg daily Continue as needed nitroglycerin .  Reported the last time he had chest pain like this was 3 months ago. Plan on outpatient follow-up Follow-up outpatient with Dr. Pietro   Hypertension Blood pressures in the hospital have been labile but slightly elevated at times.  Most recent BP 144/73 does not appear like he is on any blood pressure medications at home.  Recommend keeping a blood pressure log   Otherwise manage per primary  Risk Assessment/Risk Scores:           [facility] will sign off.   The patient is ready for discharge today from a cardiac standpoint. Medication Recommendations: None Other recommendations (labs, testing, etc): None Follow up as an outpatient: Follow-up with Dr. Pietro  For questions or updates, please contact [facility] Please consult [email or web address] for contact info under    Signed, Morse Clause, PA-C  08/06/2024 3:04 PM

## 2024-08-06 NOTE — ED Provider Notes (Signed)
 [place] EMERGENCY DEPARTMENT AT [facility] Provider Note   CSN: [id number] Arrival date & time: 08/06/24  1144     Patient presents with: Chest Pain   Donald Roberts is a 73 y.o. male.   73 year old male with history of prior tobacco use and nonobstructive CAD and prior tobacco use who presents to the emergency department chest discomfort.  Patient reports that he is getting over COVID for the past 2 weeks.  Says that this morning he woke up at 5 AM and had severe substernal pressure.  Says that he took 3 nitroglycerin  over the course of several hours and it has resolved.  Did have some diaphoresis with it.  Nausea but no vomiting.  Did not try walking during the episode.  Says he is feeling much better now.  Father had an MI in his 58s.  Brother had an MI at the age of 72.  Did have a left heart cath on 2/24 that showed 45% mid to distal LAD occlusion and 55% stenosis in RCA.        Prior to Admission medications   Medication Sig Start Date End Date Taking? Authorizing Provider  clotrimazole -betamethasone  (LOTRISONE ) cream Apply 1 Application topically daily as needed. 04/07/24   [place], Betty G, MD  ezetimibe  (ZETIA ) 10 MG tablet Take 1 tablet (10 mg total) by mouth daily. 05/18/24   Pietro Redell RAMAN, MD  HYDROcodone -acetaminophen  (NORCO) 10-325 MG tablet Take 1 tablet by mouth every 6 (six) hours as needed for moderate pain. Patient taking differently: Take 1 tablet by mouth every 6 (six) hours. 10/03/20   Louis Shove, MD  levothyroxine (SYNTHROID) 125 MCG tablet Take 125 mcg by mouth daily. 06/16/23   [provider]  nitroGLYCERIN  (NITROSTAT ) 0.4 MG SL tablet DISSOLVE 1 TABLET UNDER THE TONGUE EVERY 5 MINUTES FOR UP TO 3 DOSES AS NEEDED FOR CHEST PAIN. IF NO RELIEF AFTER 3 DOSES, CALL 911 OR GO TO ER. 05/18/24   Pietro Redell RAMAN, MD  pregabalin  (LYRICA ) 200 MG capsule Take 200 mg by mouth at bedtime. 08/15/22   [provider]  Probiotic Product  (PROBIOTIC DAILY PO) Take 1 tablet by mouth daily.    [provider]  rosuvastatin  (CRESTOR ) 40 MG tablet Take 1 tablet (40 mg total) by mouth daily. 05/18/24   Pietro Redell RAMAN, MD  zolpidem  (AMBIEN ) 5 MG tablet Take 5-10 mg by mouth at bedtime as needed for sleep.    [provider]    Allergies: Patient has no known allergies.    Review of Systems  Updated Vital Signs BP (!) 144/73   Pulse (!) 50   Temp (!) 97.2 F (36.2 C) (Oral)   Resp 17   Ht 5' 10 (1.778 m)   Wt 70.3 kg   SpO2 98%   BMI 22.24 kg/m   Physical Exam Vitals and nursing note reviewed.  Constitutional:      General: He is not in acute distress.    Appearance: He is well-developed.  HENT:     Head: Normocephalic and atraumatic.     Right Ear: External ear normal.     Left Ear: External ear normal.     Nose: Nose normal.  Eyes:     Extraocular Movements: Extraocular movements intact.     Conjunctiva/sclera: Conjunctivae normal.     Pupils: Pupils are equal, round, and reactive to light.  Cardiovascular:     Rate and Rhythm: Normal rate and regular rhythm.  Heart sounds: Normal heart sounds.     Comments: Chest pain not reproducible.  No rashes.  Radial pulses 2+ bilaterally Pulmonary:     Effort: Pulmonary effort is normal. No respiratory distress.     Breath sounds: Normal breath sounds.  Musculoskeletal:     Cervical back: Normal range of motion and neck supple.     Right lower leg: No edema.     Left lower leg: No edema.  Skin:    General: Skin is warm and dry.  Neurological:     Mental Status: He is alert. Mental status is at baseline.  Psychiatric:        Mood and Affect: Mood normal.        Behavior: Behavior normal.     (all labs ordered are listed, but only abnormal results are displayed) Labs Reviewed  BASIC METABOLIC PANEL WITH GFR - Abnormal; Notable for the following components:      Result Value   CO2 16 (*)    Glucose, Bld 66 (*)    Calcium  8.4 (*)     All other components within normal limits  CBC - Abnormal; Notable for the following components:   Hemoglobin 12.4 (*)    HCT 38.4 (*)    All other components within normal limits  I-STAT CHEM 8, ED - Abnormal; Notable for the following components:   BUN 27 (*)    Glucose, Bld 64 (*)    Calcium , Ion 1.13 (*)    TCO2 18 (*)    Hemoglobin 12.6 (*)    HCT 37.0 (*)    All other components within normal limits  TROPONIN I (HIGH SENSITIVITY)  TROPONIN I (HIGH SENSITIVITY)    EKG: EKG Interpretation Date/Time:  Friday August 06 2024 11:52:50 EDT Ventricular Rate:  60 PR Interval:  169 QRS Duration:  96 QT Interval:  421 QTC Calculation: 421 R Axis:   -4  Text Interpretation: Sinus rhythm Anteroseptal infarct, old Repol abnrm suggests ischemia, diffuse leads Confirmed by Yolande Charleston [phone]) on 08/06/2024 12:41:11 PM  Radiology: DG Chest Portable 1 View Result Date: 08/06/2024 CLINICAL DATA:  Chest pain, history of COVID-19 2 weeks ago EXAM: PORTABLE CHEST 1 VIEW COMPARISON:  Chest CT October 22, 2023 FINDINGS: The heart size and mediastinal contours are within normal limits. Both lungs are clear. No suspicious nodule or consolidation. The visualized skeletal structures are unremarkable. IMPRESSION: No active cardiopulmonary disease. Electronically Signed   By: Megan  Zare M.D.   On: 08/06/2024 12:20     Procedures   Medications Ordered in the ED  scopolamine  (TRANSDERM-SCOP) 1 MG/3DAYS 1 mg (1 mg Transdermal Patch Applied 08/06/24 1547)  sodium chloride  0.9 % bolus 1,000 mL (0 mLs Intravenous Stopped 08/06/24 1650)  oxyCODONE -acetaminophen  (PERCOCET/ROXICET) 5-325 MG per tablet 1 tablet (1 tablet Oral Given 08/06/24 1352)    Clinical Course as of 08/06/24 1653  Fri Aug 06, 2024  1458 Dw Dr Mona from cardiology.  If admitted they will see the patient in consultation [RP]    Clinical Course User Index [RP] Yolande Charleston BROCKS, MD                                 Medical  Decision Making Amount and/or Complexity of Data Reviewed Labs: ordered. Radiology: ordered.  Risk Prescription drug management.   Donald Roberts is a 73 year old male with history of prior tobacco use and nonobstructive CAD and prior tobacco  use who presents to the emergency department chest discomfort.   Initial Ddx:  MI, PE, pneumonia, dissection, pericarditis, costochondritis, reflux  MDM:  Patient presents emergency department with substernal chest discomfort.  Does have some concerning features associated with it.  Is also getting over COVID now.  With the patient's chest discomfort will obtain EKG and troponins to evaluate for MI.  Currently is asymptomatic.  Considered pulmonary embolism but would be very atypical for his symptoms to completely resolved if it was from PE.  He is also satting well on room air and is not tachycardic so feel this is less likely.  Considered dissection but with their symmetric pulses, history, and description of the pain feel it is less likely.  If chest x-ray reveals widened mediastinum or any other concerning findings will consider CTA.  Also considered pericarditis but description is unlikely and they do not have risk factors for this diagnosis.  Chest pain not reproducible so feel it costochondritis less likely.  No infectious symptoms to suggest pneumonia at this time that would be causing pleuritic chest pain.  Plan:  Labs Troponin EKG Chest x-ray  ED Summary/Re-evaluation:  EKG did show some ST depressions.  Initial troponin was 9 and repeat was 14.  Chest x-ray without acute findings.  Given the concerning nature of his history and risk factors did discuss with cardiology who felt that given his troponin trend and the fact that he is asymptomatic could follow-up as outpatient.  Did offer admission to the patient which she declined.  Will follow-up with cardiology in several days for continued evaluation.  Of note, was also complaining of some  vertigo that is chronic for him.  Given a scopolamine  patch for this  This patient presents to the ED for concern of complaints listed in HPI, this involves an extensive number of treatment options, and is a complaint that carries with it a high risk of complications and morbidity. Disposition including potential need for admission considered.   Dispo: DC Home. Return precautions discussed including, but not limited to, those listed in the AVS. Allowed pt time to ask questions which were answered fully prior to dc.  Additional history obtained from spouse Records reviewed Outpatient Clinic Notes The following labs were independently interpreted: Chemistry and show non-anion gap metabolic acidosis I independently reviewed the following imaging with scope of interpretation limited to determining acute life threatening conditions related to emergency care: Chest x-ray and agree with the radiologist interpretation with the following exceptions: none I personally reviewed and interpreted cardiac monitoring: normal sinus rhythm  I personally reviewed and interpreted the pt's EKG: see above for interpretation  I have reviewed the patients home medications and made adjustments as needed Consults: Cardiology Social Determinants of health:  Geriatric   Final diagnoses:  Chest pain, unspecified type    ED Discharge Orders     None          Yolande Lamar BROCKS, MD 08/06/24 [phone]

## 2024-08-11 ENCOUNTER — Telehealth: Payer: Self-pay | Admitting: Cardiology

## 2024-08-11 NOTE — Telephone Encounter (Signed)
 Spoke with the patient who was in the ER recently with chest pain. Dr. Mona saw him and they discussed a possible heart catheterization as outpatient. Patient states that he would like for Dr. Mona to do the heart catheterization. Advised patient that he will need to come in for a visit to discuss this further and determined whether or not a cath is actually needed. Patient has been scheduled for a follow up appointment on 9/15.

## 2024-08-11 NOTE — Telephone Encounter (Signed)
 Pt called in stating he went to the ED and was told he needs a cath and he wants Dr. Mona to do it. Please advise.

## 2024-08-16 ENCOUNTER — Ambulatory Visit: Attending: Physician Assistant | Admitting: Physician Assistant

## 2024-08-16 ENCOUNTER — Encounter: Payer: Self-pay | Admitting: Physician Assistant

## 2024-08-16 ENCOUNTER — Other Ambulatory Visit: Payer: Self-pay | Admitting: Physician Assistant

## 2024-08-16 VITALS — BP 88/60 | HR 74 | Ht 70.0 in | Wt 164.6 lb

## 2024-08-16 DIAGNOSIS — R0609 Other forms of dyspnea: Secondary | ICD-10-CM

## 2024-08-16 DIAGNOSIS — E7800 Pure hypercholesterolemia, unspecified: Secondary | ICD-10-CM

## 2024-08-16 DIAGNOSIS — I10 Essential (primary) hypertension: Secondary | ICD-10-CM

## 2024-08-16 DIAGNOSIS — I2510 Atherosclerotic heart disease of native coronary artery without angina pectoris: Secondary | ICD-10-CM

## 2024-08-16 DIAGNOSIS — R5383 Other fatigue: Secondary | ICD-10-CM | POA: Diagnosis not present

## 2024-08-16 DIAGNOSIS — I77810 Thoracic aortic ectasia: Secondary | ICD-10-CM

## 2024-08-16 DIAGNOSIS — R079 Chest pain, unspecified: Secondary | ICD-10-CM

## 2024-08-16 DIAGNOSIS — I739 Peripheral vascular disease, unspecified: Secondary | ICD-10-CM

## 2024-08-16 NOTE — Progress Notes (Signed)
 Cardiology Office Note   Date:  08/16/2024  ID:  Dominiq, Fontaine 06-15-1951, MRN [id number] PCP: [place], Betty G, MD  [facility] Providers Cardiologist:  Redell Shallow, MD   History of Present Illness Cheyenne Schumm is a 73 y.o. male with a past medical history of coronary artery disease, hypertension, hyperlipidemia, and history of tobacco abuse here for follow-up appointment.  Patient's history includes a cardiac catheterization December 2019 showing 25% proximal LAD disease.  Abdominal ultrasound February 2021 showed greater than 50% stenosis in the left common iliac and external iliac arteries but no abdominal aortic aneurysm.  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  Coronary CTA 4/23 showed calcium  score of 1111 (86 percentile), severe stenosis in the small nondominant RCA and mild disease everywhere else.  Echocardiogram 7/23 showed normal LV function, mild LVH.  Cardiac cath 01/15/2023 showed LVEF 55 to 65%, 45% stenosis of the LAD, 55% stenosis of the RCA, normal LVEDP.  When the patient was last seen in the office on 07/22/2023 he was doing well without any dyspnea, chest pain, palpitations or syncope.  Does have a complaint of fatigue.  Today, he  presents with coronary artery disease with chest pressure. He was referred by Dr. Mona for scheduling a cardiac catheterization.  He experiences intermittent chest pressure, first noted a week ago and again yesterday. His family history is significant for sudden cardiac events, with his brother dying suddenly at 59 after similar symptoms.  Previous cardiac catheterization showed a 55% blockage in the right coronary artery and 45% and 25% blockages in the LAD. No stents have been placed. Recent EKG shows ST depressions, and troponin levels have increased from 9 to 14 in the ER.  Admission was suggested but patient declined at that time.  Current medications include Zetia  10 mg daily, nitroglycerin  as  needed, Lyrica , Crestor , and alternating hydrocodone  or oxycodone . He denies lightheadedness or dizziness.  Blood pressure is low today 88/60.  Was low also back in May.  On chronic pain medicine.  Asymptomatic.  Reports no shortness of breath nor dyspnea on exertion.  No edema, orthopnea, PND. Reports no palpitations.   Discussed the use of AI scribe software for clinical note transcription with the patient, who gave verbal consent to proceed.   ROS: pertinent ROS in HPI  Studies Reviewed     Cardiac cath 01/2023 Left Main  Vessel is small. Short length Vessel is angiographically normal. The vessel is mildly calcified.    Left Anterior Descending  Vessel is moderate in size. Vessel is small to moderate in size, tapers to a small caliber vessel after the major diagonal branch in the mid vessel. There is moderate focal disease in the vessel.  Ost LAD to Prox LAD lesion is 25% stenosed. Vessel is not the culprit lesion. The lesion is type A and concentric. The lesion is mildly calcified. The lesion was not previously treated .  Mid LAD lesion is 45% stenosed.    First Diagonal Branch  Vessel is small in size.    Second Diagonal Branch  Vessel is moderate in size. Vessel is angiographically normal.    Ramus Intermedius  Vessel is small. Vessel is angiographically normal.    Left Circumflex  Vessel is large. Vessel is angiographically normal.    First Obtuse Marginal Branch  Vessel is small in size.    Second Obtuse Marginal Branch  Vessel is moderate in size Vessel is angiographically normal.    Left Posterior  Descending Artery  Vessel is angiographically normal.    First Left Posterolateral Branch  Vessel is moderate in size. Vessel is angiographically normal.    Second Left Posterolateral Branch  Vessel is small in size.    Right Coronary Artery  Vessel was injected. Vessel is small. There is moderate focal disease in the vessel. High anterior takeoff Superior take off   Mid RCA to Dist RCA lesion is 55% stenosed.    Acute Marginal Branch  Vessel is small in size.    Intervention   No interventions have been documented.   Wall Motion              Left Heart  Left Ventricle The left ventricular size is normal. The left ventricular systolic function is normal. LV end diastolic pressure is normal. The left ventricular ejection fraction is 55-65% by visual estimate. No regional wall motion abnormalities.  Aortic Valve There is no aortic valve stenosis. No regurgitation seen on LV gram and no gradient to suggest error stenosis, however there does appear to be hyperdynamic potential jet in the acing aorta.   Coronary Diagrams  Diagnostic Dominance: Left  Intervention    Physical Exam VS:  BP (!) 88/60   Pulse 74   Ht 5' 10 (1.778 m)   Wt 164 lb 9.6 oz (74.7 kg)   SpO2 96%   BMI 23.62 kg/m        Wt Readings from Last 3 Encounters:  08/16/24 164 lb 9.6 oz (74.7 kg)  08/06/24 155 lb (70.3 kg)  04/07/24 163 lb 6.4 oz (74.1 kg)    GEN: Well nourished, well developed in no acute distress NECK: No JVD; No carotid bruits CARDIAC: RRR, no murmurs, rubs, gallops RESPIRATORY:  Clear to auscultation without rales, wheezing or rhonchi  ABDOMEN: Soft, non-tender, non-distended EXTREMITIES:  No edema; No deformity   ASSESSMENT AND PLAN  Chest pain with abnormal EKG and elevated troponin, evaluation for coronary artery disease Intermittent chest pain with recent episodes. Family history of early cardiac death. Previous catheterization showed stenosis in RCA and LAD. Recent EKG with ST depressions and elevated troponin suggest possible ischemia. No acute myocardial infarction. Proceeding with cardiac catheterization for further evaluation. Risks explained and understood. - Schedule cardiac catheterization with Dr. Anner for August 18, 2024. - Monitor kidney function due to potential transient elevation from dye used in  catheterization.   Atherosclerotic heart disease of native coronary artery Atherosclerotic heart disease with significant stenosis in RCA and LAD. No previous stents. Current management includes Zetia , Crestor , and nitroglycerin . Recent symptoms and EKG changes necessitate further evaluation. - Continue current medications: Zetia , Crestor , and nitroglycerin  as needed. - Evaluate coronary artery disease progression through scheduled cardiac catheterization.   HTN -Hypotensive today and has been in the last several months -Currently not on any blood pressure lowering medications -Continue to maintain hydration and let us  know if lightheadedness or dizziness develops  HLD -43 LDL -continue annual labs and current medication regimen.  The patient understands that risks include but are not limited to stroke (1 in 1000), death (1 in 1000), kidney failure [usually temporary] (1 in 500), bleeding (1 in 200), allergic reaction [possibly serious] (1 in 200), and agrees to proceed.      Dispo: Follow-up 2 weeks after cardiac catheterization  Signed, Orren LOISE Fabry, PA-C

## 2024-08-16 NOTE — H&P (View-Only) (Signed)
 Cardiology Office Note   Date:  08/16/2024  ID:  Dominiq, Fontaine 06-15-1951, MRN [id number] PCP: [place], Betty G, MD  [facility] Providers Cardiologist:  Redell Shallow, MD   History of Present Illness Cheyenne Schumm is a 73 y.o. male with a past medical history of coronary artery disease, hypertension, hyperlipidemia, and history of tobacco abuse here for follow-up appointment.  Patient's history includes a cardiac catheterization December 2019 showing 25% proximal LAD disease.  Abdominal ultrasound February 2021 showed greater than 50% stenosis in the left common iliac and external iliac arteries but no abdominal aortic aneurysm.  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  Coronary CTA 4/23 showed calcium  score of 1111 (86 percentile), severe stenosis in the small nondominant RCA and mild disease everywhere else.  Echocardiogram 7/23 showed normal LV function, mild LVH.  Cardiac cath 01/15/2023 showed LVEF 55 to 65%, 45% stenosis of the LAD, 55% stenosis of the RCA, normal LVEDP.  When the patient was last seen in the office on 07/22/2023 he was doing well without any dyspnea, chest pain, palpitations or syncope.  Does have a complaint of fatigue.  Today, he  presents with coronary artery disease with chest pressure. He was referred by Dr. Mona for scheduling a cardiac catheterization.  He experiences intermittent chest pressure, first noted a week ago and again yesterday. His family history is significant for sudden cardiac events, with his brother dying suddenly at 59 after similar symptoms.  Previous cardiac catheterization showed a 55% blockage in the right coronary artery and 45% and 25% blockages in the LAD. No stents have been placed. Recent EKG shows ST depressions, and troponin levels have increased from 9 to 14 in the ER.  Admission was suggested but patient declined at that time.  Current medications include Zetia  10 mg daily, nitroglycerin  as  needed, Lyrica , Crestor , and alternating hydrocodone  or oxycodone . He denies lightheadedness or dizziness.  Blood pressure is low today 88/60.  Was low also back in May.  On chronic pain medicine.  Asymptomatic.  Reports no shortness of breath nor dyspnea on exertion.  No edema, orthopnea, PND. Reports no palpitations.   Discussed the use of AI scribe software for clinical note transcription with the patient, who gave verbal consent to proceed.   ROS: pertinent ROS in HPI  Studies Reviewed     Cardiac cath 01/2023 Left Main  Vessel is small. Short length Vessel is angiographically normal. The vessel is mildly calcified.    Left Anterior Descending  Vessel is moderate in size. Vessel is small to moderate in size, tapers to a small caliber vessel after the major diagonal branch in the mid vessel. There is moderate focal disease in the vessel.  Ost LAD to Prox LAD lesion is 25% stenosed. Vessel is not the culprit lesion. The lesion is type A and concentric. The lesion is mildly calcified. The lesion was not previously treated .  Mid LAD lesion is 45% stenosed.    First Diagonal Branch  Vessel is small in size.    Second Diagonal Branch  Vessel is moderate in size. Vessel is angiographically normal.    Ramus Intermedius  Vessel is small. Vessel is angiographically normal.    Left Circumflex  Vessel is large. Vessel is angiographically normal.    First Obtuse Marginal Branch  Vessel is small in size.    Second Obtuse Marginal Branch  Vessel is moderate in size Vessel is angiographically normal.    Left Posterior  Descending Artery  Vessel is angiographically normal.    First Left Posterolateral Branch  Vessel is moderate in size. Vessel is angiographically normal.    Second Left Posterolateral Branch  Vessel is small in size.    Right Coronary Artery  Vessel was injected. Vessel is small. There is moderate focal disease in the vessel. High anterior takeoff Superior take off   Mid RCA to Dist RCA lesion is 55% stenosed.    Acute Marginal Branch  Vessel is small in size.    Intervention   No interventions have been documented.   Wall Motion              Left Heart  Left Ventricle The left ventricular size is normal. The left ventricular systolic function is normal. LV end diastolic pressure is normal. The left ventricular ejection fraction is 55-65% by visual estimate. No regional wall motion abnormalities.  Aortic Valve There is no aortic valve stenosis. No regurgitation seen on LV gram and no gradient to suggest error stenosis, however there does appear to be hyperdynamic potential jet in the acing aorta.   Coronary Diagrams  Diagnostic Dominance: Left  Intervention    Physical Exam VS:  BP (!) 88/60   Pulse 74   Ht 5' 10 (1.778 m)   Wt 164 lb 9.6 oz (74.7 kg)   SpO2 96%   BMI 23.62 kg/m        Wt Readings from Last 3 Encounters:  08/16/24 164 lb 9.6 oz (74.7 kg)  08/06/24 155 lb (70.3 kg)  04/07/24 163 lb 6.4 oz (74.1 kg)    GEN: Well nourished, well developed in no acute distress NECK: No JVD; No carotid bruits CARDIAC: RRR, no murmurs, rubs, gallops RESPIRATORY:  Clear to auscultation without rales, wheezing or rhonchi  ABDOMEN: Soft, non-tender, non-distended EXTREMITIES:  No edema; No deformity   ASSESSMENT AND PLAN  Chest pain with abnormal EKG and elevated troponin, evaluation for coronary artery disease Intermittent chest pain with recent episodes. Family history of early cardiac death. Previous catheterization showed stenosis in RCA and LAD. Recent EKG with ST depressions and elevated troponin suggest possible ischemia. No acute myocardial infarction. Proceeding with cardiac catheterization for further evaluation. Risks explained and understood. - Schedule cardiac catheterization with Dr. Anner for August 18, 2024. - Monitor kidney function due to potential transient elevation from dye used in  catheterization.   Atherosclerotic heart disease of native coronary artery Atherosclerotic heart disease with significant stenosis in RCA and LAD. No previous stents. Current management includes Zetia , Crestor , and nitroglycerin . Recent symptoms and EKG changes necessitate further evaluation. - Continue current medications: Zetia , Crestor , and nitroglycerin  as needed. - Evaluate coronary artery disease progression through scheduled cardiac catheterization.   HTN -Hypotensive today and has been in the last several months -Currently not on any blood pressure lowering medications -Continue to maintain hydration and let us  know if lightheadedness or dizziness develops  HLD -43 LDL -continue annual labs and current medication regimen.  The patient understands that risks include but are not limited to stroke (1 in 1000), death (1 in 1000), kidney failure [usually temporary] (1 in 500), bleeding (1 in 200), allergic reaction [possibly serious] (1 in 200), and agrees to proceed.      Dispo: Follow-up 2 weeks after cardiac catheterization  Signed, Orren LOISE Fabry, PA-C

## 2024-08-16 NOTE — Patient Instructions (Addendum)
 Medication Instructions:  Your physician recommends that you continue on your current medications as directed. Please refer to the Current Medication list given to you today.  *If you need a refill on your cardiac medications before your next appointment, please call your pharmacy*  Lab Work:  BMET, CBC  today If you have labs (blood work) drawn today and your tests are completely normal, you will receive your results only by: MyChart Message (if you have MyChart) OR A paper copy in the mail If you have any lab test that is abnormal or we need to change your treatment, we will call you to review the results.  Testing/Procedures: Your physician has requested that you have a cardiac catheterization. Cardiac catheterization is used to diagnose and/or treat various heart conditions. Doctors may recommend this procedure for a number of different reasons. The most common reason is to evaluate chest pain. Chest pain can be a symptom of coronary artery disease (CAD), and cardiac catheterization can show whether plaque is narrowing or blocking your heart's arteries. This procedure is also used to evaluate the valves, as well as measure the blood flow and oxygen levels in different parts of your heart. For further information please visit [email or web address] Please follow instruction sheet, as given.   Follow-Up: At [facility], you and your health needs are our priority.  As part of our continuing mission to provide you with exceptional heart care, our providers are all part of one team.  This team includes your primary Cardiologist (physician) and Advanced Practice Providers or APPs (Physician Assistants and Nurse Practitioners) who all work together to provide you with the care you need, when you need it.  Your next appointment:   2 week(s)    Thursday  10/9  8:25 am  Provider:   One of our Advanced Practice Providers (APPs): Morse Clause, PA-C  Lamarr Satterfield, NP Miriam Shams,  NP  Olivia Pavy, PA-C Josefa Beauvais, NP  Leontine Salen, PA-C Orren Fabry, PA-C  Hao Meng, PA-C Ernest Dick, NP  Damien Braver, NP Jon Hails, PA-C  Waddell Donath, PA-C    Dayna Dunn, PA-C  Scott Weaver, PA-C Lum Louis, NP Katlyn West, NP Callie Goodrich, PA-C  Xika Zhao, NP Sheng Haley, PA-C    Kathleen Johnson, PA-C   We recommend signing up for the patient portal called MyChart.  Sign up information is provided on this After Visit Summary.  MyChart is used to connect with patients for Virtual Visits (Telemedicine).  Patients are able to view lab/test results, encounter notes, upcoming appointments, etc.  Non-urgent messages can be sent to your provider as well.   To learn more about what you can do with MyChart, go to [email or web address].      [place] [place] [facility] A DEPT OF [place] [address] A DEPT OF THE [place] [address] [place] [address] Dept: [phone] Loc: [phone]  Keyante Durio  08/16/2024  You are scheduled for a Cardiac Catheterization on Wednesday, September 24 with Dr. Alm Clay.  1. Please arrive at the [facility] (Main Entrance A) at [facility]: [address] [place], [place] [address] at 6:30 AM (This time is 2 hour(s) before your procedure to ensure your preparation).   Free valet parking service is available. You will check in at ADMITTING. The support person will be asked to wait in the waiting room.  It is OK to have someone drop you off and  come back when you are ready to be discharged.    Special note: Every effort is made to have your procedure done on time. Please understand that emergencies sometimes delay scheduled procedures.  2. Diet: Nothing to eat after midnight.   3. Hydration: You need to be well hydrated before your procedure. On September 24, you may drink approved liquids (see below) until 2 hours before the procedure, with 16 oz of water as  your last intake.   List of approved liquids water, clear juice, clear tea, black coffee, fruit juices, non-citric and without pulp, carbonated beverages, Gatorade, Kool -Aid, plain Jello-O and plain ice popsicles.  4. Labs: You will need to have blood drawn on Monday 9/15. You do not need to be fasting.  5. Medication instructions in preparation for your procedure:      On the morning of your procedure, take your Aspirin  81 mg and any morning medicines NOT listed above.  You may use sips of water.  6. Plan to go home the same day, you will only stay overnight if medically necessary. 7. Bring a current list of your medications and current insurance cards. 8. You MUST have a responsible person to drive you home. 9. Someone MUST be with you the first 24 hours after you arrive home or your discharge will be delayed. 10. Please wear clothes that are easy to get on and off and wear slip-on shoes.  Thank you for allowing us  to care for you!   -- [facility]

## 2024-08-16 NOTE — Progress Notes (Unsigned)
Cardiac cath orders placed.

## 2024-08-17 ENCOUNTER — Ambulatory Visit: Payer: Self-pay | Admitting: Physician Assistant

## 2024-08-17 LAB — BASIC METABOLIC PANEL WITH GFR
BUN/Creatinine Ratio: 16 (ref 10–24)
BUN: 14 mg/dL (ref 8–27)
CO2: 23 mmol/L (ref 20–29)
Calcium: 9 mg/dL (ref 8.6–10.2)
Chloride: 103 mmol/L (ref 96–106)
Creatinine, Ser: 0.86 mg/dL (ref 0.76–1.27)
Glucose: 82 mg/dL (ref 70–99)
Potassium: 4 mmol/L (ref 3.5–5.2)
Sodium: 140 mmol/L (ref 134–144)
eGFR: 92 mL/min/1.73 (ref >59)

## 2024-08-17 LAB — CBC
Hematocrit: 37.7 % (ref 37.5–51.0)
Hemoglobin: 12.2 g/dL — ABNORMAL LOW (ref 13.0–17.7)
MCH: 28.6 pg (ref 26.6–33.0)
MCHC: 32.4 g/dL (ref 31.5–35.7)
MCV: 88 fL (ref 79–97)
Platelets: 336 x10E3/uL (ref 150–450)
RBC: 4.27 x10E6/uL (ref 4.14–5.80)
RDW: 13.4 % (ref 11.6–15.4)
WBC: 9 x10E3/uL (ref 3.4–10.8)

## 2024-08-24 ENCOUNTER — Telehealth: Payer: Self-pay | Admitting: *Deleted

## 2024-08-24 NOTE — Telephone Encounter (Signed)
 Cardiac Catheterization scheduled at [facility] for: Wednesday August 25, 2024 8:30 AM Arrival time [facility] Main Entrance A at: 6:30 AM  Diet: -Nothing to eat after midnight.  Hydration: -May drink clear liquids until 2 hours before the procedure.  Approved liquids: Water , clear tea, black coffee, fruit juices-non-citric and without pulp,Gatorade, plain Jello/popsicles.   -Please drink 16 oz of water  2 hours before procedure.  Medication instructions: -Usual morning medications can be taken including aspirin  81 mg.  Plan to go home the same day, you will only stay overnight if medically necessary.  You must have responsible adult to drive you home.  Someone must be with you the first 24 hours after you arrive home.  Reviewed procedure instructions with patient.

## 2024-08-25 ENCOUNTER — Ambulatory Visit (HOSPITAL_COMMUNITY)
Admission: RE | Admit: 2024-08-25 | Discharge: 2024-08-25 | Disposition: A | Discharge status: Home / Self Care | Attending: Cardiology | Admitting: Cardiology

## 2024-08-25 ENCOUNTER — Other Ambulatory Visit: Payer: Self-pay

## 2024-08-25 ENCOUNTER — Encounter (HOSPITAL_COMMUNITY)
Admission: RE | Disposition: A | Discharge status: Home / Self Care | Payer: Self-pay | Source: Home / Self Care | Attending: Cardiology

## 2024-08-25 DIAGNOSIS — I119 Hypertensive heart disease without heart failure: Secondary | ICD-10-CM | POA: Insufficient documentation

## 2024-08-25 DIAGNOSIS — E785 Hyperlipidemia, unspecified: Secondary | ICD-10-CM | POA: Diagnosis not present

## 2024-08-25 DIAGNOSIS — Z87891 Personal history of nicotine dependence: Secondary | ICD-10-CM | POA: Insufficient documentation

## 2024-08-25 DIAGNOSIS — R5383 Other fatigue: Secondary | ICD-10-CM | POA: Insufficient documentation

## 2024-08-25 DIAGNOSIS — I25118 Atherosclerotic heart disease of native coronary artery with other forms of angina pectoris: Secondary | ICD-10-CM | POA: Insufficient documentation

## 2024-08-25 DIAGNOSIS — Z006 Encounter for examination for normal comparison and control in clinical research program: Secondary | ICD-10-CM

## 2024-08-25 HISTORY — PX: LEFT HEART CATH AND CORONARY ANGIOGRAPHY: CATH118249

## 2024-08-25 LAB — BASIC METABOLIC PANEL WITH GFR
Anion gap: 8 (ref 5–15)
BUN: 14 mg/dL (ref 8–23)
CO2: 23 mmol/L (ref 22–32)
Calcium: 8.8 mg/dL — ABNORMAL LOW (ref 8.9–10.3)
Chloride: 106 mmol/L (ref 98–111)
Creatinine, Ser: 1.01 mg/dL (ref 0.61–1.24)
GFR, Estimated: >60 mL/min (ref >60)
Glucose, Bld: 96 mg/dL (ref 70–99)
Potassium: 4.3 mmol/L (ref 3.5–5.1)
Sodium: 137 mmol/L (ref 135–145)

## 2024-08-25 SURGERY — LEFT HEART CATH AND CORONARY ANGIOGRAPHY
Anesthesia: LOCAL

## 2024-08-25 MED ORDER — VERAPAMIL HCL 2.5 MG/ML IV SOLN
INTRAVENOUS | Status: DC | PRN
  Administered 2024-08-25: 10 mL via INTRA_ARTERIAL

## 2024-08-25 MED ORDER — LIDOCAINE HCL (PF) 1 % IJ SOLN
INTRAMUSCULAR | Status: DC | PRN
  Administered 2024-08-25: 2 mL

## 2024-08-25 MED ORDER — HEPARIN SODIUM (PORCINE) 1000 UNIT/ML IJ SOLN
INTRAMUSCULAR | Status: AC
  Filled 2024-08-25: qty 10

## 2024-08-25 MED ORDER — ASPIRIN 81 MG PO CHEW
81.0000 mg | CHEWABLE_TABLET | ORAL | Status: AC
  Administered 2024-08-25: 81 mg via ORAL
  Filled 2024-08-25: qty 1

## 2024-08-25 MED ORDER — SODIUM CHLORIDE 0.9 % IV SOLN: 250.0000 mL | INTRAVENOUS | Status: DC | PRN

## 2024-08-25 MED ORDER — FREE WATER
500.0000 mL | Freq: Once | Status: AC
  Administered 2024-08-25: 500 mL via ORAL

## 2024-08-25 MED ORDER — FENTANYL CITRATE (PF) 100 MCG/2ML IJ SOLN
INTRAMUSCULAR | Status: AC
  Filled 2024-08-25: qty 2

## 2024-08-25 MED ORDER — MIDAZOLAM HCL 2 MG/2ML IJ SOLN
INTRAMUSCULAR | Status: DC | PRN
  Administered 2024-08-25: 1 mg via INTRAVENOUS

## 2024-08-25 MED ORDER — SODIUM CHLORIDE 0.9% FLUSH: 3.0000 mL | INTRAVENOUS | Status: DC | PRN

## 2024-08-25 MED ORDER — HEPARIN (PORCINE) IN NACL 1000-0.9 UT/500ML-% IV SOLN
INTRAVENOUS | Status: DC | PRN
  Administered 2024-08-25: 1000 mL via INTRA_ARTERIAL

## 2024-08-25 MED ORDER — FENTANYL CITRATE (PF) 100 MCG/2ML IJ SOLN
INTRAMUSCULAR | Status: DC | PRN
  Administered 2024-08-25: 25 ug via INTRAVENOUS

## 2024-08-25 MED ORDER — VERAPAMIL HCL 2.5 MG/ML IV SOLN
INTRAVENOUS | Status: AC
  Filled 2024-08-25: qty 2

## 2024-08-25 MED ORDER — SODIUM CHLORIDE 0.9% FLUSH: 3.0000 mL | Freq: Two times a day (BID) | INTRAVENOUS | Status: DC

## 2024-08-25 MED ORDER — MIDAZOLAM HCL 2 MG/2ML IJ SOLN
INTRAMUSCULAR | Status: AC
  Filled 2024-08-25: qty 2

## 2024-08-25 MED ORDER — LIDOCAINE HCL (PF) 1 % IJ SOLN
INTRAMUSCULAR | Status: AC
  Filled 2024-08-25: qty 30

## 2024-08-25 MED ORDER — IOHEXOL 350 MG/ML SOLN
INTRAVENOUS | Status: DC | PRN
  Administered 2024-08-25: 77 mL via INTRA_ARTERIAL

## 2024-08-25 MED ORDER — HEPARIN SODIUM (PORCINE) 1000 UNIT/ML IJ SOLN
INTRAMUSCULAR | Status: DC | PRN
  Administered 2024-08-25: 4000 [IU] via INTRAVENOUS

## 2024-08-25 SURGICAL SUPPLY — 9 items
CATH INFINITI 5FR AL1 (CATHETERS) IMPLANT
CATH INFINITI AMBI 5FR TG (CATHETERS) IMPLANT
DEVICE RAD COMP TR BAND LRG (VASCULAR PRODUCTS) IMPLANT
GLIDESHEATH SLEND SS 6F .021 (SHEATH) IMPLANT
GUIDEWIRE INQWIRE 1.5J.035X260 (WIRE) IMPLANT
KIT SYRINGE INJ CVI SPIKEX1 (MISCELLANEOUS) IMPLANT
PACK CARDIAC CATHETERIZATION (CUSTOM PROCEDURE TRAY) ×1 IMPLANT
SET ATX-X65L (MISCELLANEOUS) IMPLANT
SHEATH PROBE COVER 6X72 (BAG) IMPLANT

## 2024-08-25 NOTE — Discharge Instructions (Signed)

## 2024-08-25 NOTE — Research (Cosign Needed Addendum)
 Prevail Informed Consent   Subject Name: Donald Roberts  Subject met inclusion and exclusion criteria.  The informed consent form, study requirements and expectations were reviewed with the subject and questions and concerns were addressed prior to the signing of the consent form.  The subject verbalized understanding of the trial requirements.  The subject agreed to participate in the Prevail trial and signed the informed consent at 0645 on 08/25/2024.  The informed consent was obtained prior to performance of any protocol-specific procedures for the subject.  A copy of the signed informed consent was given to the subject and a copy was placed in the subject's medical record.   Kendrix Orman   Screen [facility]ail

## 2024-08-25 NOTE — Interval H&P Note (Signed)
 History and Physical Interval Note:  08/25/2024 8:09 AM  Donald Roberts  has presented today for surgery, with the diagnosis of unstable angina.  The various methods of treatment have been discussed with the patient and family. After consideration of risks, benefits and other options for treatment, the patient has consented to  Procedure(s): LEFT HEART CATH AND CORONARY ANGIOGRAPHY (N/A)  PERCUTANEOUS CORONARY INTERVENTION   as a surgical intervention.  The patient's history has been reviewed, patient examined, no change in status, stable for surgery.  I have reviewed the patient's chart and labs.  Questions were answered to the patient's satisfaction.    Cath Lab Visit (complete for each Cath Lab visit)  Clinical Evaluation Leading to the Procedure:   ACS: No.  Non-ACS:    Anginal Classification: CCS IV  Anti-ischemic medical therapy: Minimal Therapy (1 class of medications)  Non-Invasive Test Results: No non-invasive testing performed  Prior CABG: No previous CABG     Alm Clay

## 2024-08-25 NOTE — Progress Notes (Signed)
 TR Band removed no s/s of complications. Discharge instructions reviewed with patient and wife at bedside. Denies questions and concerns. PT was able to ambulate in the hallway void in urinal at bedside without difficulty. Escorted from the unit via wheel chair.

## 2024-08-26 ENCOUNTER — Encounter (HOSPITAL_COMMUNITY): Payer: Self-pay | Admitting: Cardiology

## 2024-09-05 ENCOUNTER — Inpatient Hospital Stay (HOSPITAL_COMMUNITY)
Admission: EM | Admit: 2024-09-05 | Discharge: 2024-09-06 | DRG: 871 | Discharge status: Against Medical Advice | Attending: Family Medicine | Admitting: Family Medicine

## 2024-09-05 ENCOUNTER — Emergency Department (HOSPITAL_COMMUNITY)

## 2024-09-05 ENCOUNTER — Encounter (HOSPITAL_COMMUNITY): Payer: Self-pay

## 2024-09-05 ENCOUNTER — Other Ambulatory Visit: Payer: Self-pay

## 2024-09-05 DIAGNOSIS — Z87891 Personal history of nicotine dependence: Secondary | ICD-10-CM

## 2024-09-05 DIAGNOSIS — D72829 Elevated white blood cell count, unspecified: Secondary | ICD-10-CM | POA: Diagnosis present

## 2024-09-05 DIAGNOSIS — M1711 Unilateral primary osteoarthritis, right knee: Secondary | ICD-10-CM | POA: Diagnosis present

## 2024-09-05 DIAGNOSIS — A4151 Sepsis due to Escherichia coli [E. coli]: Principal | ICD-10-CM | POA: Diagnosis present

## 2024-09-05 DIAGNOSIS — G9341 Metabolic encephalopathy: Secondary | ICD-10-CM | POA: Diagnosis present

## 2024-09-05 DIAGNOSIS — I10 Essential (primary) hypertension: Secondary | ICD-10-CM | POA: Diagnosis present

## 2024-09-05 DIAGNOSIS — Z859 Personal history of malignant neoplasm, unspecified: Secondary | ICD-10-CM

## 2024-09-05 DIAGNOSIS — Z981 Arthrodesis status: Secondary | ICD-10-CM

## 2024-09-05 DIAGNOSIS — E785 Hyperlipidemia, unspecified: Secondary | ICD-10-CM | POA: Diagnosis present

## 2024-09-05 DIAGNOSIS — A419 Sepsis, unspecified organism: Principal | ICD-10-CM | POA: Diagnosis present

## 2024-09-05 DIAGNOSIS — R6520 Severe sepsis without septic shock: Secondary | ICD-10-CM | POA: Diagnosis present

## 2024-09-05 DIAGNOSIS — Z87440 Personal history of urinary (tract) infections: Secondary | ICD-10-CM

## 2024-09-05 DIAGNOSIS — Z833 Family history of diabetes mellitus: Secondary | ICD-10-CM

## 2024-09-05 DIAGNOSIS — Z79891 Long term (current) use of opiate analgesic: Secondary | ICD-10-CM

## 2024-09-05 DIAGNOSIS — Z5329 Procedure and treatment not carried out because of patient's decision for other reasons: Secondary | ICD-10-CM | POA: Diagnosis present

## 2024-09-05 DIAGNOSIS — R4182 Altered mental status, unspecified: Secondary | ICD-10-CM | POA: Diagnosis not present

## 2024-09-05 DIAGNOSIS — U071 COVID-19: Secondary | ICD-10-CM | POA: Diagnosis present

## 2024-09-05 DIAGNOSIS — G8929 Other chronic pain: Secondary | ICD-10-CM | POA: Diagnosis present

## 2024-09-05 DIAGNOSIS — Z860100 Personal history of colon polyps, unspecified: Secondary | ICD-10-CM

## 2024-09-05 DIAGNOSIS — N5089 Other specified disorders of the male genital organs: Secondary | ICD-10-CM | POA: Diagnosis present

## 2024-09-05 DIAGNOSIS — R509 Fever, unspecified: Secondary | ICD-10-CM | POA: Diagnosis present

## 2024-09-05 DIAGNOSIS — J9601 Acute respiratory failure with hypoxia: Secondary | ICD-10-CM | POA: Diagnosis present

## 2024-09-05 DIAGNOSIS — Z79899 Other long term (current) drug therapy: Secondary | ICD-10-CM

## 2024-09-05 DIAGNOSIS — Z8249 Family history of ischemic heart disease and other diseases of the circulatory system: Secondary | ICD-10-CM

## 2024-09-05 DIAGNOSIS — Z7982 Long term (current) use of aspirin: Secondary | ICD-10-CM

## 2024-09-05 DIAGNOSIS — Z8616 Personal history of COVID-19: Secondary | ICD-10-CM

## 2024-09-05 DIAGNOSIS — J189 Pneumonia, unspecified organism: Secondary | ICD-10-CM | POA: Diagnosis present

## 2024-09-05 DIAGNOSIS — N390 Urinary tract infection, site not specified: Secondary | ICD-10-CM

## 2024-09-05 DIAGNOSIS — N401 Enlarged prostate with lower urinary tract symptoms: Secondary | ICD-10-CM | POA: Diagnosis present

## 2024-09-05 DIAGNOSIS — I700 Atherosclerosis of aorta: Secondary | ICD-10-CM | POA: Diagnosis present

## 2024-09-05 DIAGNOSIS — G603 Idiopathic progressive neuropathy: Secondary | ICD-10-CM | POA: Diagnosis present

## 2024-09-05 DIAGNOSIS — I739 Peripheral vascular disease, unspecified: Secondary | ICD-10-CM | POA: Diagnosis present

## 2024-09-05 DIAGNOSIS — N3000 Acute cystitis without hematuria: Secondary | ICD-10-CM | POA: Diagnosis present

## 2024-09-05 DIAGNOSIS — I2510 Atherosclerotic heart disease of native coronary artery without angina pectoris: Secondary | ICD-10-CM | POA: Diagnosis present

## 2024-09-05 DIAGNOSIS — Z809 Family history of malignant neoplasm, unspecified: Secondary | ICD-10-CM

## 2024-09-05 DIAGNOSIS — G894 Chronic pain syndrome: Secondary | ICD-10-CM | POA: Diagnosis present

## 2024-09-05 LAB — I-STAT CHEM 8, ED
BUN: 17 mg/dL (ref 8–23)
Calcium, Ion: 1.14 mmol/L — ABNORMAL LOW (ref 1.15–1.40)
Chloride: 100 mmol/L (ref 98–111)
Creatinine, Ser: 1 mg/dL (ref 0.61–1.24)
Glucose, Bld: 90 mg/dL (ref 70–99)
HCT: 34 % — ABNORMAL LOW (ref 39.0–52.0)
Hemoglobin: 11.6 g/dL — ABNORMAL LOW (ref 13.0–17.0)
Potassium: 4.1 mmol/L (ref 3.5–5.1)
Sodium: 135 mmol/L (ref 135–145)
TCO2: 23 mmol/L (ref 22–32)

## 2024-09-05 LAB — CBC WITH DIFFERENTIAL/PLATELET
Abs Immature Granulocytes: 0.15 K/uL — ABNORMAL HIGH (ref 0.00–0.07)
Basophils Absolute: 0 K/uL (ref 0.0–0.1)
Basophils Relative: 0 %
Eosinophils Absolute: 0 K/uL (ref 0.0–0.5)
Eosinophils Relative: 0 %
HCT: 34.3 % — ABNORMAL LOW (ref 39.0–52.0)
Hemoglobin: 11.1 g/dL — ABNORMAL LOW (ref 13.0–17.0)
Immature Granulocytes: 1 %
Lymphocytes Relative: 5 %
Lymphs Abs: 0.8 K/uL (ref 0.7–4.0)
MCH: 28.8 pg (ref 26.0–34.0)
MCHC: 32.4 g/dL (ref 30.0–36.0)
MCV: 88.9 fL (ref 80.0–100.0)
Monocytes Absolute: 1.4 K/uL — ABNORMAL HIGH (ref 0.1–1.0)
Monocytes Relative: 9 %
Neutro Abs: 14.3 K/uL — ABNORMAL HIGH (ref 1.7–7.7)
Neutrophils Relative %: 85 %
Platelets: 247 K/uL (ref 150–400)
RBC: 3.86 MIL/uL — ABNORMAL LOW (ref 4.22–5.81)
RDW: 14.5 % (ref 11.5–15.5)
WBC: 16.6 K/uL — ABNORMAL HIGH (ref 4.0–10.5)
nRBC: 0 % (ref 0.0–0.2)

## 2024-09-05 LAB — I-STAT CG4 LACTIC ACID, ED: Lactic Acid, Venous: 0.8 mmol/L (ref 0.5–1.9)

## 2024-09-05 MED ORDER — SODIUM CHLORIDE 0.9 % IV BOLUS (SEPSIS)
1000.0000 mL | Freq: Once | INTRAVENOUS | Status: AC
  Administered 2024-09-05: 1000 mL via INTRAVENOUS

## 2024-09-05 MED ORDER — ACETAMINOPHEN 500 MG PO TABS
1000.0000 mg | ORAL_TABLET | Freq: Once | ORAL | Status: DC
  Filled 2024-09-05: qty 2
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
## 2024-09-05 NOTE — ED Provider Notes (Incomplete)
 [place] [facility] AT [facility] Provider Note  CSN: [id number] Arrival date & time: 09/05/24 2326  Chief Complaint(s) No chief complaint on file.  HPI Donald Roberts is a 73 y.o. male {Add pertinent medical, surgical, social history, OB history to HPI:1}   HPI  Past Medical History Past Medical History:  Diagnosis Date  . Abnormal gait   . Allergic rhinitis, cause unspecified 07/18/2008  . Back pain, chronic 10/01/2011  . CAD (coronary artery disease)   . Carpal tunnel syndrome on left   . Chronic low back pain 12/25/2012  . Degenerative arthritis of right knee 10/01/2011  . Diverticulosis of colon (without mention of hemorrhage) 07/18/2008  . Gross hematuria 07/18/2008  . Headache(784.0)    Hx: of Migraines as a child  . Hyperlipidemia 07/18/2008  . Hypertension   . Idiopathic progressive neuropathy   . Personal history of colonic polyps 07/18/2008  . PVD (peripheral vascular disease)   . Tobacco use disorder 04/06/2009   Patient Active Problem List   Diagnosis Date Noted  . Chest pain 08/06/2024  . Urinary frequency 07/11/2023  . Abnormal cardiac CT angiography 01/15/2023  . Pre-op exam 04/16/2022  . Hypotension 04/16/2022  . Cervical myelopathy (HCC) 10/03/2020  . Aortic atherosclerosis 08/13/2019  . Fatigue 06/15/2019  . Chronic insomnia 11/23/2018  . Cervicalgia 05/11/2018  . Spondylosis without myelopathy or radiculopathy, cervical region 04/22/2018  . Cervical facet syndrome (Left) 04/22/2018  . Neurogenic pain 04/22/2018  . Cervical spondylosis with radiculopathy 04/21/2018  . Osteoarthritis of knee (Right) 04/21/2018  . History of cervical spinal surgery 04/21/2018  . 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 Long term prescription benzodiazepine use 04/21/2018  . Stopped smoking with greater than 40 pack year history 04/17/2018  . Chronic neck pain (Primary Area of Pain) (Bilateral) (L>R) 04/01/2018  . Chronic shoulder pain  (Secondary Area of Pain) (Left) 04/01/2018  . Chronic low back pain [facility] Area of Pain) (Bilateral) 04/01/2018  . Chronic pain syndrome 04/01/2018  . Long term current use of opiate analgesic 04/01/2018  . Pharmacologic therapy 04/01/2018  . Disorder of skeletal system 04/01/2018  . Problems influencing health status 04/01/2018  . Chronic cervical radiculopathy 03/17/2018  . Personal history of tobacco use, presenting hazards to health 02/03/2017  . Hyperlipidemia   . CAD (coronary artery disease)   . History of colonic polyps    Home Medication(s) Prior to Admission medications   Medication Sig Start Date End Date Taking? Authorizing Provider  nitroGLYCERIN  (NITROSTAT ) 0.4 MG SL tablet DISSOLVE 1 TABLET UNDER THE TONGUE EVERY 5 MINUTES FOR UP TO 3 DOSES AS NEEDED FOR CHEST PAIN. IF NO RELIEF AFTER 3 DOSES, CALL 911 OR GO TO ER. 05/18/24   Pietro Redell RAMAN, MD  oxyCODONE -acetaminophen  (PERCOCET) 10-325 MG tablet Take 1 tablet by mouth every 4 (four) hours as needed for pain. 07/30/24   [provider]  pregabalin  (LYRICA ) 200 MG capsule Take 200 mg by mouth at bedtime. 08/15/22   [provider]  Probiotic Product (PROBIOTIC DAILY PO) Take 1 tablet by mouth daily.    [provider]  rosuvastatin  (CRESTOR ) 40 MG tablet Take 1 tablet (40 mg total) by mouth daily. Patient taking differently: Take 40 mg by mouth once a week. 05/18/24   Pietro Redell RAMAN, MD  Allergies Patient has no known allergies.  Review of Systems Review of Systems As noted in HPI  Physical Exam Vital Signs  I have reviewed the triage vital signs There were no vitals taken for this visit. *** Physical Exam  ED Results and Treatments Labs (all labs ordered are listed, but only abnormal results are displayed) Labs Reviewed - No data to display                                                                                                                        EKG  EKG Interpretation Date/Time:    Ventricular Rate:    PR Interval:    QRS Duration:    QT Interval:    QTC Calculation:   R Axis:      Text Interpretation:         Radiology No results found.  Medications Ordered in ED Medications - No data to display Procedures Procedures  (including critical care time) Medical Decision Making / ED Course   Medical Decision Making   ***    Final Clinical Impression(s) / ED Diagnoses Final diagnoses:  None    This chart was dictated using voice recognition software.  Despite best efforts to proofread,  errors can occur which can change the documentation meaning.

## 2024-09-06 ENCOUNTER — Emergency Department (HOSPITAL_COMMUNITY)

## 2024-09-06 ENCOUNTER — Inpatient Hospital Stay (HOSPITAL_COMMUNITY)

## 2024-09-06 ENCOUNTER — Encounter (HOSPITAL_COMMUNITY): Payer: Self-pay | Admitting: Internal Medicine

## 2024-09-06 DIAGNOSIS — M545 Low back pain, unspecified: Secondary | ICD-10-CM | POA: Diagnosis not present

## 2024-09-06 DIAGNOSIS — G894 Chronic pain syndrome: Secondary | ICD-10-CM | POA: Diagnosis present

## 2024-09-06 DIAGNOSIS — N3001 Acute cystitis with hematuria: Secondary | ICD-10-CM

## 2024-09-06 DIAGNOSIS — I10 Essential (primary) hypertension: Secondary | ICD-10-CM | POA: Diagnosis present

## 2024-09-06 DIAGNOSIS — N3000 Acute cystitis without hematuria: Secondary | ICD-10-CM | POA: Diagnosis present

## 2024-09-06 DIAGNOSIS — U071 COVID-19: Secondary | ICD-10-CM | POA: Diagnosis present

## 2024-09-06 DIAGNOSIS — A419 Sepsis, unspecified organism: Secondary | ICD-10-CM | POA: Diagnosis present

## 2024-09-06 DIAGNOSIS — R6520 Severe sepsis without septic shock: Secondary | ICD-10-CM | POA: Diagnosis present

## 2024-09-06 DIAGNOSIS — N5089 Other specified disorders of the male genital organs: Secondary | ICD-10-CM | POA: Diagnosis present

## 2024-09-06 DIAGNOSIS — J9601 Acute respiratory failure with hypoxia: Secondary | ICD-10-CM | POA: Diagnosis present

## 2024-09-06 DIAGNOSIS — G8929 Other chronic pain: Secondary | ICD-10-CM

## 2024-09-06 DIAGNOSIS — G603 Idiopathic progressive neuropathy: Secondary | ICD-10-CM | POA: Diagnosis present

## 2024-09-06 DIAGNOSIS — D72829 Elevated white blood cell count, unspecified: Secondary | ICD-10-CM | POA: Diagnosis present

## 2024-09-06 DIAGNOSIS — I739 Peripheral vascular disease, unspecified: Secondary | ICD-10-CM | POA: Diagnosis present

## 2024-09-06 DIAGNOSIS — Z860100 Personal history of colon polyps, unspecified: Secondary | ICD-10-CM | POA: Diagnosis not present

## 2024-09-06 DIAGNOSIS — R509 Fever, unspecified: Secondary | ICD-10-CM | POA: Diagnosis present

## 2024-09-06 DIAGNOSIS — Z8616 Personal history of COVID-19: Secondary | ICD-10-CM | POA: Diagnosis not present

## 2024-09-06 DIAGNOSIS — E782 Mixed hyperlipidemia: Secondary | ICD-10-CM

## 2024-09-06 DIAGNOSIS — A4151 Sepsis due to Escherichia coli [E. coli]: Secondary | ICD-10-CM | POA: Diagnosis present

## 2024-09-06 DIAGNOSIS — J189 Pneumonia, unspecified organism: Secondary | ICD-10-CM | POA: Diagnosis present

## 2024-09-06 DIAGNOSIS — Z833 Family history of diabetes mellitus: Secondary | ICD-10-CM | POA: Diagnosis not present

## 2024-09-06 DIAGNOSIS — R4182 Altered mental status, unspecified: Secondary | ICD-10-CM | POA: Diagnosis present

## 2024-09-06 DIAGNOSIS — N401 Enlarged prostate with lower urinary tract symptoms: Secondary | ICD-10-CM | POA: Diagnosis present

## 2024-09-06 DIAGNOSIS — M1711 Unilateral primary osteoarthritis, right knee: Secondary | ICD-10-CM | POA: Diagnosis present

## 2024-09-06 DIAGNOSIS — G9341 Metabolic encephalopathy: Secondary | ICD-10-CM | POA: Diagnosis present

## 2024-09-06 DIAGNOSIS — Z8249 Family history of ischemic heart disease and other diseases of the circulatory system: Secondary | ICD-10-CM | POA: Diagnosis not present

## 2024-09-06 DIAGNOSIS — I2510 Atherosclerotic heart disease of native coronary artery without angina pectoris: Secondary | ICD-10-CM | POA: Diagnosis present

## 2024-09-06 DIAGNOSIS — Z5329 Procedure and treatment not carried out because of patient's decision for other reasons: Secondary | ICD-10-CM | POA: Diagnosis present

## 2024-09-06 DIAGNOSIS — I700 Atherosclerosis of aorta: Secondary | ICD-10-CM | POA: Diagnosis present

## 2024-09-06 DIAGNOSIS — E785 Hyperlipidemia, unspecified: Secondary | ICD-10-CM | POA: Diagnosis present

## 2024-09-06 DIAGNOSIS — Z79891 Long term (current) use of opiate analgesic: Secondary | ICD-10-CM | POA: Diagnosis not present

## 2024-09-06 LAB — CBC WITH DIFFERENTIAL/PLATELET
Abs Immature Granulocytes: 0.13 K/uL — ABNORMAL HIGH (ref 0.00–0.07)
Basophils Absolute: 0 K/uL (ref 0.0–0.1)
Basophils Relative: 0 %
Eosinophils Absolute: 0 K/uL (ref 0.0–0.5)
Eosinophils Relative: 0 %
HCT: 32 % — ABNORMAL LOW (ref 39.0–52.0)
Hemoglobin: 10.5 g/dL — ABNORMAL LOW (ref 13.0–17.0)
Immature Granulocytes: 1 %
Lymphocytes Relative: 3 %
Lymphs Abs: 0.7 K/uL (ref 0.7–4.0)
MCH: 29.3 pg (ref 26.0–34.0)
MCHC: 32.8 g/dL (ref 30.0–36.0)
MCV: 89.4 fL (ref 80.0–100.0)
Monocytes Absolute: 1.5 K/uL — ABNORMAL HIGH (ref 0.1–1.0)
Monocytes Relative: 7 %
Neutro Abs: 18.7 K/uL — ABNORMAL HIGH (ref 1.7–7.7)
Neutrophils Relative %: 89 %
Platelets: 210 K/uL (ref 150–400)
RBC: 3.58 MIL/uL — ABNORMAL LOW (ref 4.22–5.81)
RDW: 14.6 % (ref 11.5–15.5)
WBC: 21.1 K/uL — ABNORMAL HIGH (ref 4.0–10.5)
nRBC: 0 % (ref 0.0–0.2)

## 2024-09-06 LAB — COMPREHENSIVE METABOLIC PANEL WITH GFR
ALT: 13 U/L (ref 0–44)
ALT: 14 U/L (ref 0–44)
AST: 24 U/L (ref 15–41)
AST: 27 U/L (ref 15–41)
Albumin: 2.6 g/dL — ABNORMAL LOW (ref 3.5–5.0)
Albumin: 3.2 g/dL — ABNORMAL LOW (ref 3.5–5.0)
Alkaline Phosphatase: 51 U/L (ref 38–126)
Alkaline Phosphatase: 60 U/L (ref 38–126)
Anion gap: 12 (ref 5–15)
Anion gap: 13 (ref 5–15)
BUN: 13 mg/dL (ref 8–23)
BUN: 16 mg/dL (ref 8–23)
CO2: 18 mmol/L — ABNORMAL LOW (ref 22–32)
CO2: 22 mmol/L (ref 22–32)
Calcium: 8.1 mg/dL — ABNORMAL LOW (ref 8.9–10.3)
Calcium: 8.5 mg/dL — ABNORMAL LOW (ref 8.9–10.3)
Chloride: 100 mmol/L (ref 98–111)
Chloride: 105 mmol/L (ref 98–111)
Creatinine, Ser: 0.97 mg/dL (ref 0.61–1.24)
Creatinine, Ser: 1.02 mg/dL (ref 0.61–1.24)
GFR, Estimated: >60 mL/min (ref >60)
GFR, Estimated: >60 mL/min (ref >60)
Glucose, Bld: 91 mg/dL (ref 70–99)
Glucose, Bld: 95 mg/dL (ref 70–99)
Potassium: 3.7 mmol/L (ref 3.5–5.1)
Potassium: 3.9 mmol/L (ref 3.5–5.1)
Sodium: 134 mmol/L — ABNORMAL LOW (ref 135–145)
Sodium: 136 mmol/L (ref 135–145)
Total Bilirubin: 1.1 mg/dL (ref 0.0–1.2)
Total Bilirubin: 1.3 mg/dL — ABNORMAL HIGH (ref 0.0–1.2)
Total Protein: 6.2 g/dL — ABNORMAL LOW (ref 6.5–8.1)
Total Protein: 6.9 g/dL (ref 6.5–8.1)

## 2024-09-06 LAB — RESP PANEL BY RT-PCR (RSV, FLU A&B, COVID)  RVPGX2
Influenza A by PCR: NEGATIVE
Influenza B by PCR: NEGATIVE
Resp Syncytial Virus by PCR: NEGATIVE
SARS Coronavirus 2 by RT PCR: POSITIVE — AB

## 2024-09-06 LAB — PROTIME-INR
INR: 1.1 (ref 0.8–1.2)
Prothrombin Time: 14.4 s (ref 11.4–15.2)

## 2024-09-06 LAB — URINALYSIS, W/ REFLEX TO CULTURE (INFECTION SUSPECTED)
Bilirubin Urine: NEGATIVE
Glucose, UA: NEGATIVE mg/dL
Ketones, ur: 80 mg/dL — AB
Nitrite: POSITIVE — AB
Protein, ur: NEGATIVE mg/dL
Specific Gravity, Urine: 1.016 (ref 1.005–1.030)
WBC, UA: >50 WBC/hpf (ref 0–5)
pH: 5 (ref 5.0–8.0)

## 2024-09-06 LAB — I-STAT VENOUS BLOOD GAS, ED
Acid-base deficit: 6 mmol/L — ABNORMAL HIGH (ref 0.0–2.0)
Bicarbonate: 18.5 mmol/L — ABNORMAL LOW (ref 20.0–28.0)
Calcium, Ion: 1.14 mmol/L — ABNORMAL LOW (ref 1.15–1.40)
HCT: 30 % — ABNORMAL LOW (ref 39.0–52.0)
Hemoglobin: 10.2 g/dL — ABNORMAL LOW (ref 13.0–17.0)
O2 Saturation: 98 %
Potassium: 3.8 mmol/L (ref 3.5–5.1)
Sodium: 138 mmol/L (ref 135–145)
TCO2: 19 mmol/L — ABNORMAL LOW (ref 22–32)
pCO2, Ven: 32.1 mmHg — ABNORMAL LOW (ref 44–60)
pH, Ven: 7.367 (ref 7.25–7.43)
pO2, Ven: 113 mmHg — ABNORMAL HIGH (ref 32–45)

## 2024-09-06 LAB — CK: Total CK: 396 U/L (ref 49–397)

## 2024-09-06 LAB — FOLATE: Folate: 4.1 ng/mL — ABNORMAL LOW (ref >5.9)

## 2024-09-06 LAB — PROCALCITONIN: Procalcitonin: 0.41 ng/mL

## 2024-09-06 LAB — C-REACTIVE PROTEIN: CRP: 14.2 mg/dL — ABNORMAL HIGH (ref <1.0)

## 2024-09-06 LAB — TSH: TSH: 0.458 u[IU]/mL (ref 0.350–4.500)

## 2024-09-06 LAB — I-STAT CG4 LACTIC ACID, ED: Lactic Acid, Venous: 0.3 mmol/L — ABNORMAL LOW (ref 0.5–1.9)

## 2024-09-06 LAB — MAGNESIUM: Magnesium: 1.6 mg/dL — ABNORMAL LOW (ref 1.7–2.4)

## 2024-09-06 LAB — VITAMIN B12: Vitamin B-12: 403 pg/mL (ref 180–914)

## 2024-09-06 MED ORDER — ALBUTEROL SULFATE (2.5 MG/3ML) 0.083% IN NEBU: 2.5000 mg | INHALATION_SOLUTION | RESPIRATORY_TRACT | Status: DC | PRN

## 2024-09-06 MED ORDER — IPRATROPIUM-ALBUTEROL 0.5-2.5 (3) MG/3ML IN SOLN
3.0000 mL | Freq: Four times a day (QID) | RESPIRATORY_TRACT | Status: DC
  Administered 2024-09-06: 3 mL via RESPIRATORY_TRACT
  Filled 2024-09-06 (×2): qty 3

## 2024-09-06 MED ORDER — OXYCODONE HCL 5 MG PO TABS
5.0000 mg | ORAL_TABLET | Freq: Four times a day (QID) | ORAL | Status: DC | PRN
  Filled 2024-09-06: qty 1

## 2024-09-06 MED ORDER — OXYCODONE-ACETAMINOPHEN 10-325 MG PO TABS: 1.0000 | ORAL_TABLET | Freq: Four times a day (QID) | ORAL | Status: DC | PRN

## 2024-09-06 MED ORDER — MAGNESIUM SULFATE 2 GM/50ML IV SOLN
2.0000 g | Freq: Once | INTRAVENOUS | Status: AC
  Administered 2024-09-06: 2 g via INTRAVENOUS
  Filled 2024-09-06: qty 50

## 2024-09-06 MED ORDER — GUAIFENESIN ER 600 MG PO TB12
600.0000 mg | ORAL_TABLET | Freq: Two times a day (BID) | ORAL | Status: DC
  Administered 2024-09-06: 600 mg via ORAL
  Filled 2024-09-06: qty 1

## 2024-09-06 MED ORDER — DEXAMETHASONE SODIUM PHOSPHATE 10 MG/ML IJ SOLN
10.0000 mg | Freq: Once | INTRAMUSCULAR | Status: AC
  Administered 2024-09-06: 10 mg via INTRAVENOUS
  Filled 2024-09-06: qty 1

## 2024-09-06 MED ORDER — SODIUM CHLORIDE 0.9 % IV SOLN
2.0000 g | Freq: Once | INTRAVENOUS | Status: AC
  Administered 2024-09-06: 2 g via INTRAVENOUS
  Filled 2024-09-06: qty 20

## 2024-09-06 MED ORDER — ASPIRIN 81 MG PO TBEC
81.0000 mg | DELAYED_RELEASE_TABLET | Freq: Every day | ORAL | Status: DC
  Administered 2024-09-06: 81 mg via ORAL
  Filled 2024-09-06: qty 1

## 2024-09-06 MED ORDER — LACTATED RINGERS IV SOLN: INTRAVENOUS | Status: DC

## 2024-09-06 MED ORDER — ROSUVASTATIN CALCIUM 20 MG PO TABS
40.0000 mg | ORAL_TABLET | ORAL | Status: DC
  Administered 2024-09-06: 40 mg via ORAL
  Filled 2024-09-06: qty 2

## 2024-09-06 MED ORDER — SODIUM CHLORIDE 0.9 % IV SOLN: 1.0000 g | INTRAVENOUS | Status: DC

## 2024-09-06 MED ORDER — OXYCODONE-ACETAMINOPHEN 10-325 MG PO TABS: 1.0000 | ORAL_TABLET | ORAL | Status: DC | PRN

## 2024-09-06 MED ORDER — ACETAMINOPHEN 500 MG PO TABS
1000.0000 mg | ORAL_TABLET | Freq: Three times a day (TID) | ORAL | Status: DC
Start: 2024-09-06 — End: 2024-09-06
  Administered 2024-09-06: 1000 mg via ORAL
  Filled 2024-09-06 (×2): qty 2

## 2024-09-06 MED ORDER — ACETAMINOPHEN 325 MG PO TABS: 650.0000 mg | ORAL_TABLET | Freq: Four times a day (QID) | ORAL | Status: DC | PRN

## 2024-09-06 MED ORDER — ACETAMINOPHEN 650 MG RE SUPP
650.0000 mg | Freq: Once | RECTAL | Status: AC
  Administered 2024-09-06: 650 mg via RECTAL
  Filled 2024-09-06: qty 1

## 2024-09-06 MED ORDER — SODIUM CHLORIDE 0.9 % IV SOLN
500.0000 mg | Freq: Once | INTRAVENOUS | Status: AC
  Administered 2024-09-06: 500 mg via INTRAVENOUS
  Filled 2024-09-06: qty 5

## 2024-09-06 MED ORDER — SODIUM CHLORIDE 0.9 % IV SOLN: 500.0000 mg | INTRAVENOUS | Status: DC

## 2024-09-06 MED ORDER — OXYCODONE HCL 5 MG PO TABS: 5.0000 mg | ORAL_TABLET | Freq: Four times a day (QID) | ORAL | Status: DC | PRN

## 2024-09-06 MED ORDER — ONDANSETRON HCL 4 MG/2ML IJ SOLN: 4.0000 mg | Freq: Four times a day (QID) | INTRAMUSCULAR | Status: DC | PRN

## 2024-09-06 MED ORDER — ACETAMINOPHEN 650 MG RE SUPP: 650.0000 mg | Freq: Four times a day (QID) | RECTAL | Status: DC | PRN

## 2024-09-06 MED ORDER — VANCOMYCIN HCL 1500 MG/300ML IV SOLN
1500.0000 mg | Freq: Once | INTRAVENOUS | Status: AC
  Administered 2024-09-06: 1500 mg via INTRAVENOUS
  Filled 2024-09-06: qty 300

## 2024-09-06 MED ORDER — PREGABALIN 100 MG PO CAPS: 200.0000 mg | ORAL_CAPSULE | Freq: Every day | ORAL | Status: DC

## 2024-09-06 MED ORDER — LACTATED RINGERS IV BOLUS
1000.0000 mL | Freq: Once | INTRAVENOUS | Status: AC
  Administered 2024-09-06: 1000 mL via INTRAVENOUS

## 2024-09-06 MED ORDER — MELATONIN 3 MG PO TABS: 3.0000 mg | ORAL_TABLET | Freq: Every evening | ORAL | Status: DC | PRN

## 2024-09-06 MED ORDER — OXYCODONE-ACETAMINOPHEN 5-325 MG PO TABS: 1.0000 | ORAL_TABLET | Freq: Four times a day (QID) | ORAL | Status: DC | PRN

## 2024-09-06 NOTE — ED Notes (Signed)
 Pt called out to have room phone handed to him , nurse answered call promptly and instructed pt on instructions to use phone
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
## 2024-09-06 NOTE — ED Notes (Signed)
 MD at bedside.

## 2024-09-06 NOTE — ED Notes (Signed)
 Pt refused to let wife go to car and retreive clothes had previously decline paper scrubs. Wife then states  why didn't yall give him his pain meds, advised wife pt had refused oxycodone  5 mg that was ordered and the new order for percocet had only been placed after pt wanted to leave hospital. Wife advised that nurse is incorrect and pt does not take percocet that he takes oxycodone  10-325mg  nurse advised that is oxycodone  which is mixed with tylenol  that together is called percocet.  Wife then attempts to pull meds out of her purse to give to pt, nurse kindly advised to please wait until then get Dc'd and to the car as pts aren't allowed to take home meds inside the hospital esp narcotics. Wife shows RN bottle and states  can you not read that right there this is NOT percocet you do not know what you're talking about. Wife leaves bedside to go get car. Pt assisted into WC in gown IV removed. When pt stood up from bed a vape pen used for nicotine fell onto bed. Pt advised not allowed to have vapes or nicotine products in room for future visits.  Pt continue to be verbally abusive to staff. Dc paperwork reviewed

## 2024-09-06 NOTE — Progress Notes (Addendum)
 Clinical/Bedside Swallow Evaluation Patient Details  Name: Donald Roberts MRN: [id number] Date of Birth: [birth year]  Today's Date: 09/06/2024 Time: SLP Start Time (ACUTE ONLY): 9066 SLP Stop Time (ACUTE ONLY): 0950 SLP Time Calculation (min) (ACUTE ONLY): 17 min  Past Medical History:  Past Medical History:  Diagnosis Date   Abnormal gait    Allergic rhinitis, cause unspecified 07/18/2008   Back pain, chronic 10/01/2011   CAD (coronary artery disease)    Carpal tunnel syndrome on left    Chronic low back pain 12/25/2012   Degenerative arthritis of right knee 10/01/2011   Diverticulosis of colon (without mention of hemorrhage) 07/18/2008   Gross hematuria 07/18/2008   Headache(784.0)    Hx: of Migraines as a child   Hyperlipidemia 07/18/2008   Hypertension    Idiopathic progressive neuropathy    Personal history of colonic polyps 07/18/2008   PVD (peripheral vascular disease)    Tobacco use disorder 04/06/2009   Past Surgical History:  Past Surgical History:  Procedure Laterality Date   ANTERIOR CERVICAL DECOMP/DISCECTOMY FUSION N/A 09/10/2013   Procedure: ANTERIOR CERVICAL DECOMPRESSION/DISCECTOMY FUSION CERVICAL FOUR-FIVE,CERVICAL FIVE-SIX WITH PLATING AND BONE GRAFT;  Surgeon: Catalina CHRISTELLA Stains, MD;  Location: [facility];  Service: Neurosurgery;  Laterality: N/A;   ANTERIOR CERVICAL DECOMP/DISCECTOMY FUSION N/A 03/14/2015   Procedure: ANTERIOR CERVICAL DECOMPRESSION/DISCECTOMY FUSION  CERVICAL TWO-THREE;  Surgeon: Catalina Stains, MD;  Location: [facility];  Service: Neurosurgery;  Laterality: N/A;   ANTERIOR CERVICAL DECOMP/DISCECTOMY FUSION N/A 10/03/2020   Procedure: Anterior Cervial Discectomy and Fusion Cervical three and cervical four;  Surgeon: Louis Shove, MD;  Location: [facility] OR;  Service: Neurosurgery;  Laterality: N/A;  3C   CARDIAC CATHETERIZATION     COLONOSCOPY     COLONOSCOPY W/ BIOPSIES AND POLYPECTOMY     Hx: of   LEFT HEART CATH AND CORONARY ANGIOGRAPHY N/A  11/04/2018   Procedure: LEFT HEART CATH AND CORONARY ANGIOGRAPHY;  Surgeon: Claudene Pacific, MD;  Location: [facility];  Service: Cardiovascular;  Laterality: N/A;   LEFT HEART CATH AND CORONARY ANGIOGRAPHY N/A 01/15/2023   Procedure: LEFT HEART CATH AND CORONARY ANGIOGRAPHY;  Surgeon: Anner Alm ORN, MD;  Location: [facility] INVASIVE CV LAB;  Service: Cardiovascular;  Laterality: N/A;   LEFT HEART CATH AND CORONARY ANGIOGRAPHY N/A 08/25/2024   Procedure: LEFT HEART CATH AND CORONARY ANGIOGRAPHY;  Surgeon: Anner Alm ORN, MD;  Location: [facility] INVASIVE CV LAB;  Service: Cardiovascular;  Laterality: N/A;   lumbar surgury     dr botero/ns; 3 level fusion per pt   POLYPECTOMY     s/p cervical disease x 2     HPI:  Donald Roberts is a 73 y.o. male with medical history significant for chronic low back pain on chronic opioid therapy, ACDF surgeries 2014, 2016 and 2921, hyperlipidemia, who is admitted to [facility] on 09/05/2024 with acute metabolic encephalopathy in the setting of severe sepsis due to commune acquired pneumonia, UTI, and COVID-19 infection after presenting from home to [facility] ED complaining of altered mental status. CXR 08/06/24 no active cardiopulmonary disease.  MBS 10 yrs ago 2015 with narrowed oropharyngeal cavity d/t presence of cervical hardware, mild silent aspiration, decreased UES opening, head turn left or right slightly improved airway protection, aspiration eliminted with large sips. Rec continue regular avoiding difficult textures with left head turn. FEES [facility] at [facility] Pt presents with grossly intact oral swallow of thin liquids. FEES conducted with Dr. Maree had to be abandoned due to patient's  inability to tolerate scope. Pt was not able to replicate normal eating position or relaxation. No penetration or aspiration was observed during exam. Pt states swallow study maybe 4 yrs ago. SLP could not find recent documentation since 2016.    Assessment / Plan / Recommendation   Clinical Impression  Pt has history of dysphagia from 10 yrs ago and state he occasionally has difficulty swallowing but stated I am not worried about my swallow but about swelling in my testicle. He has full intact dentition, strong cough and no oromotor impairments. There were no s/s aspiration with any consistency although remote history of silent aspiration. Orally masticated cracker timely without residue. Able to take pills without difficulty per RN. Pt has pna per CXR that MD stated as community acquired pneumonia however with history of dysphagia places him at higher risk. Recommended MBS however pt politely declined today after extended discussion. Recommend continue regular texture, thin liquids, pills with thin. SLP will plan to follow tomorrow and determine if pt willing to participate in MBS. SLP Visit Diagnosis: Dysphagia, unspecified (R13.10);Aphasia (R47.01)    Aspiration Risk  Mild aspiration risk    Diet Recommendation Regular;Thin liquid    Liquid Administration via: Cup;Straw Medication Administration: Whole meds with liquid Supervision: Patient able to self feed Compensations: Slow rate;Small sips/bites Postural Changes: Seated upright at 90 degrees    Other  Recommendations Oral Care Recommendations: Oral care BID     Assistance Recommended at Discharge    Functional Status Assessment Patient has had a recent decline in their functional status and demonstrates the ability to make significant improvements in function in a reasonable and predictable amount of time.  Frequency and Duration min 2x/week  2 weeks       Prognosis Prognosis for improved oropharyngeal function: Good Barriers to Reach Goals:  (motivation to assess swallow)      Swallow Study   General HPI: Donald Roberts is a 73 y.o. male with medical history significant for chronic low back pain on chronic opioid therapy, hyperlipidemia, who is admitted to [facility] on 09/05/2024 with acute  metabolic encephalopathy in the setting of severe sepsis due to commune acquired pneumonia, UTI, and COVID-19 infection after presenting from home to [facility] ED complaining of altered mental status. CXR 08/06/24 no active cardiopulmonary disease.  MBS 10 yrs ago 2015 with narrowed oropharyngeal cavity d/t presence of cervical hardware, mild silent aspiration, decreased UES opening, head turn left or right slightly improved airway protection, aspiration eliminted with large sips. Rec continue regular avoiding difficult textures with left head turn. FEES 2016 at [facility] Pt presents with grossly intact oral swallow of thin liquids. FEES conducted with Dr. Maree had to be abandoned due to patient's inability to tolerate scope. Pt was not able to replicate normal eating position or relaxation. No penetration or aspiration was observed during exam. Pt states swallow study maybe 4 yrs ago. SLP could not find recent documentation since 2016. Type of Study: Bedside Swallow Evaluation Previous Swallow Assessment:  (see HPI) Diet Prior to this Study: Regular;Thin liquids (Level 0) Temperature Spikes Noted: No Respiratory Status: Room air History of Recent Intubation: No Behavior/Cognition: Alert;Cooperative Oral Cavity Assessment: Within Functional Limits Oral Care Completed by SLP: No Oral Cavity - Dentition: Adequate natural dentition Vision: Functional for self-feeding Self-Feeding Abilities: Able to feed self Patient Positioning: Upright in bed Baseline Vocal Quality: Normal Volitional Cough: Strong Volitional Swallow: Able to elicit    Oral/Motor/Sensory Function Overall Oral Motor/Sensory Function: Within functional limits  Ice Chips Ice chips: Not tested   Thin Liquid Thin Liquid: Within functional limits Presentation: Cup;Straw    Nectar Thick Nectar Thick Liquid: Not tested   Honey Thick Honey Thick Liquid: Not tested   Puree Puree: Within functional limits   Solid     Solid: Within functional  limits      Nader Boys, Olam Bull 09/06/2024,10:21 AM

## 2024-09-06 NOTE — ED Notes (Signed)
 CCMD called to report patient's discharge.

## 2024-09-06 NOTE — Progress Notes (Signed)
 ED Pharmacy Antibiotic Sign Off An antibiotic consult was received from an ED provider for Vancomycin  per pharmacy dosing for sepsis. A chart review was completed to assess appropriateness.   The following one time order(s) were placed:  Vancomycin 1500 mg IV  Further antibiotic and/or antibiotic pharmacy consults should be ordered by the admitting provider if indicated.   Dail Cordella Misty, [facility]  Clinical Pharmacist 09/06/24 3:01 AM

## 2024-09-06 NOTE — ED Notes (Signed)
 Assisted pt to RR X1 person assist

## 2024-09-06 NOTE — ED Notes (Signed)
 Pt refusing to let NT take him upstairs will not let staff roll his bed out of room. States he  gave us  a deadline of 1700 and how dare we come in 8 min before he is going home. Pt able ot answer time place situation and name.

## 2024-09-06 NOTE — Telephone Encounter (Signed)
 Patient scheduled for next available work in- 10/23 @ 10:30.

## 2024-09-06 NOTE — ED Notes (Signed)
 Made Md aware of pt and family report of right testicle swelling, no pain with swelling and no increse in swelling since last report. Advised family in pt Md made aware

## 2024-09-06 NOTE — ED Notes (Signed)
 Pt self removed EKG leads

## 2024-09-06 NOTE — ED Notes (Signed)
 Called pt wife cell phone and home phone listed no answer X 2 MD made aware of pt wishes and delay

## 2024-09-06 NOTE — ED Notes (Signed)
 Pt states I am not having any trouble breathing why would I need a neb treatment I dont need that or tylenol  I belive JFK that's a smart man I dont want any of that shit.

## 2024-09-06 NOTE — ED Notes (Signed)
 Pt stated that his right testicle is swollen and has been for a few days, denies pain. MD notified.

## 2024-09-06 NOTE — Discharge Summary (Addendum)
 Patient left AMA.  Refused to go upstairs when he had Hever Castilleja bed (he'd set Joslin Doell self imposed time limit of 5 oclock) and when he was about to be transferred upstairs refused to go.  Discussed with patient over phone, he's Zara Wendt&Ox3.  I conveyed he's here for severe infection and my medical recommendation is that he say - risk of AMA d/c include worsening infection, death, and disability.  I spoke to his daughter (before I spoke to him and she noted that he was upset we weren't giving his home pain meds - I explained in setting of AMS/encephalopathy, we often reduce the dose out of abundance of caution - I adjusted dose of oxycodone  and had RN offer, but he refused).  Spoke to his wife after I spoke to him, she was on her way, noted if she could convince him to stay, we'd continue to care for him, but we can't keep against his will.  Patient discharged AMA - see previous RN notes (made threat regarding leaving unhook me before I hurt somebody - RN note from 10/6 1711).

## 2024-09-06 NOTE — ED Notes (Signed)
 Wife at bedside.

## 2024-09-06 NOTE — ED Notes (Addendum)
 Pt refusing home medication stating I aint taking anything I want to go home. MD called nurses station and wanted ot be connected to pt room, re routed call to secretary desk. Nurse requested MD to come to bedside.  Advised MD pt is a/oX3 and able to make descions, MD advised he spoke to wife and wife in on the way to hospital

## 2024-09-06 NOTE — ED Notes (Signed)
 Pt asked what type of pain medication nurse advised 5 mg oxycodone , pt states  I don't belive you considering your track record Nurse  showed pt packed medication and pt states I take 325 mg Oxycodone  3 times a day advised pt this medication doesn't come in that amount or milligram and that this 5 mg oxycodone  is safe and prescribed medication fro the doctor. Pt refused med and threw it on the table stating  I aint taking this shit. Pt remains alert to name place and year.

## 2024-09-06 NOTE — ED Notes (Signed)
 Pt verbalizes understanding of pain medication risks and benefits. Pt agrees with fall precuations

## 2024-09-06 NOTE — Progress Notes (Addendum)
 PROGRESS NOTE    [name]  FMW:[id number] DOB: 08/29/1951 DOA: 09/05/2024 PCP: [place], Betty G, MD  Chief Complaint  Patient presents with   Code Sepsis    Brief Narrative:    Donald Roberts is Donald Roberts 73 y.o. male with medical history significant for chronic low back pain on chronic opioid therapy, hyperlipidemia, who is admitted to [facility] on 09/05/2024 with acute metabolic encephalopathy in the setting of severe sepsis due to commune acquired pneumonia, UTI, and COVID-19 infection after presenting from home to [facility] ED complaining of altered mental status.   Assessment & Plan:   Principal Problem:   Acute metabolic encephalopathy Active Problems:   Chronic low back pain   HLD (hyperlipidemia)   Severe sepsis (HCC)   CAP (community acquired pneumonia)   Acute cystitis   Leukocytosis   Fever   COVID-19 virus infection   Hypomagnesemia  Addendum C/o R testicular swelling - no pain.  Started sometime today, sometime after I saw him this AM?  Unclear.  Exam with no TTP, but mild R testicular swelling when compared to L.  Follow scrotal US .  Acute Metabolic Encephalopathy In setting of UTI and pneumonia below Follow TSH, b12, ammonia, folate, venous blood gas (without hypercarbia) UDS No report of LOC or seizure like activity - will defer EEG for now  Unsuccessful LP attempt in ED, but UTI/pneumonia seem more likely causes of presentation Note, he's on chronic percocet and lyrica  at home Delirium precautions   Sepsis due to Urinary Tract Infection and Community Acquired Pneumonia Acute Hypoxic Respiratory Failure due to CAP  Rules in for sepsis with fever, leukocytosis, tachycardia, tachypnea at presentation UA c/w UTI with + nitrite, LE, many bacteria  CXR with opacity at R lung base  Suspect + covid result represents his prior infection in August rather than recurrence (will continue airborne for now out of an abundance of caution) Blood cultures pending Urine  culture pending  Continue ceftriaxone, azithromycin  Wean O2 as tolerated  Recurrent UTI's LUTS Family notes recurrent UTI's He should follow with urology outpatient to discuss options based on outpatient cultures  May consider flomax to treat BPH empirically given description of LUTS  Chronic Low Back Pain Percocet and lyrica  currently on hold  With improvement, I think these can be restarted cautiously   Dyslipidemia CAD Statin, asa Recent LHC 08/2024    DVT prophylaxis: SCD Code Status: full Family Communication: wife, daughter Disposition:   Status is: Inpatient Remains inpatient appropriate because: need for continued inpatient care   Consultants:  none  Procedures:  LP aborted  Antimicrobials:  Anti-infectives (From admission, onward)    Start     Dose/Rate Route Frequency Ordered Stop   09/06/24 2200  cefTRIAXone (ROCEPHIN) 1 g in sodium chloride  0.9 % 100 mL IVPB        1 g 200 mL/hr over 30 Minutes Intravenous Every 24 hours 09/06/24 0445     09/06/24 2200  azithromycin (ZITHROMAX) 500 mg in sodium chloride  0.9 % 250 mL IVPB        500 mg 250 mL/hr over 60 Minutes Intravenous Every 24 hours 09/06/24 0508     09/06/24 0330  vancomycin (VANCOREADY) IVPB 1500 mg/300 mL        1,500 mg 150 mL/hr over 120 Minutes Intravenous  Once 09/06/24 0301 09/06/24 0553   09/06/24 0130  cefTRIAXone (ROCEPHIN) 2 g in sodium chloride  0.9 % 100 mL IVPB  2 g 200 mL/hr over 30 Minutes Intravenous Once 09/06/24 0122 09/06/24 0208   09/06/24 0130  azithromycin (ZITHROMAX) 500 mg in sodium chloride  0.9 % 250 mL IVPB        500 mg 250 mL/hr over 60 Minutes Intravenous  Once 09/06/24 0122 09/06/24 0302       Subjective: Less confused this AM Denies pain or discomfort  Wife at bedside, daughter over phone - notes he was unresponsive prior to presentation   Objective: Vitals:   09/06/24 0530 09/06/24 0600 09/06/24 0745 09/06/24 0800  BP: 119/69 121/67 132/67 120/73   Pulse: 66 62 77 74  Resp: 15 15 20  (!) 23  Temp:      TempSrc:      SpO2: 96% 99% 100% 98%  Weight:      Height:       No intake or output data in the 24 hours ending 09/06/24 0834 Filed Weights   09/05/24 2333  Weight: 82.1 kg    Examination:  General exam: NAD Respiratory system: R sided wheezing, unlabored Cardiovascular system: RRR Gastrointestinal system: Abdomen is nondistended, soft and nontender. Central nervous system: Alert and oriented to self, location, month - but doesn't remember how he got here. No focal neurological deficits. Extremities: no LEE   Data Reviewed: I have personally reviewed following labs and imaging studies  CBC: Recent Labs  Lab 09/05/24 2334 09/05/24 2344 09/06/24 0455 09/06/24 0734  WBC 16.6*  --  21.1*  --   NEUTROABS 14.3*  --  18.7*  --   HGB 11.1* 11.6* 10.5* 10.2*  HCT 34.3* 34.0* 32.0* 30.0*  MCV 88.9  --  89.4  --   PLT 247  --  210  --     Basic Metabolic Panel: Recent Labs  Lab 09/05/24 2334 09/05/24 2344 09/06/24 0455 09/06/24 0734  NA 134* 135 136 138  K 3.9 4.1 3.7 3.8  CL 100 100 105  --   CO2 22  --  18*  --   GLUCOSE 95 90 91  --   BUN 16 17 13   --   CREATININE 1.02 1.00 0.97  --   CALCIUM  8.5*  --  8.1*  --   MG  --   --  1.6*  --     GFR: Estimated Creatinine Clearance: 71.1 mL/min (by C-G formula based on SCr of 0.97 mg/dL).  Liver Function Tests: Recent Labs  Lab 09/05/24 2334 09/06/24 0455  AST 27 24  ALT 14 13  ALKPHOS 60 51  BILITOT 1.1 1.3*  PROT 6.9 6.2*  ALBUMIN 3.2* 2.6*    CBG: No results for input(s): GLUCAP in the last 168 hours.   Recent Results (from the past 240 hours)  Resp panel by RT-PCR (RSV, Flu Hughey Rittenberry&B, Covid) Anterior Nasal Swab     Status: Abnormal   Collection Time: 09/05/24 11:37 PM   Specimen: Anterior Nasal Swab  Result Value Ref Range Status   SARS Coronavirus 2 by RT PCR POSITIVE (Landon Truax) NEGATIVE Final   Influenza Nikoleta Dady by PCR NEGATIVE NEGATIVE Final    Influenza B by PCR NEGATIVE NEGATIVE Final    Comment: (NOTE) The Xpert Xpress SARS-CoV-2/FLU/RSV plus assay is intended as an aid in the diagnosis of influenza from Nasopharyngeal swab specimens and should not be used as Mckay Brandt sole basis for treatment. Nasal washings and aspirates are unacceptable for Xpert Xpress SARS-CoV-2/FLU/RSV testing.  Fact Sheet for Patients: BloggerCourse.com  Fact Sheet for Healthcare Providers: SeriousBroker.it  This test  is not yet approved or cleared by the [place]  FDA and has been authorized for detection and/or diagnosis of SARS-CoV-2 by FDA under an Emergency Use Authorization (EUA). This EUA will remain in effect (meaning this test can be used) for the duration of the COVID-19 declaration under Section 564(b)(1) of the Act, 21 U.S.C. section 360bbb-3(b)(1), unless the authorization is terminated or revoked.     Resp Syncytial Virus by PCR NEGATIVE NEGATIVE Final    Comment: (NOTE) Fact Sheet for Patients: BloggerCourse.com  Fact Sheet for Healthcare Providers: SeriousBroker.it  This test is not yet approved or cleared by the [place]  FDA and has been authorized for detection and/or diagnosis of SARS-CoV-2 by FDA under an Emergency Use Authorization (EUA). This EUA will remain in effect (meaning this test can be used) for the duration of the COVID-19 declaration under Section 564(b)(1) of the Act, 21 U.S.C. section 360bbb-3(b)(1), unless the authorization is terminated or revoked.  Performed at [facility], [address], [place], [place] [address]          Radiology Studies: CT Head Wo Contrast Result Date: 09/06/2024 EXAM: CT HEAD WITHOUT CONTRAST 09/06/2024 01:48:19 AM TECHNIQUE: CT of the head was performed without the administration of intravenous contrast. Automated exposure control, iterative reconstruction, and/or  weight based adjustment of the mA/kV was utilized to reduce the radiation dose to as low as reasonably achievable. COMPARISON: None available. CLINICAL HISTORY: Mental status change, unknown cause. FINDINGS: BRAIN AND VENTRICLES: Parenchymal volume loss is commensurate with the patient's age. Periventricular white matter changes are present likely reflecting the sequela of small vessel ischemia. No acute hemorrhage. No evidence of acute infarct. No hydrocephalus. No extra-axial collection. No mass effect or midline shift. ORBITS: No acute abnormality. SINUSES: Small layering fluid within the right sphenoid sinus. SOFT TISSUES AND SKULL: No acute soft tissue abnormality. No skull fracture. IMPRESSION: 1. No acute intracranial abnormality. 2. Small layering fluid within the right sphenoid sinus. Electronically signed by: Dorethia Molt MD 09/06/2024 01:58 AM EDT RP Workstation: [id number]   DG Chest Port 1 View Result Date: 09/06/2024 EXAM: 1 VIEW XRAY OF THE CHEST 09/05/2024 11:46:32 PM COMPARISON: 08/06/2024 CLINICAL HISTORY: Questionable sepsis - evaluate for abnormality. [facility] as Krystine Pabst suspected Code Sepsis. ~630PM pt was feeling fine. Shortly after he had [place] his [place]. Fever, lethargic FINDINGS: LUNGS AND PLEURA: Increased opacity at the right lung base could indicate developing consolidation. No pulmonary edema. No pleural effusion. No pneumothorax. HEART AND MEDIASTINUM: Aortic atherosclerosis. No acute abnormality of the cardiac and mediastinal silhouettes. BONES AND SOFT TISSUES: Partially visualized cervical spine fusion hardware. No acute osseous abnormality. IMPRESSION: 1. Increased opacity at the right lung base, possibly representing developing consolidation. 2. Aortic atherosclerosis. 3. Partially visualized cervical spine fusion hardware. Electronically signed by: Franky Stanford MD 09/06/2024 12:34 AM EDT RP Workstation: [id number]        Scheduled Meds:  aspirin  EC  81 mg Oral  Daily   guaiFENesin  600 mg Oral BID   rosuvastatin   40 mg Oral Once per day on Monday Wednesday Friday   Continuous Infusions:  azithromycin     cefTRIAXone (ROCEPHIN)  IV     lactated ringers  150 mL/hr at 09/06/24 0148     LOS: 0 days    Time spent: over 30 min     Meliton Monte, MD [facility]   To contact the attending provider between 7A-7P or the covering provider during after hours 7P-7A, please log into the  web site [email or web address] and access using universal [place] password for that web site. If you do not have the password, please call the hospital operator.  09/06/2024, 8:34 AM

## 2024-09-06 NOTE — ED Notes (Signed)
 Second RN to bedside to advise pt bed is ready, pt made threats to second RN and was combative towards vitals equipment. Stating  I can tell military time and you came in here at 1652 that's 8 min before I told you I was leaving its too late for a room honey I wanted one hours ago I am going home now unhook me before I hurt somebody. MD and charge RN made aware of conversation.

## 2024-09-06 NOTE — ED Notes (Signed)
 Pt transported to ct by primary RN while on monitor.   Ice pack placed on abdomen and under arms per verbal order from MD.

## 2024-09-06 NOTE — ED Notes (Signed)
 Pt condom cath leaking changed gown and bedding, removed condom cath and provided pt with urinal

## 2024-09-06 NOTE — ED Notes (Signed)
 Pt on room phone with MD now at this time

## 2024-09-06 NOTE — ED Notes (Signed)
 Bed ready and in pt hand off complete in chart. Pt and all belongings transported upstairs via NT

## 2024-09-06 NOTE — Sepsis Progress Note (Signed)
 Elink monitoring for the code sepsis protocol.

## 2024-09-06 NOTE — ED Notes (Signed)
 US  at bedside pt refuses to go to US 

## 2024-09-06 NOTE — ED Notes (Signed)
 Pt reporting swelling of his right testicle, MD notified.

## 2024-09-06 NOTE — ED Notes (Signed)
 When RN attempt to understand if testicle is causing pain pt states  it doesn't hurt but its causing me concern and I want it looked at Md made aware of report. Pt then states  If I don't have a room today by 1730 I am leaving. Rn made pt and family aware of AMA process

## 2024-09-06 NOTE — Telephone Encounter (Signed)
 Per wife Donald Roberts states patient is currently hospitalized at [facility] in [place] due to testicular swelling. Patient is needing hospital follow up with Dr. Nicholaus in [place] Donald Roberts states estimated date for discharge is Wednesday  Call back is [phone] can leave message

## 2024-09-06 NOTE — ED Notes (Signed)
 Offered pt pain medication states  I never said it was hurt I just said its swollen and I want a docotor to come look at it what if it kills me Md aware

## 2024-09-06 NOTE — ED Notes (Signed)
 Pt status changed to DC, security called to bedside to stand by for RN to remove IVs
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
## 2024-09-08 ENCOUNTER — Ambulatory Visit: Admitting: Family Medicine

## 2024-09-08 ENCOUNTER — Encounter: Payer: Self-pay | Admitting: Family Medicine

## 2024-09-08 VITALS — BP 136/78 | HR 80 | Temp 97.9°F | Resp 16 | Ht 70.0 in | Wt 168.2 lb

## 2024-09-08 DIAGNOSIS — J189 Pneumonia, unspecified organism: Secondary | ICD-10-CM

## 2024-09-08 DIAGNOSIS — G959 Disease of spinal cord, unspecified: Secondary | ICD-10-CM | POA: Diagnosis not present

## 2024-09-08 DIAGNOSIS — N390 Urinary tract infection, site not specified: Secondary | ICD-10-CM | POA: Diagnosis not present

## 2024-09-08 DIAGNOSIS — N5089 Other specified disorders of the male genital organs: Secondary | ICD-10-CM | POA: Diagnosis not present

## 2024-09-08 LAB — URINE CULTURE: Culture: >=100000 — AB

## 2024-09-08 MED ORDER — SULFAMETHOXAZOLE-TRIMETHOPRIM 800-160 MG PO TABS: 1.0000 | ORAL_TABLET | Freq: Two times a day (BID) | ORAL | 0 refills | Status: AC

## 2024-09-08 NOTE — Assessment & Plan Note (Signed)
Following with pain management. Currently on Hydrocodone-Acetaminophen 10-325 mg q 3-4 hours and Lyrica 200 mg 1-2 times per day (also for peripheral neuropathy.

## 2024-09-08 NOTE — Patient Instructions (Signed)
 A few things to remember from today's visit:  Community acquired pneumonia of right lower lobe of lung - Plan: CBC with Differential/Platelet, C-reactive protein, Basic metabolic panel with GFR, Magnesium , Magnesium , Basic metabolic panel with GFR, C-reactive protein, CBC with Differential/Platelet, DG Chest 2 View  Urinary tract infection without hematuria, site unspecified - Plan: sulfamethoxazole-trimethoprim (BACTRIM DS) 800-160 MG tablet  Tomorrow Chest X ray at [facility]. Bactrim 2 times daily to treat uti. Check temp daily. Keep appt with urologist.  If you need refills for medications you take chronically, please call your pharmacy. Do not use My Chart to request refills or for acute issues that need immediate attention. If you send a my chart message, it may take a few days to be addressed, specially if I am not in the office.  Please be sure medication list is accurate. If a new problem present, please set up appointment sooner than planned today.
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
## 2024-09-09 ENCOUNTER — Ambulatory Visit (INDEPENDENT_AMBULATORY_CARE_PROVIDER_SITE_OTHER)
Admission: RE | Admit: 2024-09-09 | Discharge: 2024-09-09 | Disposition: A | Discharge status: Home / Self Care | Source: Ambulatory Visit | Attending: Family Medicine | Admitting: Family Medicine

## 2024-09-09 ENCOUNTER — Encounter: Payer: Self-pay | Admitting: Physician Assistant

## 2024-09-09 ENCOUNTER — Ambulatory Visit: Payer: Self-pay | Admitting: Family Medicine

## 2024-09-09 ENCOUNTER — Telehealth (HOSPITAL_BASED_OUTPATIENT_CLINIC_OR_DEPARTMENT_OTHER): Payer: Self-pay | Admitting: Emergency Medicine

## 2024-09-09 ENCOUNTER — Ambulatory Visit: Attending: Cardiology | Admitting: Physician Assistant

## 2024-09-09 VITALS — BP 105/54 | HR 86 | Ht 70.0 in | Wt 169.8 lb

## 2024-09-09 DIAGNOSIS — E7800 Pure hypercholesterolemia, unspecified: Secondary | ICD-10-CM

## 2024-09-09 DIAGNOSIS — I2510 Atherosclerotic heart disease of native coronary artery without angina pectoris: Secondary | ICD-10-CM

## 2024-09-09 DIAGNOSIS — J189 Pneumonia, unspecified organism: Secondary | ICD-10-CM

## 2024-09-09 DIAGNOSIS — I739 Peripheral vascular disease, unspecified: Secondary | ICD-10-CM

## 2024-09-09 DIAGNOSIS — R5383 Other fatigue: Secondary | ICD-10-CM | POA: Diagnosis not present

## 2024-09-09 DIAGNOSIS — R079 Chest pain, unspecified: Secondary | ICD-10-CM

## 2024-09-09 DIAGNOSIS — I10 Essential (primary) hypertension: Secondary | ICD-10-CM

## 2024-09-09 LAB — CBC WITH DIFFERENTIAL/PLATELET
Basophils Absolute: 0 K/uL (ref 0.0–0.1)
Basophils Relative: 0.3 % (ref 0.0–3.0)
Eosinophils Absolute: 0 K/uL (ref 0.0–0.7)
Eosinophils Relative: 0.6 % (ref 0.0–5.0)
HCT: 32.6 % — ABNORMAL LOW (ref 39.0–52.0)
Hemoglobin: 10.8 g/dL — ABNORMAL LOW (ref 13.0–17.0)
Lymphocytes Relative: 11.6 % — ABNORMAL LOW (ref 12.0–46.0)
Lymphs Abs: 0.9 K/uL (ref 0.7–4.0)
MCHC: 33.1 g/dL (ref 30.0–36.0)
MCV: 87.4 fl (ref 78.0–100.0)
Monocytes Absolute: 0.7 K/uL (ref 0.1–1.0)
Monocytes Relative: 9 % (ref 3.0–12.0)
Neutro Abs: 6.1 K/uL (ref 1.4–7.7)
Neutrophils Relative %: 78.5 % — ABNORMAL HIGH (ref 43.0–77.0)
Platelets: 253 K/uL (ref 150.0–400.0)
RBC: 3.73 Mil/uL — ABNORMAL LOW (ref 4.22–5.81)
RDW: 15.1 % (ref 11.5–15.5)
WBC: 7.7 K/uL (ref 4.0–10.5)

## 2024-09-09 LAB — C-REACTIVE PROTEIN: CRP: 10 mg/dL (ref 0.5–20.0)

## 2024-09-09 LAB — BASIC METABOLIC PANEL WITH GFR
BUN: 20 mg/dL (ref 6–23)
CO2: 27 meq/L (ref 19–32)
Calcium: 8.1 mg/dL — ABNORMAL LOW (ref 8.4–10.5)
Chloride: 103 meq/L (ref 96–112)
Creatinine, Ser: 0.81 mg/dL (ref 0.40–1.50)
GFR: 88.3 mL/min (ref >60.00)
Glucose, Bld: 84 mg/dL (ref 70–99)
Potassium: 4.2 meq/L (ref 3.5–5.1)
Sodium: 137 meq/L (ref 135–145)

## 2024-09-09 LAB — MAGNESIUM: Magnesium: 1.8 mg/dL (ref 1.5–2.5)

## 2024-09-09 NOTE — Telephone Encounter (Signed)
 Post ED Visit - Positive Culture Follow-up  Culture report reviewed by antimicrobial stewardship pharmacist: [facility] []  Rankin Dee, Pharm.D. []  Venetia Gully, Pharm.D., BCPS AQ-ID []  Garrel Crews, Pharm.D., BCPS []  Almarie Lunger, Pharm.D., BCPS []  [place], [place].D., BCPS, AAHIVP []  Rosaline Bihari, Pharm.D., BCPS, AAHIVP []  Vernell Meier, PharmD, BCPS []  Latanya Hint, PharmD, BCPS []  Donald Medley, PharmD, BCPS [x]  Joesph Rocher, PharmD []  Dorothyann Alert, PharmD, BCPS []  Morene Babe, PharmD  [facility] []  Rosaline Edison, PharmD []  Romona Bliss, PharmD []  Dolphus Roller, PharmD []  Veva Seip, Rph []  Vernell Daunt) [name], PharmD []  Eva Allis, PharmD []  Rosaline Millet, PharmD []  Iantha Batch, PharmD []  Arvin Gauss, PharmD []  Wanda Hasting, PharmD []  Ronal Rav, PharmD []  Rocky Slade, PharmD []  Bard Jeans, PharmD   Positive urine culture Treated with Bactrim by PCP on 09/08/2024, organism sensitive to the same and no further patient follow-up is required at this time.  Donald Roberts Donald Roberts 09/09/2024, 3:51 PM

## 2024-09-09 NOTE — Patient Instructions (Signed)
 Medication Instructions:  Your physician recommends that you continue on your current medications as directed. Please refer to the Current Medication list given to you today.   *If you need a refill on your cardiac medications before your next appointment, please call your pharmacy*   Lab Work:   WILL TRY AND REQUEST BLOOD  WORK   TO ADD LIPID PANEL LAB  IF UNABLE TO  LAB WORK WILL GET DRAWN ON FOLLOW UP .   If you have labs (blood work) drawn today and your tests are completely normal, you will receive your results only by: MyChart Message (if you have MyChart) OR A paper copy in the mail If you have any lab test that is abnormal or we need to change your treatment, we will call you to review the results.   Testing/Procedures:  Your physician has requested that you have an echocardiogram. Echocardiography is a painless test that uses sound waves to create images of your heart. It provides your doctor with information about the size and shape of your heart and how well your heart's chambers and valves are working. This procedure takes approximately one hour. There are no restrictions for this procedure. Please do NOT wear cologne, perfume, aftershave, or lotions (deodorant is allowed). Please arrive 15 minutes prior to your appointment time.  Please note: We ask at that you not bring children with you during ultrasound (echo/ vascular) testing. Due to room size and safety concerns, children are not allowed in the ultrasound rooms during exams. Our front office staff cannot provide observation of children in our lobby area while testing is being conducted. An adult accompanying a patient to their appointment will only be allowed in the ultrasound room at the discretion of the ultrasound technician under special circumstances. We apologize for any inconvenience.      Follow-Up: At [facility], you and your health needs are our priority.  As part of our continuing mission to  provide you with exceptional heart care, our providers are all part of one team.  This team includes your primary Cardiologist (physician) and Advanced Practice Providers or APPs (Physician Assistants and Nurse Practitioners) who all work together to provide you with the care you need, when you need it.  Your next appointment:   6 month(s)  Provider:   Redell Shallow, MD    We recommend signing up for the patient portal called MyChart.  Sign up information is provided on this After Visit Summary.  MyChart is used to connect with patients for Virtual Visits (Telemedicine).  Patients are able to view lab/test results, encounter notes, upcoming appointments, etc.  Non-urgent messages can be sent to your provider as well.   To learn more about what you can do with MyChart, go to [email or web address].   Other Instructions  Low-Sodium Eating Plan Salt (sodium) helps you keep a healthy balance of fluids in your body. Too much sodium can raise your blood pressure. It can also cause fluid and waste to be held in your body. Your health care provider or dietitian may recommend a low-sodium eating plan if you have high blood pressure (hypertension), kidney disease, liver disease, or heart failure. Eating less sodium can help lower your blood pressure and reduce swelling. It can also protect your heart, liver, and kidneys. What are tips for following this plan? Reading food labels  Check food labels for the amount of sodium per serving. If you eat more than one serving, you must multiply the listed amount by the  number of servings. Choose foods with less than 140 milligrams (mg) of sodium per serving. Avoid foods with 300 mg of sodium or more per serving. Always check how much sodium is in a product, even if the label says unsalted or no salt added. Shopping  Buy products labeled as low-sodium or no salt added. Buy fresh foods. Avoid canned foods and pre-made or frozen meals. Avoid  canned, cured, or processed meats. Buy breads that have less than 80 mg of sodium per slice. Cooking  Eat more home-cooked food. Try to eat less restaurant, buffet, and fast food. Try not to add salt when you cook. Use salt-free seasonings or herbs instead of table salt or sea salt. Check with your provider or pharmacist before using salt substitutes. Cook with plant-based oils, such as canola, sunflower, or olive oil. Meal planning When eating at a restaurant, ask if your food can be made with less salt or no salt. Avoid dishes labeled as brined, pickled, cured, or smoked. Avoid dishes made with soy sauce, miso, or teriyaki sauce. Avoid foods that have monosodium glutamate (MSG) in them. MSG may be added to some restaurant food, sauces, soups, bouillon, and canned foods. Make meals that can be grilled, baked, poached, roasted, or steamed. These are often made with less sodium. General information Try to limit your sodium intake to 1,500-2,300 mg each day, or the amount told by your provider. What foods should I eat? Fruits Fresh, frozen, or canned fruit. Fruit juice. Vegetables Fresh or frozen vegetables. No salt added canned vegetables. No salt added tomato sauce and paste. Low-sodium or reduced-sodium tomato and vegetable juice. Grains Low-sodium cereals, such as oats, puffed wheat and rice, and shredded wheat. Low-sodium crackers. Unsalted rice. Unsalted pasta. Low-sodium bread. Whole grain breads and whole grain pasta. Meats and other proteins Fresh or frozen meat, poultry, seafood, and fish. These should have no added salt. Low-sodium canned tuna and salmon. Unsalted nuts. Dried peas, beans, and lentils without added salt. Unsalted canned beans. Eggs. Unsalted nut butters. Dairy Milk. Soy milk. Cheese that is naturally low in sodium, such as ricotta cheese, fresh mozzarella, or Swiss cheese. Low-sodium or reduced-sodium cheese. Cream cheese. Yogurt. Seasonings and condiments Fresh  and dried herbs and spices. Salt-free seasonings. Low-sodium mustard and ketchup. Sodium-free salad dressing. Sodium-free light mayonnaise. Fresh or refrigerated horseradish. Lemon juice. Vinegar. Other foods Homemade, reduced-sodium, or low-sodium soups. Unsalted popcorn and pretzels. Low-salt or salt-free chips. The items listed above may not be all the foods and drinks you can have. Talk to a dietitian to learn more. What foods should I avoid? Vegetables Sauerkraut, pickled vegetables, and relishes. Olives. [place] fries. Onion rings. Regular canned vegetables, except low-sodium or reduced-sodium items. Regular canned tomato sauce and paste. Regular tomato and vegetable juice. Frozen vegetables in sauces. Grains Instant hot cereals. Bread stuffing, pancake, and biscuit mixes. Croutons. Seasoned rice or pasta mixes. Noodle soup cups. Boxed or frozen macaroni and cheese. Regular salted crackers. Self-rising flour. Meats and other proteins Meat or fish that is salted, canned, smoked, spiced, or pickled. Precooked or cured meat, such as sausages or meat loaves. [place]. Ham. Pepperoni. Hot dogs. Corned beef. Chipped beef. Salt pork. Jerky. Pickled herring, anchovies, and sardines. Regular canned tuna. Salted nuts. Dairy Processed cheese and cheese spreads. Hard cheeses. Cheese curds. Blue cheese. Feta cheese. String cheese. Regular cottage cheese. Buttermilk. Canned milk. Fats and oils Salted butter. Regular margarine. Ghee. Bacon fat. Seasonings and condiments Onion salt, garlic salt, seasoned salt, table salt, and  sea salt. Canned and packaged gravies. Worcestershire sauce. Tartar sauce. Barbecue sauce. Teriyaki sauce. Soy sauce, including reduced-sodium soy sauce. Steak sauce. Fish sauce. Oyster sauce. Cocktail sauce. Horseradish that you find on the shelf. Regular ketchup and mustard. Meat flavorings and tenderizers. Bouillon cubes. Hot sauce. Pre-made or packaged marinades. Pre-made or packaged taco  seasonings. Relishes. Regular salad dressings. Salsa. Other foods Salted popcorn and pretzels. Corn chips and puffs. Potato and tortilla chips. Canned or dried soups. Pizza. Frozen entrees and pot pies. The items listed above may not be all the foods and drinks you should avoid. Talk to a dietitian to learn more. This information is not intended to replace advice given to you by your health care provider. Make sure you discuss any questions you have with your health care provider. Document Revised: 12/05/2022 Document Reviewed: 12/05/2022 Elsevier Patient Education  2024 [employer].

## 2024-09-11 LAB — CULTURE, BLOOD (ROUTINE X 2)
Culture: NO GROWTH
Culture: NO GROWTH
Special Requests: ADEQUATE
Special Requests: ADEQUATE

## 2024-10-07 ENCOUNTER — Encounter: Payer: Self-pay | Admitting: Family Medicine

## 2024-10-18 ENCOUNTER — Other Ambulatory Visit: Payer: Self-pay | Admitting: Family Medicine

## 2024-10-18 DIAGNOSIS — D649 Anemia, unspecified: Secondary | ICD-10-CM

## 2024-10-19 ENCOUNTER — Encounter: Payer: Self-pay | Admitting: *Deleted

## 2024-10-19 DIAGNOSIS — Z006 Encounter for examination for normal comparison and control in clinical research program: Secondary | ICD-10-CM

## 2024-10-19 NOTE — Research (Signed)
 Spoke with Donald Roberts about pre-event research study. He states he would like more information. Emailed him the consents to read over. Encouraged him to call or email with any questions.

## 2024-10-21 NOTE — Progress Notes (Signed)
 HPI: FU CAD.  Previously followed by Dr. Claudene. Cardiac catheterization December 2019 showed 25% proximal LAD. Abdominal ultrasound February 2021 showed greater than 50% stenosis in the left common iliac and external iliac arteries but no abdominal aortic aneurysm.  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 Carotid Dopplers October 2024 with low velocities on the right but shadowing and plaque noted with some narrowing, 1 to 39% left stenosis.  Cardiac catheterization September 2025 showed normal LV function, normal left ventricular end-diastolic pressure and nonobstructive coronary disease (no change from previous).  Echocardiogram November 2025 showed normal LV function.  Since last seen patient does have dyspnea on exertion.  No orthopnea, PND, pedal edema, chest pain or syncope.  Patient complains of significant fatigue.  Current Outpatient Medications  Medication Sig Dispense Refill   aspirin  EC 81 MG tablet Take 81 mg by mouth daily. Swallow whole.     clotrimazole -betamethasone  (LOTRISONE ) cream Apply 1 Application topically 2 (two) times daily.     nitroGLYCERIN  (NITROSTAT ) 0.4 MG SL tablet DISSOLVE 1 TABLET UNDER THE TONGUE EVERY 5 MINUTES FOR UP TO 3 DOSES AS NEEDED FOR CHEST PAIN. IF NO RELIEF AFTER 3 DOSES, CALL 911 OR GO TO ER. 25 tablet 2   oxyCODONE -acetaminophen  (PERCOCET) 10-325 MG tablet Take 1 tablet by mouth every 4 (four) hours as needed for pain.     pregabalin  (LYRICA ) 200 MG capsule Take 200 mg by mouth at bedtime.     rosuvastatin  (CRESTOR ) 40 MG tablet Take 1 tablet (40 mg total) by mouth daily. (Patient taking differently: Take 40 mg by mouth 3 (three) times a week.) 90 tablet 0   No current facility-administered medications for this visit.     Past Medical History:  Diagnosis Date   Abnormal gait    Allergic rhinitis, cause unspecified 07/18/2008   Back pain, chronic 10/01/2011   CAD (coronary artery disease)    Carpal tunnel syndrome on left     Chronic low back pain 12/25/2012   Degenerative arthritis of right knee 10/01/2011   Diverticulosis of colon (without mention of hemorrhage) 07/18/2008   Gross hematuria 07/18/2008   Headache(784.0)    Hx: of Migraines as a child   Hyperlipidemia 07/18/2008   Hypertension    Idiopathic progressive neuropathy    Personal history of colonic polyps 07/18/2008   PVD (peripheral vascular disease)    Tobacco use disorder 04/06/2009    Past Surgical History:  Procedure Laterality Date   ANTERIOR CERVICAL DECOMP/DISCECTOMY FUSION N/A 09/10/2013   Procedure: ANTERIOR CERVICAL DECOMPRESSION/DISCECTOMY FUSION CERVICAL FOUR-FIVE,CERVICAL FIVE-SIX WITH PLATING AND BONE GRAFT;  Surgeon: Catalina CHRISTELLA Stains, MD;  Location: [facility];  Service: Neurosurgery;  Laterality: N/A;   ANTERIOR CERVICAL DECOMP/DISCECTOMY FUSION N/A 03/14/2015   Procedure: ANTERIOR CERVICAL DECOMPRESSION/DISCECTOMY FUSION  CERVICAL TWO-THREE;  Surgeon: Catalina Stains, MD;  Location: [facility];  Service: Neurosurgery;  Laterality: N/A;   ANTERIOR CERVICAL DECOMP/DISCECTOMY FUSION N/A 10/03/2020   Procedure: Anterior Cervial Discectomy and Fusion Cervical three and cervical four;  Surgeon: Louis Shove, MD;  Location: [facility] OR;  Service: Neurosurgery;  Laterality: N/A;  3C   CARDIAC CATHETERIZATION     COLONOSCOPY     COLONOSCOPY W/ BIOPSIES AND POLYPECTOMY     Hx: of   LEFT HEART CATH AND CORONARY ANGIOGRAPHY N/A 11/04/2018   Procedure: LEFT HEART CATH AND CORONARY ANGIOGRAPHY;  Surgeon: Claudene Pacific, MD;  Location: [facility];  Service: Cardiovascular;  Laterality: N/A;   LEFT  HEART CATH AND CORONARY ANGIOGRAPHY N/A 01/15/2023   Procedure: LEFT HEART CATH AND CORONARY ANGIOGRAPHY;  Surgeon: Anner Alm ORN, MD;  Location: [facility] CV LAB;  Service: Cardiovascular;  Laterality: N/A;   LEFT HEART CATH AND CORONARY ANGIOGRAPHY N/A 08/25/2024   Procedure: LEFT HEART CATH AND CORONARY ANGIOGRAPHY;  Surgeon: Anner Alm ORN,  MD;  Location: [facility] INVASIVE CV LAB;  Service: Cardiovascular;  Laterality: N/A;   lumbar surgury     dr botero/ns; 3 level fusion per pt   POLYPECTOMY     s/p cervical disease x 2      Social History   Socioeconomic History   Marital status: Married    Spouse name: Erminio   Number of children: 1   Years of education: Not on file   Highest education level: High school graduate  Occupational History   Occupation: surveyor, minerals and midwife    Comment: semi retired  Tobacco Use   Smoking status: Former    Current packs/day: 0.50    Average packs/day: 0.5 packs/day for 40.0 years (20.0 ttl pk-yrs)    Types: Cigarettes   Smokeless tobacco: Never  Vaping Use   Vaping status: Never Used  Substance and Sexual Activity   Alcohol use: Yes    Alcohol/week: 2.0 standard drinks of alcohol    Types: 2 Cans of beer per week    Comment: occasional   Drug use: No   Sexual activity: Not Currently  Other Topics Concern   Not on file  Social History Narrative   Married, 1 daughter lives close by.   Spends time at [employer] in [place].   Social Drivers of Corporate Investment Banker Strain: Low Risk  (12/05/2023)   Overall Financial Resource Strain (CARDIA)    Difficulty of Paying Living Expenses: Not hard at all  Food Insecurity: Low Risk  (09/23/2024)   Received from [facility]   Hunger Vital Sign    Within the past 12 months, you worried that your food would run out before you got money to buy more: Never true    Within the past 12 months, the food you bought just didn't last and you didn't have money to get more. : Never true  Transportation Needs: No Transportation Needs (09/23/2024)   Received from [facility]    In the past 12 months, has lack of reliable transportation kept you from medical appointments, meetings, work or from getting things needed for daily living? : No  Physical Activity: Inactive (12/05/2023)   Exercise Vital Sign    Days of Exercise  per Week: 0 days    Minutes of Exercise per Session: 0 min  Stress: No Stress Concern Present (12/05/2023)   [employer] of Occupational Health - Occupational Stress Questionnaire    Feeling of Stress : Not at all  Social Connections: Socially Integrated (12/05/2023)   Social Connection and Isolation Panel    Frequency of Communication with Friends and Family: More than three times a week    Frequency of Social Gatherings with Friends and Family: More than three times a week    Attends Religious Services: More than 4 times per year    Active Member of [employer] or Organizations: Yes    Attends Banker Meetings: More than 4 times per year    Marital Status: Married  Catering Manager Violence: Not At Risk (12/05/2023)   Humiliation, Afraid, Rape, and Kick questionnaire    Fear of Current or Ex-Partner:  No    Emotionally Abused: No    Physically Abused: No    Sexually Abused: No    Family History  Problem Relation Age of Onset   Heart disease Mother    Heart attack Father    Heart disease Father    Cancer Sister    Diabetes Sister    Heart disease Sister    Heart disease Brother    Parkinsonism Paternal Uncle    Heart disease Other    Colon cancer Neg Hx    Colon polyps Neg Hx    Esophageal cancer Neg Hx    Rectal cancer Neg Hx    Stomach cancer Neg Hx     ROS: no fevers or chills, productive cough, hemoptysis, dysphasia, odynophagia, melena, hematochezia, dysuria, hematuria, rash, seizure activity, orthopnea, PND, pedal edema, claudication. Remaining systems are negative.  Physical Exam: Well-developed well-nourished in no acute distress.  Skin is warm and dry.  HEENT is normal.  Neck is supple.  Chest is clear to auscultation with normal expansion.  Cardiovascular exam is regular rate and rhythm.  Abdominal exam nontender or distended. No masses palpated. Extremities show no edema. neuro grossly intact   A/P  1 coronary artery disease-most recent cardiac  catheterization as outlined.  Continue medical therapy with aspirin  and statin.  2 hyperlipidemia-continue statin.  3 hypertension-patient's blood pressure is controlled.    4 peripheral vascular disease-continue aspirin  and statin.  Patient is not having claudication.  5 fatigue-etiology unclear.  Long discussion with patient and wife today.  I can find no etiology based on recent cardiac catheterization and echocardiogram.  Recent TSH normal.  Mildly anemic on recent laboratories.  I have asked him to follow-up with primary care for further evaluation.  He also describes dyspnea on exertion.  There was note of some degree of emphysema on previous CT scan.  May need pulmonary evaluation.  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  Aortic root not dilated on most recent echocardiogram.  Redell Shallow, MD

## 2024-10-22 ENCOUNTER — Ambulatory Visit
Admission: RE | Admit: 2024-10-22 | Discharge: 2024-10-22 | Disposition: A | Discharge status: Home / Self Care | Source: Ambulatory Visit | Attending: Family Medicine | Admitting: Family Medicine

## 2024-10-22 DIAGNOSIS — Z122 Encounter for screening for malignant neoplasm of respiratory organs: Secondary | ICD-10-CM | POA: Diagnosis present

## 2024-10-22 DIAGNOSIS — Z87891 Personal history of nicotine dependence: Secondary | ICD-10-CM | POA: Diagnosis present

## 2024-10-25 ENCOUNTER — Ambulatory Visit (HOSPITAL_COMMUNITY)
Admission: RE | Admit: 2024-10-25 | Discharge: 2024-10-25 | Disposition: A | Discharge status: Home / Self Care | Source: Ambulatory Visit | Attending: Cardiovascular Disease | Admitting: Cardiovascular Disease

## 2024-10-25 DIAGNOSIS — R5383 Other fatigue: Secondary | ICD-10-CM | POA: Diagnosis present

## 2024-10-25 DIAGNOSIS — R0789 Other chest pain: Secondary | ICD-10-CM | POA: Diagnosis not present

## 2024-10-25 DIAGNOSIS — F17210 Nicotine dependence, cigarettes, uncomplicated: Secondary | ICD-10-CM | POA: Diagnosis not present

## 2024-10-25 DIAGNOSIS — I10 Essential (primary) hypertension: Secondary | ICD-10-CM | POA: Insufficient documentation

## 2024-10-25 DIAGNOSIS — E785 Hyperlipidemia, unspecified: Secondary | ICD-10-CM | POA: Insufficient documentation

## 2024-10-25 DIAGNOSIS — I2510 Atherosclerotic heart disease of native coronary artery without angina pectoris: Secondary | ICD-10-CM | POA: Insufficient documentation

## 2024-10-25 LAB — ECHOCARDIOGRAM COMPLETE
Area-P 1/2: 3.36 cm2
S' Lateral: 3.1 cm

## 2024-10-26 ENCOUNTER — Ambulatory Visit: Payer: Self-pay | Admitting: Physician Assistant

## 2024-10-26 ENCOUNTER — Encounter: Payer: Self-pay | Admitting: *Deleted

## 2024-10-26 DIAGNOSIS — Z006 Encounter for examination for normal comparison and control in clinical research program: Secondary | ICD-10-CM

## 2024-10-26 NOTE — Research (Signed)
 Message left about pre-event research study. Encouraged [name] to cal me back.

## 2024-10-27 ENCOUNTER — Telehealth: Payer: Self-pay

## 2024-10-27 ENCOUNTER — Telehealth: Payer: Self-pay | Admitting: *Deleted

## 2024-10-27 DIAGNOSIS — R351 Nocturia: Secondary | ICD-10-CM

## 2024-10-27 NOTE — Addendum Note (Signed)
 Addended by: KATHRYNE MILLMAN B on: 10/27/2024 03:55 PM   Modules accepted: Orders

## 2024-10-27 NOTE — Telephone Encounter (Signed)
 Copied from CRM [id number]. Topic: Referral - Question >> Oct 27, 2024  2:23 PM Aisha D wrote: Reason for CRM: Pt's wife is requesting a referral for [employer]. Erminio would like for the referral to be sent as soon as possible so she can schedule the appt for the pt and would like a callback with an update.

## 2024-10-27 NOTE — Telephone Encounter (Signed)
 I do not see a clear indication to see nephrologist at this time, he does not have a history of CKD; So referral may not be approved by insurance or provider. If he insists, referral can be placed, diagnosis patient requested. Thanks, BJ

## 2024-10-27 NOTE — Telephone Encounter (Signed)
 Copied from CRM #[id number]. Topic: General - Other >> Oct 27, 2024  4:01 PM Franky GRADE wrote: Reason for CRM: Patient's spouse is asking for a call back from [facility]. Vernell had just gotten off the phone with the patient but the spouse wanted to provide a bit more information but did not want to disclose to me.  Spoke to the patient and the referral was placed.

## 2024-10-27 NOTE — Telephone Encounter (Signed)
 Referral placed per patient request

## 2024-11-01 ENCOUNTER — Other Ambulatory Visit: Payer: Self-pay | Admitting: Acute Care

## 2024-11-01 DIAGNOSIS — Z122 Encounter for screening for malignant neoplasm of respiratory organs: Secondary | ICD-10-CM

## 2024-11-01 DIAGNOSIS — Z87891 Personal history of nicotine dependence: Secondary | ICD-10-CM

## 2024-11-03 ENCOUNTER — Encounter: Payer: Self-pay | Admitting: Cardiology

## 2024-11-03 ENCOUNTER — Ambulatory Visit: Payer: Self-pay

## 2024-11-03 ENCOUNTER — Ambulatory Visit: Attending: Cardiology | Admitting: Cardiology

## 2024-11-03 VITALS — BP 102/70 | HR 82 | Ht 70.0 in | Wt 165.0 lb

## 2024-11-03 DIAGNOSIS — I10 Essential (primary) hypertension: Secondary | ICD-10-CM

## 2024-11-03 DIAGNOSIS — I739 Peripheral vascular disease, unspecified: Secondary | ICD-10-CM | POA: Diagnosis not present

## 2024-11-03 DIAGNOSIS — E7800 Pure hypercholesterolemia, unspecified: Secondary | ICD-10-CM

## 2024-11-03 DIAGNOSIS — I2510 Atherosclerotic heart disease of native coronary artery without angina pectoris: Secondary | ICD-10-CM

## 2024-11-03 DIAGNOSIS — R5383 Other fatigue: Secondary | ICD-10-CM | POA: Diagnosis not present

## 2024-11-03 NOTE — Telephone Encounter (Signed)
 FYI Only or Action Required?: FYI only for provider: appointment scheduled on 12/8.  Patient was last seen in primary care on 09/08/2024 by Jordan, Betty G, MD.  Called Nurse Triage reporting Fatigue.   Triage Disposition: Information or Advice Only Call  Patient/caregiver understands and will follow disposition?: Yes          Copied from CRM #[id number]. Topic: Clinical - Red Word Triage >> Nov 03, 2024  9:33 AM Dedra NOVAK wrote: [facility] that prompted transfer to Nurse Triage: Pt is experiencing worsening chronic fatigue. Warm transfer to NT. Reason for Disposition  Health information question, no triage required and triager able to answer question  Answer Assessment - Initial Assessment Questions 1. REASON FOR CALL: What is the main reason for your call? or How can I best help you?    Returned patient call to discuss symptoms of fatigue. RN was advised by patient that he scheduled an appointment for 12/8 with PCP. No triage completed. Patient agrees with plan of care, and will call back if anything changes, or if symptoms worsen.  Protocols used: Information Only Call - No Triage-A-AH

## 2024-11-03 NOTE — Telephone Encounter (Signed)
 Pt disconnected prior to transfer to NT. VM left requesting call back.  Copied from CRM #[id number]. Topic: Clinical - Red Word Triage >> Nov 03, 2024  9:33 AM Dedra NOVAK wrote: [facility] that prompted transfer to Nurse Triage: Pt is experiencing worsening chronic fatigue. Warm transfer to NT.

## 2024-11-03 NOTE — Patient Instructions (Signed)

## 2024-11-08 ENCOUNTER — Encounter: Payer: Self-pay | Admitting: Family Medicine

## 2024-11-08 ENCOUNTER — Ambulatory Visit: Admitting: Family Medicine

## 2024-11-08 VITALS — BP 112/64 | HR 94 | Temp 98.1°F | Resp 16 | Ht 70.0 in | Wt 165.0 lb

## 2024-11-08 DIAGNOSIS — D649 Anemia, unspecified: Secondary | ICD-10-CM

## 2024-11-08 DIAGNOSIS — R5382 Chronic fatigue, unspecified: Secondary | ICD-10-CM

## 2024-11-08 DIAGNOSIS — R0609 Other forms of dyspnea: Secondary | ICD-10-CM

## 2024-11-08 DIAGNOSIS — R7982 Elevated C-reactive protein (CRP): Secondary | ICD-10-CM

## 2024-11-08 LAB — C-REACTIVE PROTEIN: CRP: <0.5 mg/dL (ref 0.5–20.0)

## 2024-11-08 LAB — CBC
HCT: 39.9 % (ref 39.0–52.0)
Hemoglobin: 13.2 g/dL (ref 13.0–17.0)
MCHC: 33.2 g/dL (ref 30.0–36.0)
MCV: 86.7 fl (ref 78.0–100.0)
Platelets: 230 K/uL (ref 150.0–400.0)
RBC: 4.6 Mil/uL (ref 4.22–5.81)
RDW: 14.9 % (ref 11.5–15.5)
WBC: 8.2 K/uL (ref 4.0–10.5)

## 2024-11-08 LAB — HEPATIC FUNCTION PANEL
ALT: 11 U/L (ref 0–53)
AST: 16 U/L (ref 0–37)
Albumin: 4 g/dL (ref 3.5–5.2)
Alkaline Phosphatase: 48 U/L (ref 39–117)
Bilirubin, Direct: 0.1 mg/dL (ref 0.0–0.3)
Total Bilirubin: 0.4 mg/dL (ref 0.2–1.2)
Total Protein: 7.3 g/dL (ref 6.0–8.3)

## 2024-11-08 LAB — FERRITIN: Ferritin: 19.8 ng/mL — ABNORMAL LOW (ref 22.0–322.0)

## 2024-11-08 LAB — IRON: Iron: 109 ug/dL (ref 42–165)

## 2024-11-08 NOTE — Progress Notes (Unsigned)
 Chief Complaint  Patient presents with   Fatigue    Fatigue has been going on for 2-3 years now. States heart Dr said nothing is wrong with his heart. Asking for labs to be drawn. States it is every day.    HPI: [name] is a 73 y.o. male, who is here today complaining of *** He was last seen on 09/08/2024 for hospital follow-up. Since his last visit he has seen cardiologist and neurologist. I saw her for the first time on 07/11/2023, at that time he was complaining of chronic fatigue. *** Discussed the use of AI scribe software for clinical note transcription with the patient, who gave verbal consent to proceed.  History of Present Illness Donald Roberts is a 73 year old male who presents with persistent fatigue and shortness of breath.  He has experienced fatigue for several years, which has progressively worsened. He reports poor sleep, as he frequently wakes up to urinate every 30 to 40 minutes. He reports consulting three urologists and undergoing various tests for his bladder issues. He does not fall asleep during conversations or while driving but does fall asleep at home on the sofa or while watching TV. In October, he was hospitalized after being unresponsive for 11 hours, though the cause was not determined.  He has undergone extensive cardiac evaluation, including a catheterization, and was informed that his heart is functioning well. He has a history of anemia, with low RBC and hemoglobin levels noted during a previous hospitalization. His hemoglobin levels have fluctuated, with recent values ranging from 10.5 to 11.6. No new symptoms such as fever, chills, changes in appetite, or abnormal weight loss. No cough, wheeze, difficulty breathing, chest pain, abdominal pain, nausea, or vomiting. He recalls a past urinary tract infection and an episode of vomiting a substance resembling 'ground coffee,' which led to hospitalization.  He reports being out of breath when moving from  the sofa to the bathroom, which is approximately 15 steps. He has not noticed wheezing or coughing, and his lung cancer screening was unremarkable. He has a history of emphysema and states his symptoms are the same as they have been for years.  His family history includes his father, who recently passed away from liver cirrhosis. He is concerned about his liver health, as a previous test showed mildly elevated total bilirubin, though other liver enzymes were normal. He has not had an endoscopy to investigate potential gastrointestinal bleeding, which could explain his anemia.  Lab Results  Component Value Date   TSH 0.458 09/06/2024   Lab Results  Component Value Date   ALT 13 09/06/2024   AST 24 09/06/2024   ALKPHOS 51 09/06/2024   BILITOT 1.3 (H) 09/06/2024   Lab Results  Component Value Date   NA 137 09/08/2024   CL 103 09/08/2024   K 4.2 09/08/2024   CO2 27 09/08/2024   BUN 20 09/08/2024   CREATININE 0.81 09/08/2024   GFR 88.30 09/08/2024   CALCIUM  8.1 (L) 09/08/2024   ALBUMIN 2.6 (L) 09/06/2024   GLUCOSE 84 09/08/2024    Review of Systems See other pertinent positives and negatives in HPI.  Current Outpatient Medications on File Prior to Visit  Medication Sig Dispense Refill   aspirin  EC 81 MG tablet Take 81 mg by mouth daily. Swallow whole.     clotrimazole -betamethasone  (LOTRISONE ) cream Apply 1 Application topically 2 (two) times daily.     nitroGLYCERIN  (NITROSTAT ) 0.4 MG SL tablet DISSOLVE 1 TABLET UNDER THE  TONGUE EVERY 5 MINUTES FOR UP TO 3 DOSES AS NEEDED FOR CHEST PAIN. IF NO RELIEF AFTER 3 DOSES, CALL 911 OR GO TO ER. 25 tablet 2   oxyCODONE -acetaminophen  (PERCOCET) 10-325 MG tablet Take 1 tablet by mouth every 4 (four) hours as needed for pain.     pregabalin  (LYRICA ) 200 MG capsule Take 200 mg by mouth at bedtime.     rosuvastatin  (CRESTOR ) 40 MG tablet Take 1 tablet (40 mg total) by mouth daily. (Patient taking differently: Take 40 mg by mouth 3 (three) times  a week.) 90 tablet 0   No current facility-administered medications on file prior to visit.    Past Medical History:  Diagnosis Date   Abnormal gait    Allergic rhinitis, cause unspecified 07/18/2008   Back pain, chronic 10/01/2011   CAD (coronary artery disease)    Carpal tunnel syndrome on left    Chronic low back pain 12/25/2012   Degenerative arthritis of right knee 10/01/2011   Diverticulosis of colon (without mention of hemorrhage) 07/18/2008   Gross hematuria 07/18/2008   Headache(784.0)    Hx: of Migraines as a child   Hyperlipidemia 07/18/2008   Hypertension    Idiopathic progressive neuropathy    Personal history of colonic polyps 07/18/2008   PVD (peripheral vascular disease)    Tobacco use disorder 04/06/2009   No Known Allergies  Social History   Socioeconomic History   Marital status: Married    Spouse name: Erminio   Number of children: 1   Years of education: Not on file   Highest education level: High school graduate  Occupational History   Occupation: surveyor, minerals and midwife    Comment: semi retired  Tobacco Use   Smoking status: Former    Current packs/day: 0.50    Average packs/day: 0.5 packs/day for 40.0 years (20.0 ttl pk-yrs)    Types: Cigarettes   Smokeless tobacco: Never  Vaping Use   Vaping status: Never Used  Substance and Sexual Activity   Alcohol use: Yes    Alcohol/week: 2.0 standard drinks of alcohol    Types: 2 Cans of beer per week    Comment: occasional   Drug use: No   Sexual activity: Not Currently  Other Topics Concern   Not on file  Social History Narrative   Married, 1 daughter lives close by.   Spends time at [employer] in [place].   Social Drivers of Corporate Investment Banker Strain: Low Risk  (12/05/2023)   Overall Financial Resource Strain (CARDIA)    Difficulty of Paying Living Expenses: Not hard at all  Food Insecurity: Low Risk (09/23/2024)   Received from [facility]   Hunger Vital Sign    Within  the past 12 months, you worried that your food would run out before you got money to buy more: Never true    Within the past 12 months, the food you bought just didn't last and you didn't have money to get more. : Never true  Transportation Needs: No Transportation Needs (09/23/2024)   Received from [facility]    In the past 12 months, has lack of reliable transportation kept you from medical appointments, meetings, work or from getting things needed for daily living? : No  Physical Activity: Inactive (12/05/2023)   Exercise Vital Sign    Days of Exercise per Week: 0 days    Minutes of Exercise per Session: 0 min  Stress: No Stress Concern Present (12/05/2023)  [employer] of Occupational Health - Occupational Stress Questionnaire    Feeling of Stress : Not at all  Social Connections: Socially Integrated (12/05/2023)   Social Connection and Isolation Panel    Frequency of Communication with Friends and Family: More than three times a week    Frequency of Social Gatherings with Friends and Family: More than three times a week    Attends Religious Services: More than 4 times per year    Active Member of [employer] or Organizations: Yes    Attends Banker Meetings: More than 4 times per year    Marital Status: Married   Vitals:   11/08/24 1033  BP: 112/64  Pulse: 94  Resp: 16  Temp: 98.1 F (36.7 C)  SpO2: 97%   Body mass index is 23.68 kg/m.  Physical Exam Vitals and nursing note reviewed.  Constitutional:      General: He is not in acute distress.    Appearance: He is well-developed.  HENT:     Head: Normocephalic and atraumatic.  Eyes:     Conjunctiva/sclera: Conjunctivae normal.  Cardiovascular:     Rate and Rhythm: Normal rate and regular rhythm.     Pulses:          Dorsalis pedis pulses are 2+ on the right side and 2+ on the left side.     Heart sounds: No murmur heard. Pulmonary:     Effort: Pulmonary effort is normal. No respiratory  distress.     Breath sounds: Normal breath sounds.  Abdominal:     Palpations: Abdomen is soft. There is no hepatomegaly or mass.     Tenderness: There is no abdominal tenderness.  Lymphadenopathy:     Cervical: No cervical adenopathy.  Skin:    General: Skin is warm.     Findings: No erythema or rash.  Neurological:     Mental Status: He is alert and oriented to person, place, and time.     Cranial Nerves: No cranial nerve deficit.     Comments: Mildly unstable gait, not assisted.  Psychiatric:        Mood and Affect: Mood and affect normal.    ASSESSMENT AND PLAN:  Mahari was seen today for fatigue.  Diagnoses and all orders for this visit:  Elevated C-reactive protein (CRP) -     C-reactive protein; Future  DOE (dyspnea on exertion) -     Ambulatory referral to Pulmonology  Chronic fatigue -     Ambulatory referral to Pulmonology -     Hepatic function panel; Future  Normocytic anemia    Orders Placed This Encounter  Procedures   C-reactive protein   Hepatic function panel   Ambulatory referral to Pulmonology    No problem-specific Assessment & Plan notes found for this encounter.   Return if symptoms worsen or fail to improve, for Depending of lab results..  Braylan Faul G. Rhen Dossantos, MD  [facility]. [place] office.

## 2024-11-08 NOTE — Patient Instructions (Addendum)
 A few things to remember from today's visit:  Elevated C-reactive protein (CRP) - Plan: C-reactive protein  DOE (dyspnea on exertion) - Plan: Ambulatory referral to Pulmonology  Chronic fatigue - Plan: Ambulatory referral to Pulmonology, Hepatic function panel  Further recommendations according to iron studies. Appt with pulmonologist will be arranged.  If you need refills for medications you take chronically, please call your pharmacy. Do not use My Chart to request refills or for acute issues that need immediate attention. If you send a my chart message, it may take a few days to be addressed, specially if I am not in the office.  Please be sure medication list is accurate. If a new problem present, please set up appointment sooner than planned today.

## 2024-11-09 LAB — BASIC METABOLIC PANEL WITH GFR
BUN: 19 (ref 4–21)
CO2: 28 — AB (ref 13–22)
Chloride: 102 (ref 99–108)
Creatinine: 1 (ref 0.6–1.3)
Glucose: 86
Potassium: 4.3 meq/L (ref 3.5–5.1)
Sodium: 140 (ref 137–147)

## 2024-11-09 LAB — IRON,TIBC AND FERRITIN PANEL
Iron: 110
TIBC: 313
UIBC: 203

## 2024-11-09 LAB — COMPREHENSIVE METABOLIC PANEL WITH GFR
Albumin: 4.3 (ref 3.5–5.0)
Calcium: 9.6 (ref 8.7–10.7)
eGFR: 85

## 2024-11-10 ENCOUNTER — Ambulatory Visit: Payer: Self-pay | Admitting: Family Medicine

## 2024-11-10 NOTE — Telephone Encounter (Signed)
 Patient was informed to not take the iron tablets because he is going to start doing iron infusions for 3 weeks. Patient did not have any further questions.

## 2024-11-10 NOTE — Telephone Encounter (Signed)
 Called patient informed him of his lab results, patient voiced understanding. Patient stated he recently received a call about receiving iron infusions x 3 weeks. Patient wants to verify if he should still take the Ferrous Sulfate 325 every other day with Vit C while doing the iron infusions, Please Advise?

## 2024-11-11 NOTE — Assessment & Plan Note (Signed)
 Problem has been going on for years. Possible etiologies discussed. I do not think this is caused by anemia but it can certainly aggravate problem. Poor sleep due to nocturia and some of his chronic health problems can also be contributing factors. Recommend sleep study to evaluate for sleep apnea.

## 2024-11-12 ENCOUNTER — Encounter: Payer: Self-pay | Admitting: Family Medicine

## 2024-12-08 ENCOUNTER — Encounter: Payer: Self-pay | Admitting: Family Medicine

## 2024-12-08 ENCOUNTER — Ambulatory Visit (INDEPENDENT_AMBULATORY_CARE_PROVIDER_SITE_OTHER): Admitting: Family Medicine

## 2024-12-08 ENCOUNTER — Ambulatory Visit: Payer: Medicare HMO

## 2024-12-08 VITALS — BP 110/78 | HR 73 | Temp 97.9°F | Ht 69.0 in | Wt 168.0 lb

## 2024-12-08 VITALS — BP 110/78 | HR 73 | Temp 97.9°F | Resp 16 | Ht 69.0 in | Wt 168.0 lb

## 2024-12-08 DIAGNOSIS — F5104 Psychophysiologic insomnia: Secondary | ICD-10-CM | POA: Diagnosis not present

## 2024-12-08 DIAGNOSIS — R5382 Chronic fatigue, unspecified: Secondary | ICD-10-CM

## 2024-12-08 DIAGNOSIS — R0609 Other forms of dyspnea: Secondary | ICD-10-CM

## 2024-12-08 DIAGNOSIS — Z0000 Encounter for general adult medical examination without abnormal findings: Secondary | ICD-10-CM

## 2024-12-08 MED ORDER — ZOLPIDEM TARTRATE 10 MG PO TABS: 5.0000 mg | ORAL_TABLET | Freq: Every evening | ORAL | 0 refills | Status: AC | PRN

## 2024-12-08 NOTE — Patient Instructions (Addendum)
 A few things to remember from today's visit:  Chronic fatigue  Chronic insomnia  DOE (dyspnea on exertion)  Try Ambien  1/2-1 tab at bedtime, condom cath and see if fatigue improves. Please contact pulmonologist's office.  If you need refills for medications you take chronically, please call your pharmacy. Do not use My Chart to request refills or for acute issues that need immediate attention. If you send a my chart message, it may take a few days to be addressed, specially if I am not in the office.  Please be sure medication list is accurate. If a new problem present, please set up appointment sooner than planned today. Information for Referral #: [id number]   Diagnoses:   R06.09 (ICD-10-CM) - DOE (dyspnea on exertion)   R53.82 (ICD-10-CM) - Chronic fatigue   Procedures: MZQ05 (Custom) - AMB REFERRAL TO PULMONOLOGY Authorization #:     Referring Provider Information Shamus Desantis G Dacian Orrico [address] [place] [address]  [phone] Referring To Provider Information [facility] [address] [address] [place] [address] [phone]  Referral Start Date: 11/08/2024 Referral End Date: 11/08/2025       .

## 2024-12-08 NOTE — Progress Notes (Addendum)
 "  Chief Complaint  Patient presents with   Medicare Wellness     Subjective:   Donald Roberts is a 74 y.o. male who presents for a Medicare Annual Wellness Visit.  Visit info / Clinical Intake: Medicare Wellness Visit Type:: Subsequent Annual Wellness Visit Persons participating in visit and providing information:: patient Medicare Wellness Visit Mode:: In-person (required for WTM) Interpreter Needed?: No Pre-visit prep was completed: yes AWV questionnaire completed by patient prior to visit?: yes Date:: 12/06/24 Living arrangements:: lives with spouse/significant other Patient's Overall Health Status Rating: good Typical amount of pain: (!) a lot (Followed by medical attention) Does pain affect daily life?: (!) yes (Followed by medical attention) Are you currently prescribed opioids?: (!) yes  Dietary Habits and Nutritional Risks How many meals a day?: 2 Eats fruit and vegetables daily?: yes Most meals are obtained by: preparing own meals In the last 2 weeks, have you had any of the following?: none Diabetic:: no  Functional Status Activities of Daily Living (to include ambulation/medication): Independent Ambulation: Independent with device- listed below Home Assistive Devices/Equipment: Eyeglasses; Dentures (specify type) (Denture Implants) Medication Administration: Independent Home Management (perform basic housework or laundry): Independent Manage your own finances?: yes Primary transportation is: driving; family / friends Concerns about vision?: no *vision screening is required for WTM* Concerns about hearing?: no  Fall Screening Falls in the past year?: 1 Number of falls in past year: 0 Was there an injury with Fall?: 0 Fall Risk Category Calculator: 1 Patient Fall Risk Level: Low Fall Risk  Fall Risk Patient at Risk for Falls Due to: Mental status change Fall risk Follow up: Falls evaluation completed; Falls prevention discussed; Education provided  Home  and Transportation Safety: All rugs have non-skid backing?: yes All stairs or steps have railings?: yes Grab bars in the bathtub or shower?: (!) no Have non-skid surface in bathtub or shower?: yes Good home lighting?: yes Regular seat belt use?: (!) no Hospital stays in the last year:: (!) yes How many hospital stays:: 1 Reason: Evaluation  Cognitive Assessment Difficulty concentrating, remembering, or making decisions? : no Will 6CIT or Mini Cog be Completed: yes What year is it?: 0 points What month is it?: 0 points Give patient an address phrase to remember (5 components): [address] [place]  About what time is it?: 0 points Count backwards from 20 to 1: 0 points Say the months of the year in reverse: 0 points Repeat the address phrase from earlier: 0 points 6 CIT Score: 0 points  Advance Directives (For Healthcare) Does Patient Have a Medical Advance Directive?: No Would patient like information on creating a medical advance directive?: No - Patient declined  Reviewed/Updated  Reviewed/Updated: Reviewed All (Medical, Surgical, Family, Medications, Allergies, Care Teams, Patient Goals)    Allergies (verified) Patient has no known allergies.   Current Medications (verified) Outpatient Encounter Medications as of 12/08/2024  Medication Sig   aspirin  EC 81 MG tablet Take 81 mg by mouth daily. Swallow whole.   clotrimazole -betamethasone  (LOTRISONE ) cream Apply 1 Application topically 2 (two) times daily.   nitroGLYCERIN  (NITROSTAT ) 0.4 MG SL tablet DISSOLVE 1 TABLET UNDER THE TONGUE EVERY 5 MINUTES FOR UP TO 3 DOSES AS NEEDED FOR CHEST PAIN. IF NO RELIEF AFTER 3 DOSES, CALL 911 OR GO TO ER.   oxyCODONE -acetaminophen  (PERCOCET) 10-325 MG tablet Take 1 tablet by mouth every 4 (four) hours as needed for pain.   pregabalin  (LYRICA ) 200 MG capsule Take 200 mg by mouth at bedtime.  rosuvastatin  (CRESTOR ) 40 MG tablet Take 1 tablet (40 mg total) by mouth daily.   zolpidem   (AMBIEN ) 10 MG tablet Take 0.5-1 tablets (5-10 mg total) by mouth at bedtime as needed for sleep.   No facility-administered encounter medications on file as of 12/08/2024.    History: Past Medical History:  Diagnosis Date   Abnormal gait    Allergic rhinitis, cause unspecified 07/18/2008   Back pain, chronic 10/01/2011   CAD (coronary artery disease)    Carpal tunnel syndrome on left    Chronic low back pain 12/25/2012   Degenerative arthritis of right knee 10/01/2011   Diverticulosis of colon (without mention of hemorrhage) 07/18/2008   Gross hematuria 07/18/2008   Headache(784.0)    Hx: of Migraines as a child   Hyperlipidemia 07/18/2008   Hypertension    Idiopathic progressive neuropathy    Personal history of colonic polyps 07/18/2008   PVD (peripheral vascular disease)    Tobacco use disorder 04/06/2009   Past Surgical History:  Procedure Laterality Date   ANTERIOR CERVICAL DECOMP/DISCECTOMY FUSION N/A 09/10/2013   Procedure: ANTERIOR CERVICAL DECOMPRESSION/DISCECTOMY FUSION CERVICAL FOUR-FIVE,CERVICAL FIVE-SIX WITH PLATING AND BONE GRAFT;  Surgeon: Catalina CHRISTELLA Stains, MD;  Location: [facility];  Service: Neurosurgery;  Laterality: N/A;   ANTERIOR CERVICAL DECOMP/DISCECTOMY FUSION N/A 03/14/2015   Procedure: ANTERIOR CERVICAL DECOMPRESSION/DISCECTOMY FUSION  CERVICAL TWO-THREE;  Surgeon: Catalina Stains, MD;  Location: [facility];  Service: Neurosurgery;  Laterality: N/A;   ANTERIOR CERVICAL DECOMP/DISCECTOMY FUSION N/A 10/03/2020   Procedure: Anterior Cervial Discectomy and Fusion Cervical three and cervical four;  Surgeon: Louis Shove, MD;  Location: [facility] OR;  Service: Neurosurgery;  Laterality: N/A;  3C   CARDIAC CATHETERIZATION     COLONOSCOPY     COLONOSCOPY W/ BIOPSIES AND POLYPECTOMY     Hx: of   LEFT HEART CATH AND CORONARY ANGIOGRAPHY N/A 11/04/2018   Procedure: LEFT HEART CATH AND CORONARY ANGIOGRAPHY;  Surgeon: Claudene Pacific, MD;  Location: [facility];   Service: Cardiovascular;  Laterality: N/A;   LEFT HEART CATH AND CORONARY ANGIOGRAPHY N/A 01/15/2023   Procedure: LEFT HEART CATH AND CORONARY ANGIOGRAPHY;  Surgeon: Anner Alm ORN, MD;  Location: [facility] INVASIVE CV LAB;  Service: Cardiovascular;  Laterality: N/A;   LEFT HEART CATH AND CORONARY ANGIOGRAPHY N/A 08/25/2024   Procedure: LEFT HEART CATH AND CORONARY ANGIOGRAPHY;  Surgeon: Anner Alm ORN, MD;  Location: [facility] IN[facility] CV [facility];  Service: Cardiovascular;  Laterality: N/A;   lumbar surgury     dr botero/ns; 3 level fusion per pt   POLYPECTOMY     s/p cervical disease x 2     Family History  Problem Relation Age of Onset   Heart disease Mother    Heart attack Father    Heart disease Father    Cancer Sister    Diabetes Sister    Heart disease Sister    Heart disease Brother    Parkinsonism Paternal Uncle    Heart disease Other    Colon cancer Neg Hx    Colon polyps Neg Hx    Esophageal cancer Neg Hx    Rectal cancer Neg Hx    Stomach cancer Neg Hx    Social History   Occupational History   Occupation: surveyor, minerals and midwife    Comment: semi retired  Tobacco Use   Smoking status: Former    Current packs/day: 0.50    Average packs/day: 0.5 packs/day for 40.0 years (20.0 ttl pk-yrs)  Types: Cigarettes   Smokeless tobacco: Never  Vaping Use   Vaping status: Never Used  Substance and Sexual Activity   Alcohol use: Yes    Alcohol/week: 2.0 standard drinks of alcohol    Types: 2 Cans of beer per week    Comment: occasional   Drug use: No   Sexual activity: Not Currently   Tobacco Counseling Counseling given: No  SDOH Screenings   Food Insecurity: No Food Insecurity (12/08/2024)  Housing: Low Risk (12/08/2024)  Transportation Needs: No Transportation Needs (12/08/2024)  Utilities: Not At Risk (12/08/2024)  Alcohol Screen: Low Risk (12/05/2023)  Depression (PHQ2-9): Low Risk (12/08/2024)  Financial Resource Strain: Low Risk (12/06/2024)  Physical Activity: Inactive  (12/08/2024)  Social Connections: Moderately Integrated (12/08/2024)  Stress: No Stress Concern Present (12/08/2024)  Tobacco Use: Medium Risk (12/08/2024)  Health Literacy: Adequate Health Literacy (12/08/2024)   See flowsheets for full screening details  Depression Screen PHQ 2 & 9 Depression Scale- Over the past 2 weeks, how often have you been bothered by any of the following problems? Little interest or pleasure in doing things: 0 Feeling down, depressed, or hopeless (PHQ Adolescent also includes...irritable): 0 PHQ-2 Total Score: 0     Goals Addressed               This Visit's Progress     Remain active (pt-stated)               Objective:    Today's Vitals   12/08/24 1523  BP: 110/78  Pulse: 73  Temp: 97.9 F (36.6 C)  TempSrc: Oral  SpO2: 93%  Weight: 168 lb (76.2 kg)  Height: 5' 9 (1.753 m)   Body mass index is 24.81 kg/m.  Hearing/Vision screen Hearing Screening - Comments:: Denies hearing difficulties   Vision Screening - Comments:: Wears reading glasses - Not up to date with routine eye exams. Patient deferred   Immunizations and Health Maintenance Health Maintenance  Topic Date Due   COVID-19 Vaccine (3 - Pfizer risk series) 10/04/2020   Zoster Vaccines- Shingrix (1 of 2) 02/06/2025 (Originally 07/11/1970)   Pneumococcal Vaccine: 50+ Years (1 of 2 - PCV) 11/08/2025 (Originally 07/11/1970)   Influenza Vaccine  12/30/2025 (Originally 07/02/2024)   Medicare Annual Wellness (AWV)  12/08/2025   DTaP/Tdap/Td (2 - Tdap) 02/11/2028   Colonoscopy  10/02/2028   Hepatitis C Screening  Completed   Meningococcal B Vaccine  Aged Out   Lung Cancer Screening  Discontinued        Assessment/Plan:  This is a routine wellness examination for [place].  Patient Care Team: Jordan, Betty G, MD as PCP - General (Family Medicine) Pietro Redell RAMAN, MD as PCP - Cardiology (Cardiology) Mindi Mt, MD (Inactive) as Consulting Physician (Anesthesiology) Leeann Hover, MD  as Consulting Physician (Neurosurgery) Jerrye Lamar CHRISTELLA Mickey., MD as Referring Physician (Pain Medicine)  I have personally reviewed and noted the following in the patients chart:   Medical and social history Use of alcohol, tobacco or illicit drugs  Current medications and supplements including opioid prescriptions. Functional ability and status Nutritional status Physical activity Advanced directives List of other physicians Hospitalizations, surgeries, and ER visits in previous 12 months Vitals Screenings to include cognitive, depression, and falls Referrals and appointments  No orders of the defined types were placed in this encounter.  In addition, I have reviewed and discussed with patient certain preventive protocols, quality metrics, and best practice recommendations. A written personalized care plan for preventive services as well as general  preventive health recommendations were provided to patient.   Rojelio LELON Blush, LPN   07/03/7972   Return in 53 weeks (on 12/14/2025).  After Visit Summary: (In Person-Printed) AVS printed and given to the patient  Nurse Notes: No voiced or noted concerns at this time "

## 2024-12-08 NOTE — Patient Instructions (Addendum)
 Mr. Schwandt,  Thank you for taking the time for your Medicare Wellness Visit. I appreciate your continued commitment to your health goals. Please review the care plan we discussed, and feel free to reach out if I can assist you further.  Please note that Annual Wellness Visits do not include a physical exam. Some assessments may be limited, especially if the visit was conducted virtually. If needed, we may recommend an in-person follow-up with your provider.  Ongoing Care Seeing your primary care provider every 3 to 6 months helps us  monitor your health and provide consistent, personalized care.   Referrals If a referral was made during today's visit and you haven't received any updates within two weeks, please contact the referred provider directly to check on the status.  Recommended Screenings:  Health Maintenance  Topic Date Due   COVID-19 Vaccine (3 - Pfizer risk series) 10/04/2020   Zoster (Shingles) Vaccine (1 of 2) 02/06/2025*   Pneumococcal Vaccine for age over 34 (1 of 2 - PCV) 11/08/2025*   Flu Shot  12/30/2025*   Medicare Annual Wellness Visit  12/08/2025   DTaP/Tdap/Td vaccine (2 - Tdap) 02/11/2028   Colon Cancer Screening  10/02/2028   Hepatitis C Screening  Completed   Meningitis B Vaccine  Aged Out   Screening for Lung Cancer  Discontinued  *Topic was postponed. The date shown is not the original due date.   Opioid Pain Medicine Management Opioids are powerful medicines that are used to treat moderate to severe pain. When used for short periods of time, they can help you to: Sleep better. Do better in physical or occupational therapy. Feel better in the first few days after an injury. Recover from surgery. Opioids should be taken with the supervision of a trained health care provider. They should be taken for the shortest period of time possible. This is because opioids can be addictive, and the longer you take opioids, the greater your risk of addiction. This  addiction can also be called opioid use disorder. What are the risks? Using opioid pain medicines for longer than 3 days increases your risk of side effects. Side effects include: Constipation. Nausea and vomiting. Breathing difficulties (respiratory depression). Drowsiness. Confusion. Opioid use disorder. Itching. Taking opioid pain medicine for a long period of time can affect your ability to do daily tasks. It also puts you at risk for: Motor vehicle crashes. Depression. Suicide. Heart attack. Overdose, which can be life-threatening. What is a pain treatment plan? A pain treatment plan is an agreement between you and your health care provider. Pain is unique to each person, and treatments vary depending on your condition. To manage your pain, you and your health care provider need to work together. To help you do this: Discuss the goals of your treatment, including how much pain you might expect to have and how you will manage the pain. Review the risks and benefits of taking opioid medicines. Remember that a good treatment plan uses more than one approach and minimizes the chance of side effects. Be honest about the amount of medicines you take and about any drug or alcohol use. Get pain medicine prescriptions from only one health care provider. Pain can be managed with many types of alternative treatments. Ask your health care provider to refer you to one or more specialists who can help you manage pain through: Physical or occupational therapy. Counseling (cognitive behavioral therapy). Good nutrition. Biofeedback. Massage. Meditation. Non-opioid medicine. Following a gentle exercise program. How to use opioid  pain medicine Taking medicine Take your pain medicine exactly as told by your health care provider. Take it only when you need it. If your pain gets less severe, you may take less than your prescribed dose if your health care provider approves. If you are not having  pain, do nottake pain medicine unless your health care provider tells you to take it. If your pain is severe, do nottry to treat it yourself by taking more pills than instructed on your prescription. Contact your health care provider for help. Write down the times when you take your pain medicine. It is easy to become confused while on pain medicine. Writing the time can help you avoid overdose. Take other over-the-counter or prescription medicines only as told by your health care provider. Keeping yourself and others safe  While you are taking opioid pain medicine: Do not drive, use machinery, or power tools. Do not sign legal documents. Do not drink alcohol. Do not take sleeping pills. Do not supervise children by yourself. Do not do activities that require climbing or being in high places. Do not go to a lake, river, ocean, spa, or swimming pool. Do not share your pain medicine with anyone. Keep pain medicine in a locked cabinet or in a secure area where pets and children cannot reach it. Stopping your use of opioids If you have been taking opioid medicine for more than a few weeks, you may need to slowly decrease (taper) how much you take until you stop completely. Tapering your use of opioids can decrease your risk of symptoms of withdrawal, such as: Pain and cramping in the abdomen. Nausea. Sweating. Sleepiness. Restlessness. Uncontrollable shaking (tremors). Cravings for the medicine. Do not attempt to taper your use of opioids on your own. Talk with your health care provider about how to do this. Your health care provider may prescribe a step-down schedule based on how much medicine you are taking and how long you have been taking it. Getting rid of leftover pills Do not save any leftover pills. Get rid of leftover pills safely by: Taking the medicine to a prescription take-back program. This is usually offered by the county or law enforcement. Bringing them to a pharmacy that  has a drug disposal container. Flushing them down the toilet. Check the label or package insert of your medicine to see whether this is safe to do. Throwing them out in the trash. Check the label or package insert of your medicine to see whether this is safe to do. If it is safe to throw it out, remove the medicine from the original container, put it into a sealable bag or container, and mix it with used coffee grounds, food scraps, dirt, or cat litter before putting it in the trash. Follow these instructions at home: Activity Do exercises as told by your health care provider. Avoid activities that make your pain worse. Return to your normal activities as told by your health care provider. Ask your health care provider what activities are safe for you. General instructions You may need to take these actions to prevent or treat constipation: Drink enough fluid to keep your urine pale yellow. Take over-the-counter or prescription medicines. Eat foods that are high in fiber, such as beans, whole grains, and fresh fruits and vegetables. Limit foods that are high in fat and processed sugars, such as fried or sweet foods. Keep all follow-up visits. This is important. Where to find support If you have been taking opioids for a long time,  you may benefit from receiving support for quitting from a local support group or counselor. Ask your health care provider for a referral to these resources in your area. Where to find more information Centers for Disease Control and Prevention (CDC): footballexhibition.com.br [place] Food and Drug Administration (FDA): pumpkinsearch.com.ee Get help right away if: You may have taken too much of an opioid (overdosed). Common symptoms of an overdose: Your breathing is slower or more shallow than normal. You have a very slow heartbeat (pulse). You have slurred speech. You have nausea and vomiting. Your pupils become very small. You have other potential symptoms: You are very confused. You  faint or feel like you will faint. You have cold, clammy skin. You have blue lips or fingernails. You have thoughts of harming yourself or harming others. These symptoms may represent a serious problem that is an emergency. Do not wait to see if the symptoms will go away. Get medical help right away. Call your local emergency services (911 in the [place] Do not drive yourself to the hospital.  If you ever feel like you may hurt yourself or others, or have thoughts about taking your own life, get help right away. Go to your nearest emergency department or: Call your local emergency services (911 in the [place] Call the [facility] ((531)595-7968 in the [place] Call a suicide crisis helpline, such as the National Suicide Prevention Lifeline at 520-071-6518 or 988 in the [place] This is open 24 hours a day in the [place] If youre a Veteran: Call 988 and press 1. This is open 24 hours a day. Text the [employer] at 812-597-2202. Summary Opioid medicines can help you manage moderate to severe pain for a short period of time. A pain treatment plan is an agreement between you and your health care provider. Discuss the goals of your treatment, including how much pain you might expect to have and how you will manage the pain. If you think that you or someone else may have taken too much of an opioid, get medical help right away. This information is not intended to replace advice given to you by your health care provider. Make sure you discuss any questions you have with your health care provider. Document Revised: 08/25/2023 Document Reviewed: 02/28/2021 Elsevier Patient Education  2024 Elsevier Inc.    12/08/2024    3:31 PM  Advanced Directives  Does Patient Have a Medical Advance Directive? No  Would patient like information on creating a medical advance directive? No - Patient declined    Vision: Annual vision screenings are recommended for early detection of glaucoma, cataracts,  and diabetic retinopathy. These exams can also reveal signs of chronic conditions such as diabetes and high blood pressure.  Dental: Annual dental screenings help detect early signs of oral cancer, gum disease, and other conditions linked to overall health, including heart disease and diabetes.  Please see the attached documents for additional preventive care recommendations.

## 2024-12-08 NOTE — Progress Notes (Signed)
 "   Chief Complaint  Patient presents with   Follow-up    Patient reports extreme fatigue stated the iron infusions did not help at all.   Discussed the use of AI scribe software for clinical note transcription with the patient, who gave verbal consent to proceed.  History of Present Illness Donald Roberts is a 74 year old male  with PMHx significant for CAD, cervical myelopathy, chronic pain disorder on opioid treatment, insomnia, and hyperlipidemia here today with his wife c/o persistent fatigue and shortness of breath. He was last seen on 11/08/24. These problems are chronic and have been discussed on prior visits. No new symptoms. Since his last visit he has seen nephrologist and anemia has been treated with iron infusions.  He experiences persistent fatigue that significantly impacts his daily activities. He reports that work up at this point has been negative.  Iron infusions did not help with fatigue.  He has poor sleep, averaging two to three hours per night, primarily due to frequent nocturia occurring every 40 minutes to an hour. He has consulted three urologists who noted thickened bladder walls but rest of examination negative. Recommended treatments have not been effective. He has tried various sleep medications, including trazodone  and Ambien , with Ambien  being the most effective. He now uses a condom catheter to manage nocturia but still wakes up with the urge to urinate.  Reports back pain with hx of six fractures and recently found to have a cyst between L5 and S1, for which he is scheduled to receive an epidural injection on the 19th.   His wife is concerned about DOE. He experiences shortness of breath with minimal exertion, such as walking short distances. He reports negative cardiac work up and at some point he had a spirometry at his pain management office, reported as normal. Former smoker. Lung cancer screening up to date and have been negative. No associated cough or  wheezing. Has not received appt information from pulmonologist's office.  Lab Results  Component Value Date   WBC 8.2 11/08/2024   HGB 13.2 11/08/2024   HCT 39.9 11/08/2024   MCV 86.7 11/08/2024   PLT 230.0 11/08/2024   Lab Results  Component Value Date   CREATININE 1.0 11/09/2024   BUN 19 11/09/2024   NA 140 11/09/2024   K 4.3 11/09/2024   CL 102 11/09/2024   CO2 28 (A) 11/09/2024   Review of Systems  Constitutional:  Negative for activity change, appetite change, chills, fever and unexpected weight change.  HENT:  Negative for sore throat and trouble swallowing.   Cardiovascular:  Negative for chest pain, palpitations and leg swelling.  Gastrointestinal:  Negative for abdominal pain, nausea and vomiting.  Endocrine: Negative for cold intolerance and heat intolerance.  Genitourinary:  Negative for decreased urine volume, dysuria and hematuria.  Musculoskeletal:  Positive for back pain and gait problem.  Skin:  Negative for rash.  Neurological:  Negative for syncope and headaches.  See other pertinent positives and negatives in HPI.  Medications Ordered Prior to Encounter[1]  Past Medical History:  Diagnosis Date   Abnormal gait    Allergic rhinitis, cause unspecified 07/18/2008   Back pain, chronic 10/01/2011   CAD (coronary artery disease)    Carpal tunnel syndrome on left    Chronic low back pain 12/25/2012   Degenerative arthritis of right knee 10/01/2011   Diverticulosis of colon (without mention of hemorrhage) 07/18/2008   Gross hematuria 07/18/2008   Headache(784.0)    Hx: of  Migraines as a child   Hyperlipidemia 07/18/2008   Hypertension    Idiopathic progressive neuropathy    Personal history of colonic polyps 07/18/2008   PVD (peripheral vascular disease)    Tobacco use disorder 04/06/2009   Allergies[2]  Social History   Socioeconomic History   Marital status: Married    Spouse name: Erminio   Number of children: 1   Years of education: Not on  file   Highest education level: 12th grade  Occupational History   Occupation: surveyor, minerals and midwife    Comment: semi retired  Tobacco Use   Smoking status: Former    Current packs/day: 0.50    Average packs/day: 0.5 packs/day for 40.0 years (20.0 ttl pk-yrs)    Types: Cigarettes   Smokeless tobacco: Never  Vaping Use   Vaping status: Never Used  Substance and Sexual Activity   Alcohol use: Yes    Alcohol/week: 2.0 standard drinks of alcohol    Types: 2 Cans of beer per week    Comment: occasional   Drug use: No   Sexual activity: Not Currently  Other Topics Concern   Not on file  Social History Narrative   Married, 1 daughter lives close by.   Spends time at [employer] in [place].   Social Drivers of Health   Tobacco Use: Medium Risk (12/08/2024)   Patient History    Smoking Tobacco Use: Former    Smokeless Tobacco Use: Never    Passive Exposure: Not on file  Financial Resource Strain: Low Risk (12/06/2024)   Overall Financial Resource Strain (CARDIA)    Difficulty of Paying Living Expenses: Not very hard  Food Insecurity: No Food Insecurity (12/06/2024)   Epic    Worried About Programme Researcher, Broadcasting/film/video in the Last Year: Never true    Ran Out of Food in the Last Year: Never true  Transportation Needs: No Transportation Needs (12/06/2024)   Epic    Lack of Transportation (Medical): No    Lack of Transportation (Non-Medical): No  Physical Activity: Inactive (12/06/2024)   Exercise Vital Sign    Days of Exercise per Week: 0 days    Minutes of Exercise per Session: Not on file  Stress: No Stress Concern Present (12/06/2024)   [employer] of Occupational Health - Occupational Stress Questionnaire    Feeling of Stress: Not at all  Social Connections: Moderately Integrated (12/06/2024)   Social Connection and Isolation Panel    Frequency of Communication with Friends and Family: More than three times a week    Frequency of Social Gatherings with Friends and Family: Once a week     Attends Religious Services: 1 to 4 times per year    Active Member of Clubs or Organizations: No    Attends Engineer, Structural: Not on file    Marital Status: Married  Depression (PHQ2-9): Low Risk (11/08/2024)   Depression (PHQ2-9)    PHQ-2 Score: 0  Alcohol Screen: Low Risk (12/05/2023)   Alcohol Screen    Last Alcohol Screening Score (AUDIT): 1  Housing: Low Risk (12/06/2024)   Epic    Unable to Pay for Housing in the Last Year: No    Number of Times Moved in the Last Year: 0    Homeless in the Last Year: No  Utilities: Low Risk (09/23/2024)   Received from [facility]   Utilities    In the past 12 months has the electric, gas, oil, or water  company threatened to shut off services  in your home? : No  Health Literacy: Adequate Health Literacy (12/05/2023)   B1300 Health Literacy    Frequency of need for help with medical instructions: Never    Today's Vitals   12/08/24 1432  BP: 110/78  Pulse: 73  Resp: 16  Temp: 97.9 F (36.6 C)  SpO2: 93%  Weight: 168 lb (76.2 kg)  Height: 5' 9 (1.753 m)   Wt Readings from Last 3 Encounters:  12/08/24 168 lb (76.2 kg)  11/08/24 165 lb (74.8 kg)  11/03/24 165 lb (74.8 kg)   Body mass index is 24.81 kg/m.  Physical Exam Vitals and nursing note reviewed.  Constitutional:      General: He is not in acute distress.    Appearance: He is well-developed.  HENT:     Head: Normocephalic and atraumatic.  Eyes:     Conjunctiva/sclera: Conjunctivae normal.  Cardiovascular:     Rate and Rhythm: Normal rate and regular rhythm.     Heart sounds: No murmur heard. Pulmonary:     Effort: Pulmonary effort is normal. No respiratory distress.     Breath sounds: Normal breath sounds.  Abdominal:     Palpations: Abdomen is soft. There is no mass.     Tenderness: There is no abdominal tenderness.  Lymphadenopathy:     Cervical: No cervical adenopathy.  Skin:    General: Skin is warm.     Findings: No erythema or rash.   Neurological:     General: No focal deficit present.     Mental Status: He is alert and oriented to person, place, and time.     Comments: Mildly unstable gait, not assisted.  Psychiatric:        Mood and Affect: Mood and affect normal.   ASSESSMENT AND PLAN:  Mr. Tarren Velardi was seen today for follow-up.  Diagnoses and all orders for this visit:  Chronic fatigue Assessment & Plan: Problem has been going on for years. We have a long discussion about possible etiologies. A few of his chronic problems can certainly be contributing factors. Pending appt with pulmonologist to discussed sleep study. Explained that a PET scan is not indicated at this time. Improving sleep may help.   Chronic insomnia Assessment & Plan: Aggravated by nocturia. He would like to resume Ambien  , which he felt like helped in the past. He has tried other medications like Trazodone  but they were ineffective. We discussed some side effects of medication. Ambien  10 mg to take 1/2-1 tab at bedtime. Good sleep hygiene. F/U in 3-4 months.  Orders: -     Zolpidem  Tartrate; Take 0.5-1 tablets (5-10 mg total) by mouth at bedtime as needed for sleep.  Dispense: 15 tablet; Refill: 0  DOE (dyspnea on exertion) Chronic. We discussed possible etiologies. He has seen cardiologist. ? COPD, OSA,and deconditioning among some to be considered. Echo 10/2024: LVEF 55-60%. Left cardiac cath in 08/2024: Angiographically no significant disease compared to previous catheterizations.  Pending appt with pulmonologist, contact information provided on AVS. Instructed about about warning signs.  I personally spent a total of 41 minutes in the care of the patient today including preparing to see the patient, getting/reviewing separately obtained history, performing a medically appropriate exam/evaluation, counseling and educating, documenting clinical information in the EHR, independently interpreting results, and communicating  results. Previous lab results discussed and questions answered.  Return in 3 months (on 03/08/2025), or if symptoms worsen or fail to improve, for chronic problems.  Tayvien Kane, MD [facility].  [place] office.     [1]  Current Outpatient Medications on File Prior to Visit  Medication Sig Dispense Refill   aspirin  EC 81 MG tablet Take 81 mg by mouth daily. Swallow whole.     clotrimazole -betamethasone  (LOTRISONE ) cream Apply 1 Application topically 2 (two) times daily.     nitroGLYCERIN  (NITROSTAT ) 0.4 MG SL tablet DISSOLVE 1 TABLET UNDER THE TONGUE EVERY 5 MINUTES FOR UP TO 3 DOSES AS NEEDED FOR CHEST PAIN. IF NO RELIEF AFTER 3 DOSES, CALL 911 OR GO TO ER. 25 tablet 2   oxyCODONE -acetaminophen  (PERCOCET) 10-325 MG tablet Take 1 tablet by mouth every 4 (four) hours as needed for pain.     pregabalin  (LYRICA ) 200 MG capsule Take 200 mg by mouth at bedtime.     rosuvastatin  (CRESTOR ) 40 MG tablet Take 1 tablet (40 mg total) by mouth daily. 90 tablet 0   No current facility-administered medications on file prior to visit.  [2] No Known Allergies  "

## 2024-12-09 ENCOUNTER — Encounter: Payer: Self-pay | Admitting: Family Medicine

## 2024-12-09 NOTE — Assessment & Plan Note (Signed)
 Aggravated by nocturia. He would like to resume Ambien  , which he felt like helped in the past. He has tried other medications like Trazodone  but they were ineffective. We discussed some side effects of medication. Ambien  10 mg to take 1/2-1 tab at bedtime. Good sleep hygiene. F/U in 3-4 months.

## 2024-12-09 NOTE — Assessment & Plan Note (Signed)
 Problem has been going on for years. We have a long discussion about possible etiologies. A few of his chronic problems can certainly be contributing factors. Pending appt with pulmonologist to discussed sleep study. Explained that a PET scan is not indicated at this time. Improving sleep may help.

## 2024-12-17 ENCOUNTER — Encounter: Payer: Self-pay | Admitting: Family Medicine

## 2024-12-27 ENCOUNTER — Ambulatory Visit: Admitting: Pulmonary Disease

## 2025-01-05 ENCOUNTER — Encounter: Payer: Self-pay | Admitting: Internal Medicine

## 2025-01-05 ENCOUNTER — Ambulatory Visit: Admitting: Internal Medicine

## 2025-01-05 VITALS — BP 100/60 | HR 66 | Temp 98.1°F | Ht 70.0 in | Wt 175.0 lb

## 2025-01-05 DIAGNOSIS — R0609 Other forms of dyspnea: Secondary | ICD-10-CM

## 2025-01-05 DIAGNOSIS — J479 Bronchiectasis, uncomplicated: Secondary | ICD-10-CM | POA: Diagnosis not present

## 2025-01-05 DIAGNOSIS — Z87891 Personal history of nicotine dependence: Secondary | ICD-10-CM

## 2025-01-05 DIAGNOSIS — R5382 Chronic fatigue, unspecified: Secondary | ICD-10-CM

## 2025-01-05 DIAGNOSIS — J449 Chronic obstructive pulmonary disease, unspecified: Secondary | ICD-10-CM

## 2025-01-05 DIAGNOSIS — R351 Nocturia: Secondary | ICD-10-CM

## 2025-01-05 DIAGNOSIS — J4489 Other specified chronic obstructive pulmonary disease: Secondary | ICD-10-CM

## 2025-01-05 DIAGNOSIS — G4710 Hypersomnia, unspecified: Secondary | ICD-10-CM

## 2025-01-05 MED ORDER — FLUTICASONE-SALMETEROL 250-50 MCG/ACT IN AEPB: 1.0000 | INHALATION_SPRAY | Freq: Two times a day (BID) | RESPIRATORY_TRACT | 3 refills | Status: AC

## 2025-01-05 NOTE — Patient Instructions (Addendum)
" ° °  Recommend home sleep study to assess for sleep apnea Recommend pulmonary function testing to assess lung function Recommend starting Wixela 2 puffs in the morning 2 puffs at night Rinse mouth after use          "

## 2025-01-05 NOTE — Progress Notes (Unsigned)
 " [facility] [place] Consultation      Date: 01/05/2025,   MRN# [id number] Donald Roberts Jan 30, 1951     CHIEF COMPLAINT:   Assessment shortness of breath, assessment of low energy   HISTORY OF PRESENT ILLNESS  74 year old pleasant white male seen today for multiple medical issues  First issue assessment of low energy Patient is seen today for problems and issues with sleep related to excessive daytime sleepiness Patient  has been having sleep problems for many years Patient has been having excessive daytime sleepiness for a long time Patient has been having extreme fatigue and tiredness, lack of energy Patient has frequent awakenings at night due to creased frequency of urination Patient has seen 3 urologist and seem to have a persistent problem  Second issue is assessment of shortness of breath and dyspnea exertion Patient is a long-term smoker 1 pack a day for 50 years Prior history of COVID infection Quit tobacco abuse December 2022 Findings are concerning for COPD No exacerbation at this time No evidence of heart failure at this time No evidence or signs of infection at this time No respiratory distress No fevers, chills, nausea, vomiting, diarrhea No evidence of lower extremity edema No evidence hemoptysis Patient is currently not on any inhaler therapy We have discussed treatment options with long-acting beta agonist and inhaled corticosteroids    Third issue is low-dose CT scan lung cancer screening program Low-dose CT chest November 2025 No pleural fluid. Moderate centrilobular emphysema. No change in pulmonary nodules of maximum 5.1 mm. Bilateral bronchiectasis   PAST MEDICAL HISTORY   Past Medical History:  Diagnosis Date   Abnormal gait    Allergic rhinitis, cause unspecified 07/18/2008   Back pain, chronic 10/01/2011   CAD (coronary artery disease)    Carpal tunnel syndrome on left    Chronic low back pain 12/25/2012    Degenerative arthritis of right knee 10/01/2011   Diverticulosis of colon (without mention of hemorrhage) 07/18/2008   Gross hematuria 07/18/2008   Headache(784.0)    Hx: of Migraines as a child   Hyperlipidemia 07/18/2008   Hypertension    Idiopathic progressive neuropathy    Personal history of colonic polyps 07/18/2008   PVD (peripheral vascular disease)    Tobacco use disorder 04/06/2009     SURGICAL HISTORY   Past Surgical History:  Procedure Laterality Date   ANTERIOR CERVICAL DECOMP/DISCECTOMY FUSION N/A 09/10/2013   Procedure: ANTERIOR CERVICAL DECOMPRESSION/DISCECTOMY FUSION CERVICAL FOUR-FIVE,CERVICAL FIVE-SIX WITH PLATING AND BONE GRAFT;  Surgeon: Catalina CHRISTELLA Stains, MD;  Location: [facility];  Service: Neurosurgery;  Laterality: N/A;   ANTERIOR CERVICAL DECOMP/DISCECTOMY FUSION N/A 03/14/2015   Procedure: ANTERIOR CERVICAL DECOMPRESSION/DISCECTOMY FUSION  CERVICAL TWO-THREE;  Surgeon: Catalina Stains, MD;  Location: [facility];  Service: Neurosurgery;  Laterality: N/A;   ANTERIOR CERVICAL DECOMP/DISCECTOMY FUSION N/A 10/03/2020   Procedure: Anterior Cervial Discectomy and Fusion Cervical three and cervical four;  Surgeon: Louis Shove, MD;  Location: [facility] OR;  Service: Neurosurgery;  Laterality: N/A;  3C   CARDIAC CATHETERIZATION     COLONOSCOPY     COLONOSCOPY W/ BIOPSIES AND POLYPECTOMY     Hx: of   LEFT HEART CATH AND CORONARY ANGIOGRAPHY N/A 11/04/2018   Procedure: LEFT HEART CATH AND CORONARY ANGIOGRAPHY;  Surgeon: Claudene Pacific, MD;  Location: [facility];  Service: Cardiovascular;  Laterality: N/A;   LEFT HEART CATH AND CORONARY ANGIOGRAPHY N/A 01/15/2023   Procedure: LEFT HEART CATH AND CORONARY ANGIOGRAPHY;  Surgeon: Anner Lenis  W, MD;  Location: [facility] IN[facility] CV [facility];  Service: Cardiovascular;  Laterality: N/A;   LEFT HEART CATH AND CORONARY ANGIOGRAPHY N/A 08/25/2024   Procedure: LEFT HEART CATH AND CORONARY ANGIOGRAPHY;  Surgeon: Anner Alm ORN, MD;  [facility] INVASIVE CV LAB;  Service: Cardiovascular;  Laterality: N/A;   lumbar surgury     dr botero/ns; 3 level fusion per pt   POLYPECTOMY     s/p cervical disease x 2       FAMILY HISTORY   Family History  Problem Relation Age of Onset   Heart disease Mother    Heart attack Father    Heart disease Father    Cancer Sister    Diabetes Sister    Heart disease Sister    Heart disease Brother    Parkinsonism Paternal Uncle    Heart disease Other    Colon cancer Neg Hx    Colon polyps Neg Hx    Esophageal cancer Neg Hx    Rectal cancer Neg Hx    Stomach cancer Neg Hx      SOCIAL HISTORY   Social History[1]   MEDICATIONS    Home Medication:  Current Outpatient Rx   Order #: [id number] Class: Historical Med   Order #: [id number] Class: Historical Med   Order #: [id number] Class: Historical Med   Order #: [id number] Class: Normal   Order #: [id number] Class: Historical Med   Order #: [id number] Class: Historical Med   Order #: [id number] Class: Normal   Order #: [id number] Class: Normal    Current Medication: Current Medications[2]    ALLERGIES   Patient has no known allergies.  BP 100/60   Pulse 66   Temp 98.1 F (36.7 C)   Ht 5' 10 (1.778 m)   Wt 175 lb (79.4 kg)   SpO2 96%   BMI 25.11 kg/m    Review of Systems: Gen:  Denies  fever, sweats, chills weight loss  HEENT: Denies blurred vision, double vision, ear pain, eye pain, hearing loss, nose bleeds, sore throat Cardiac:  No dizziness, chest pain or heaviness, chest tightness,edema, No JVD Resp:   No cough, -sputum production, +shortness of breath,-wheezing, -hemoptysis,  Other:  All other systems negative   Physical Examination:   General Appearance: No distress  EYES PERRLA, EOM intact.   NECK Supple, No JVD Pulmonary: normal breath sounds, No wheezing.  CardiovascularNormal S1,S2.  No m/r/g.   Abdomen: Benign, Soft, non-tender. Neurology UE/LE 5/5 strength,  no focal deficits Ext pulses intact, cap refill intact ALL OTHER ROS ARE NEGATIVE     ASSESSMENT/PLAN   74 year old pleasant white male seen today for multiple medical issues including lack of energy excessive daytime sleepiness with longstanding smoking history with some shortness of breath and dyspnea exertion with a high risk for malignancy is enrolled in the lung cancer screening program  Assessment of low energy and excessive daytime sleepiness Plan to assess with home sleep study Patient does have insomnia due to frequent awakenings due to urination   Assessment of shortness of breath or dyspnea exertion Likely related to underlying diagnosis of COPD and bronchiectasis Assessment pulmonary function testing Will start Wixela therapy Rinse mouth after use   Former smoker High risk for lung cancer Follow-up low-dose CT scan annually     MEDICATION ADJUSTMENTS/LABS AND TESTS ORDERED: Wixela Mouth PFT HST   CURRENT MEDICATIONS REVIEWED AT LENGTH WITH PATIENT TODAY   Patient  satisfied with Plan of action and management. All questions answered   Follow up 3 months  I spent a total of 62 minutes dedicated to the care of this patient on the date of this encounter to include pre-visit review of records, face-to-face time with the patient discussing conditions above, post visit ordering of testing, clinical documentation with the electronic health record, making appropriate referrals as documented, and communicating necessary information to the patient's healthcare team.    The Patient requires high complexity decision making for assessment and support, frequent evaluation and titration of therapies, application of advanced monitoring technologies and extensive interpretation of multiple databases.  Patient satisfied with Plan of action and management. All questions answered    Nickolas Alm Cellar, M.D.  [facility]  Medical Director  [facility] [place]                  [1] Social History Tobacco Use   Smoking status: Former    Current packs/day: 0.50    Average packs/day: 0.5 packs/day for 40.0 years (20.0 ttl pk-yrs)    Types: Cigarettes   Smokeless tobacco: Never  Vaping Use   Vaping status: Never Used  Substance Use Topics   Alcohol use: Yes    Alcohol/week: 2.0 standard drinks of alcohol    Types: 2 Cans of beer per week    Comment: occasional   Drug use: No  [2]  Current Outpatient Medications:    solifenacin (VESICARE) 5 MG tablet, Take 5 mg by mouth daily., Disp: , Rfl:    aspirin  EC 81 MG tablet, Take 81 mg by mouth daily. Swallow whole., Disp: , Rfl:    clotrimazole -betamethasone  (LOTRISONE ) cream, Apply 1 Application topically 2 (two) times daily., Disp: , Rfl:    nitroGLYCERIN  (NITROSTAT ) 0.4 MG SL tablet, DISSOLVE 1 TABLET UNDER THE TONGUE EVERY 5 MINUTES FOR UP TO 3 DOSES AS NEEDED FOR CHEST PAIN. IF NO RELIEF AFTER 3 DOSES, CALL 911 OR GO TO ER., Disp: 25 tablet, Rfl: 2   oxyCODONE -acetaminophen  (PERCOCET) 10-325 MG tablet, Take 1 tablet by mouth every 4 (four) hours as needed for pain., Disp: , Rfl:    pregabalin  (LYRICA ) 200 MG capsule, Take 200 mg by mouth at bedtime., Disp: , Rfl:    rosuvastatin  (CRESTOR ) 40 MG tablet, Take 1 tablet (40 mg total) by mouth daily., Disp: 90 tablet, Rfl: 0   zolpidem  (AMBIEN ) 10 MG tablet, Take 0.5-1 tablets (5-10 mg total) by mouth at bedtime as needed for sleep., Disp: 15 tablet, Rfl: 0 "

## 2025-04-18 ENCOUNTER — Ambulatory Visit: Admitting: Internal Medicine

## 2025-04-18 ENCOUNTER — Encounter

## 2025-12-14 ENCOUNTER — Ambulatory Visit
